# Patient Record
Sex: Female | Born: 1945 | Race: White | Hispanic: No | Smoking: Former smoker
Health system: Southern US, Community
[De-identification: ages and names within clinical notes are randomized; demographics above are authoritative.]

## PROBLEM LIST (undated history)

## (undated) ENCOUNTER — Emergency Department (HOSPITAL_COMMUNITY): Admission: EM | Payer: Medicare HMO | Source: Home / Self Care

## (undated) DIAGNOSIS — R42 Dizziness and giddiness: Secondary | ICD-10-CM

## (undated) DIAGNOSIS — C801 Malignant (primary) neoplasm, unspecified: Secondary | ICD-10-CM

## (undated) DIAGNOSIS — E785 Hyperlipidemia, unspecified: Secondary | ICD-10-CM

## (undated) HISTORY — PX: CARPAL TUNNEL RELEASE: SHX101

## (undated) HISTORY — PX: REPLACEMENT TOTAL HIP W/  RESURFACING IMPLANTS: SUR1222

---

## 2015-06-09 DIAGNOSIS — D126 Benign neoplasm of colon, unspecified: Secondary | ICD-10-CM | POA: Insufficient documentation

## 2015-06-09 DIAGNOSIS — R7301 Impaired fasting glucose: Secondary | ICD-10-CM | POA: Insufficient documentation

## 2015-06-09 DIAGNOSIS — K589 Irritable bowel syndrome without diarrhea: Secondary | ICD-10-CM | POA: Insufficient documentation

## 2015-06-09 DIAGNOSIS — M8580 Other specified disorders of bone density and structure, unspecified site: Secondary | ICD-10-CM | POA: Insufficient documentation

## 2015-06-09 DIAGNOSIS — E785 Hyperlipidemia, unspecified: Secondary | ICD-10-CM | POA: Diagnosis present

## 2015-06-09 DIAGNOSIS — L309 Dermatitis, unspecified: Secondary | ICD-10-CM | POA: Insufficient documentation

## 2015-06-09 DIAGNOSIS — M5137 Other intervertebral disc degeneration, lumbosacral region: Secondary | ICD-10-CM | POA: Insufficient documentation

## 2015-06-09 DIAGNOSIS — E559 Vitamin D deficiency, unspecified: Secondary | ICD-10-CM | POA: Insufficient documentation

## 2015-06-09 DIAGNOSIS — G5600 Carpal tunnel syndrome, unspecified upper limb: Secondary | ICD-10-CM | POA: Insufficient documentation

## 2015-06-09 DIAGNOSIS — K5730 Diverticulosis of large intestine without perforation or abscess without bleeding: Secondary | ICD-10-CM | POA: Insufficient documentation

## 2015-06-09 HISTORY — DX: Vitamin D deficiency, unspecified: E55.9

## 2015-08-21 DIAGNOSIS — M1710 Unilateral primary osteoarthritis, unspecified knee: Secondary | ICD-10-CM | POA: Insufficient documentation

## 2015-09-06 DIAGNOSIS — M5431 Sciatica, right side: Secondary | ICD-10-CM | POA: Insufficient documentation

## 2016-08-01 DIAGNOSIS — Z96649 Presence of unspecified artificial hip joint: Secondary | ICD-10-CM | POA: Insufficient documentation

## 2016-12-01 DIAGNOSIS — L570 Actinic keratosis: Secondary | ICD-10-CM | POA: Insufficient documentation

## 2017-03-06 DIAGNOSIS — J301 Allergic rhinitis due to pollen: Secondary | ICD-10-CM | POA: Insufficient documentation

## 2018-02-02 DIAGNOSIS — R42 Dizziness and giddiness: Secondary | ICD-10-CM | POA: Insufficient documentation

## 2018-02-02 DIAGNOSIS — K2100 Gastro-esophageal reflux disease with esophagitis, without bleeding: Secondary | ICD-10-CM | POA: Diagnosis present

## 2019-02-15 DIAGNOSIS — M25511 Pain in right shoulder: Secondary | ICD-10-CM | POA: Insufficient documentation

## 2021-09-10 ENCOUNTER — Encounter (HOSPITAL_BASED_OUTPATIENT_CLINIC_OR_DEPARTMENT_OTHER): Payer: Self-pay | Admitting: Emergency Medicine

## 2021-09-10 ENCOUNTER — Emergency Department (HOSPITAL_BASED_OUTPATIENT_CLINIC_OR_DEPARTMENT_OTHER)
Admission: EM | Admit: 2021-09-10 | Discharge: 2021-09-10 | Disposition: A | Discharge status: Home / Self Care | Payer: Medicare HMO | Attending: Emergency Medicine | Admitting: Emergency Medicine

## 2021-09-10 ENCOUNTER — Emergency Department (HOSPITAL_BASED_OUTPATIENT_CLINIC_OR_DEPARTMENT_OTHER): Payer: Medicare HMO

## 2021-09-10 DIAGNOSIS — N2889 Other specified disorders of kidney and ureter: Secondary | ICD-10-CM | POA: Diagnosis not present

## 2021-09-10 DIAGNOSIS — M545 Low back pain, unspecified: Secondary | ICD-10-CM | POA: Diagnosis present

## 2021-09-10 DIAGNOSIS — M899 Disorder of bone, unspecified: Secondary | ICD-10-CM | POA: Insufficient documentation

## 2021-09-10 DIAGNOSIS — M5442 Lumbago with sciatica, left side: Secondary | ICD-10-CM | POA: Insufficient documentation

## 2021-09-10 DIAGNOSIS — R634 Abnormal weight loss: Secondary | ICD-10-CM | POA: Insufficient documentation

## 2021-09-10 DIAGNOSIS — R630 Anorexia: Secondary | ICD-10-CM | POA: Diagnosis not present

## 2021-09-10 DIAGNOSIS — R1084 Generalized abdominal pain: Secondary | ICD-10-CM

## 2021-09-10 HISTORY — DX: Hyperlipidemia, unspecified: E78.5

## 2021-09-10 HISTORY — DX: Dizziness and giddiness: R42

## 2021-09-10 LAB — COMPREHENSIVE METABOLIC PANEL
ALT: 22 U/L (ref 0–44)
AST: 31 U/L (ref 15–41)
Albumin: 3.3 g/dL — ABNORMAL LOW (ref 3.5–5.0)
Alkaline Phosphatase: 205 U/L — ABNORMAL HIGH (ref 38–126)
Anion gap: 10 (ref 5–15)
BUN: 18 mg/dL (ref 8–23)
CO2: 28 mmol/L (ref 22–32)
Calcium: 11.6 mg/dL — ABNORMAL HIGH (ref 8.9–10.3)
Chloride: 97 mmol/L — ABNORMAL LOW (ref 98–111)
Creatinine, Ser: 0.81 mg/dL (ref 0.44–1.00)
GFR, Estimated: >60 mL/min (ref >60)
Glucose, Bld: 132 mg/dL — ABNORMAL HIGH (ref 70–99)
Potassium: 3.7 mmol/L (ref 3.5–5.1)
Sodium: 135 mmol/L (ref 135–145)
Total Bilirubin: 0.6 mg/dL (ref 0.3–1.2)
Total Protein: 7.6 g/dL (ref 6.5–8.1)

## 2021-09-10 LAB — URINALYSIS, ROUTINE W REFLEX MICROSCOPIC
Bilirubin Urine: NEGATIVE
Glucose, UA: NEGATIVE mg/dL
Hgb urine dipstick: NEGATIVE
Ketones, ur: NEGATIVE mg/dL
Leukocytes,Ua: NEGATIVE
Nitrite: NEGATIVE
Protein, ur: NEGATIVE mg/dL
Specific Gravity, Urine: >=1.03 (ref 1.005–1.030)
pH: 5.5 (ref 5.0–8.0)

## 2021-09-10 LAB — CBC WITH DIFFERENTIAL/PLATELET
Abs Immature Granulocytes: 0.05 10*3/uL (ref 0.00–0.07)
Basophils Absolute: 0 10*3/uL (ref 0.0–0.1)
Basophils Relative: 0 %
Eosinophils Absolute: 0.1 10*3/uL (ref 0.0–0.5)
Eosinophils Relative: 1 %
HCT: 34.6 % — ABNORMAL LOW (ref 36.0–46.0)
Hemoglobin: 10.8 g/dL — ABNORMAL LOW (ref 12.0–15.0)
Immature Granulocytes: 1 %
Lymphocytes Relative: 13 %
Lymphs Abs: 1.4 10*3/uL (ref 0.7–4.0)
MCH: 24.7 pg — ABNORMAL LOW (ref 26.0–34.0)
MCHC: 31.2 g/dL (ref 30.0–36.0)
MCV: 79.2 fL — ABNORMAL LOW (ref 80.0–100.0)
Monocytes Absolute: 0.4 10*3/uL (ref 0.1–1.0)
Monocytes Relative: 4 %
Neutro Abs: 8.9 10*3/uL — ABNORMAL HIGH (ref 1.7–7.7)
Neutrophils Relative %: 81 %
Platelets: 400 10*3/uL (ref 150–400)
RBC: 4.37 MIL/uL (ref 3.87–5.11)
RDW: 13.5 % (ref 11.5–15.5)
WBC: 10.9 10*3/uL — ABNORMAL HIGH (ref 4.0–10.5)
nRBC: 0 % (ref 0.0–0.2)

## 2021-09-10 LAB — LIPASE, BLOOD: Lipase: 20 U/L (ref 11–51)

## 2021-09-10 IMAGING — US US PELVIS COMPLETE TRANSABD/TRANSVAG W DUPLEX AND/OR DOPPLER
1 series · 13 of 25 positions shown · non-contrast
Comparison: None Available.

CLINICAL DATA: Abnormal uterus on recent CT

EXAM:
TRANSABDOMINAL AND TRANSVAGINAL ULTRASOUND OF PELVIS
DOPPLER ULTRASOUND OF OVARIES
TECHNIQUE: Both transabdominal and transvaginal ultrasound examinations of the
pelvis were performed. Transabdominal technique was performed for
global imaging of the pelvis including uterus, ovaries, adnexal
regions, and pelvic cul-de-sac.
It was necessary to proceed with endovaginal exam following the
transabdominal exam to visualize the ovaries. Color and duplex
Doppler ultrasound was utilized to evaluate blood flow to the
ovaries.

[Series 1: us pelvis complete transabd/transvag w duplex and/ · 13 of 71 slices shown]
[im 1/71]
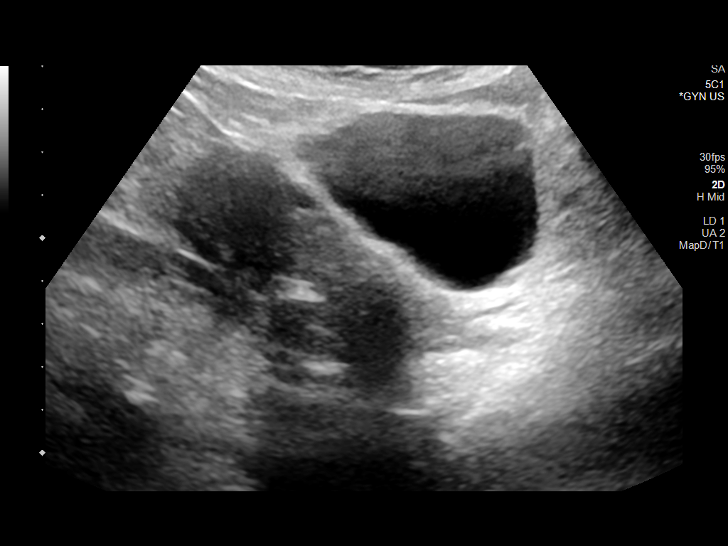
[im 6/71]
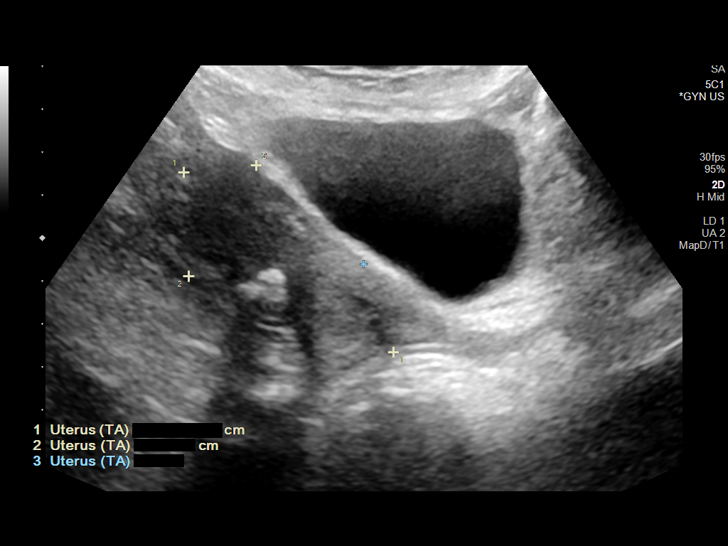
[im 12/71]
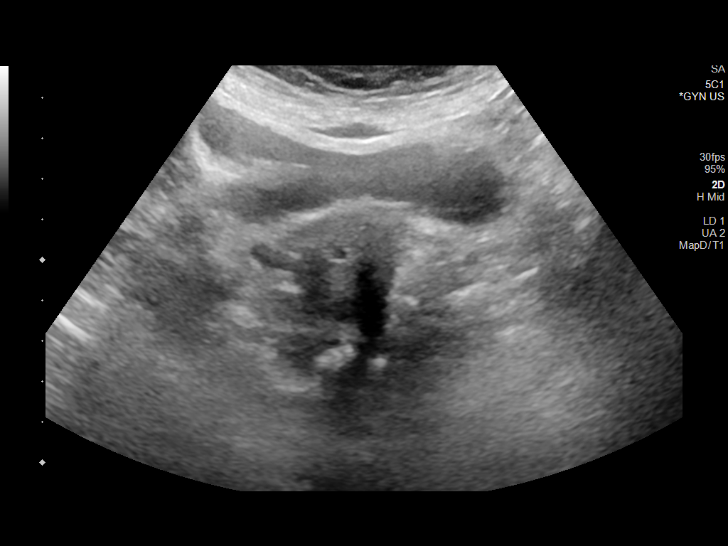
[im 18/71]
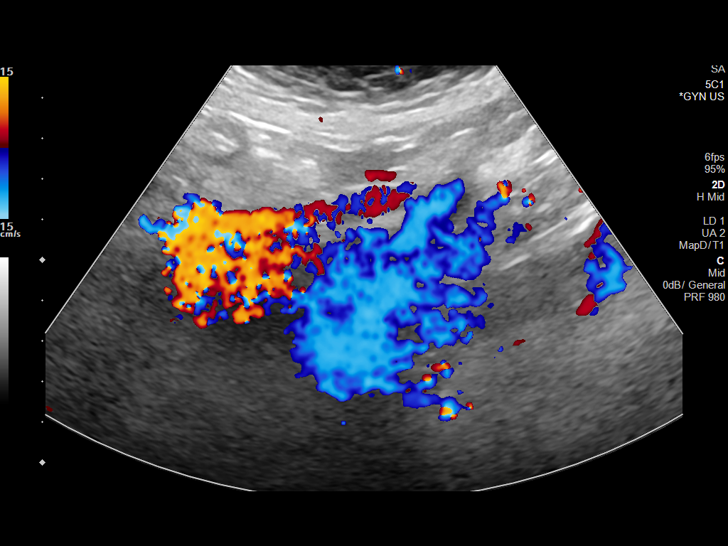
[im 24/71]
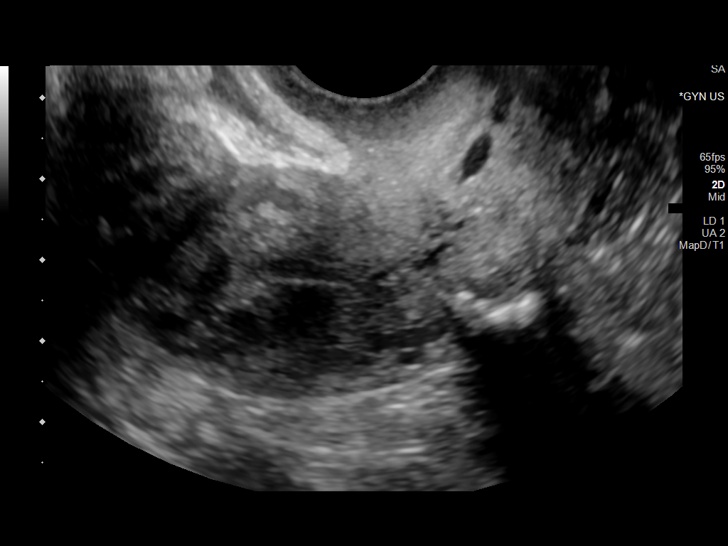
[im 30/71]
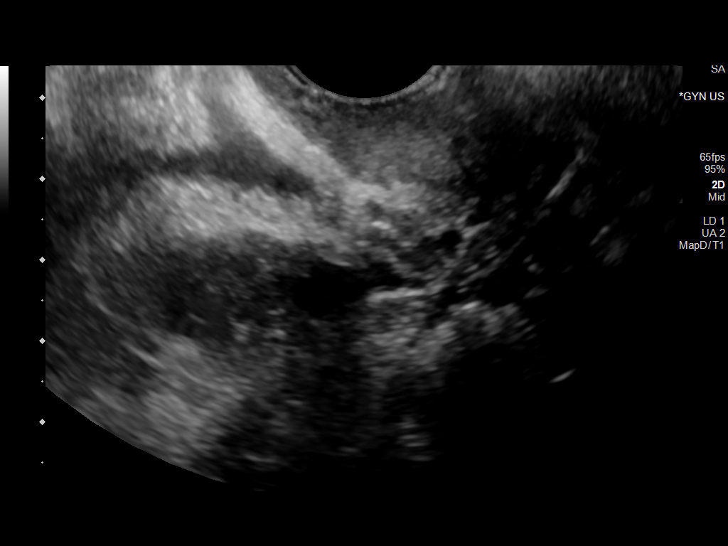
[im 36/71]
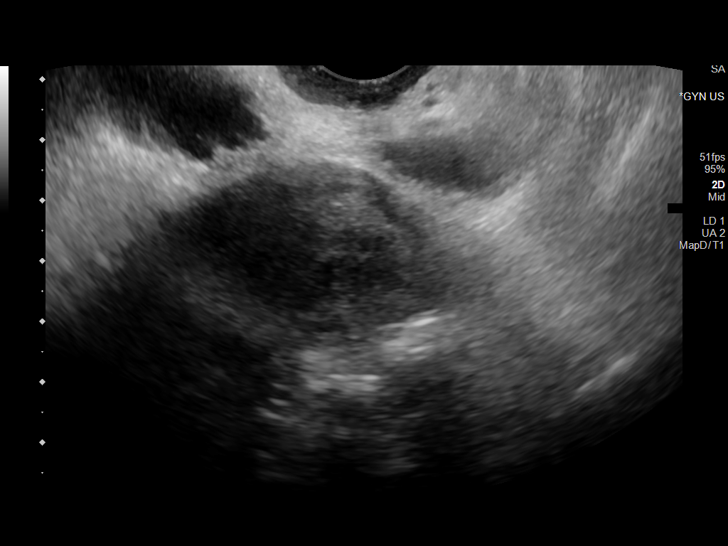
[im 41/71]
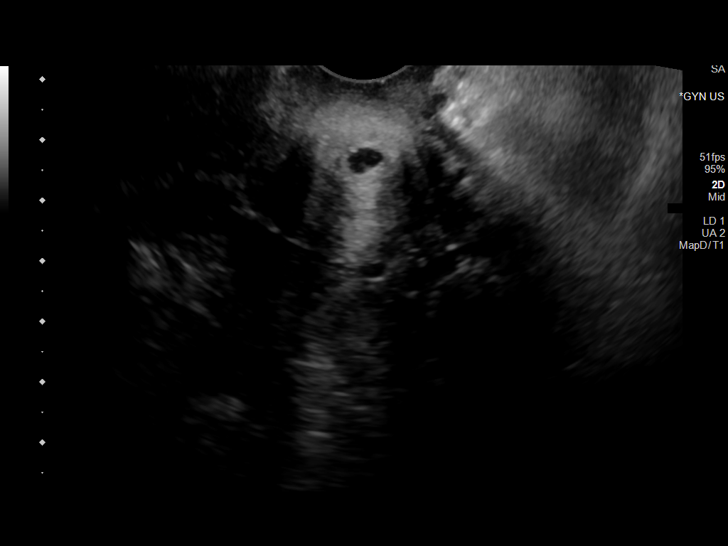
[im 47/71]
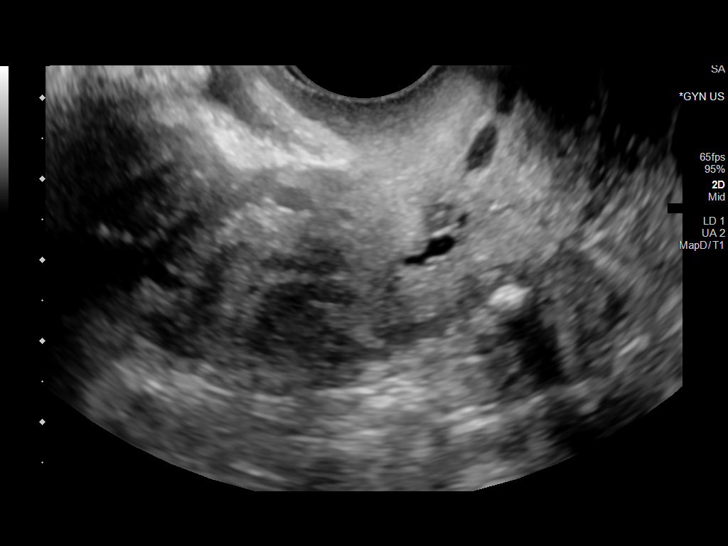
[im 53/71]
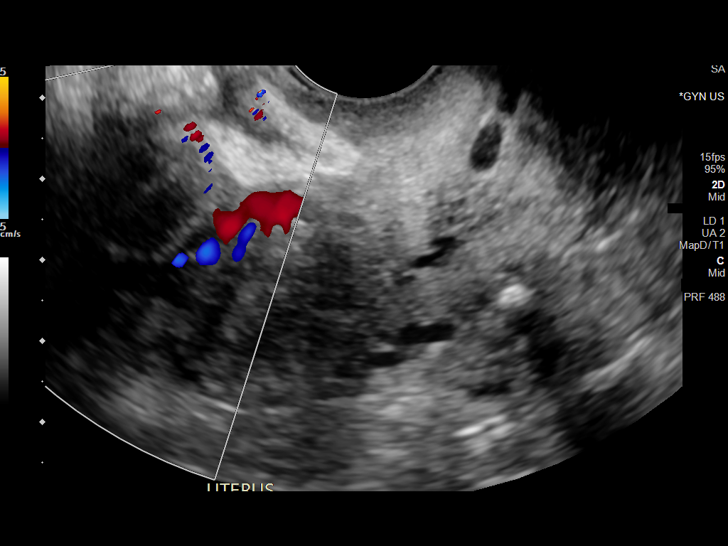
[im 59/71]
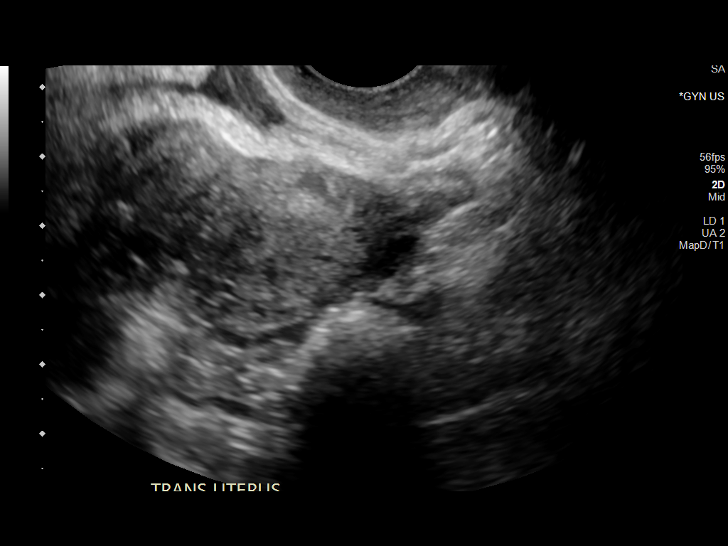
[im 65/71]
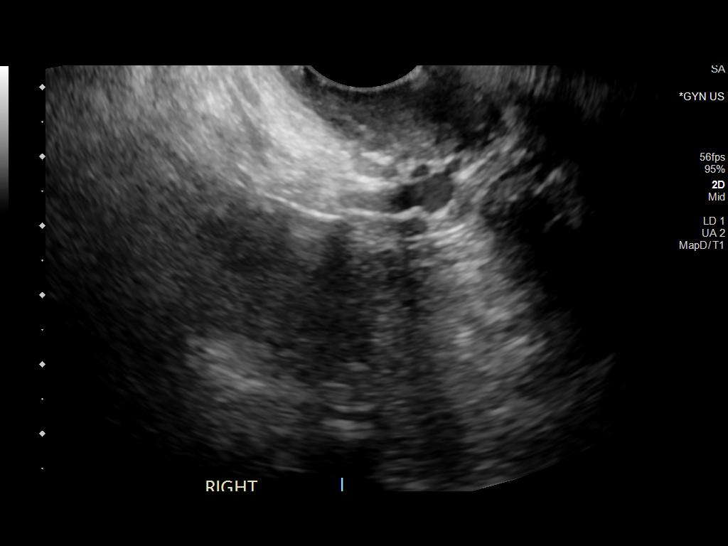
[im 71/71]
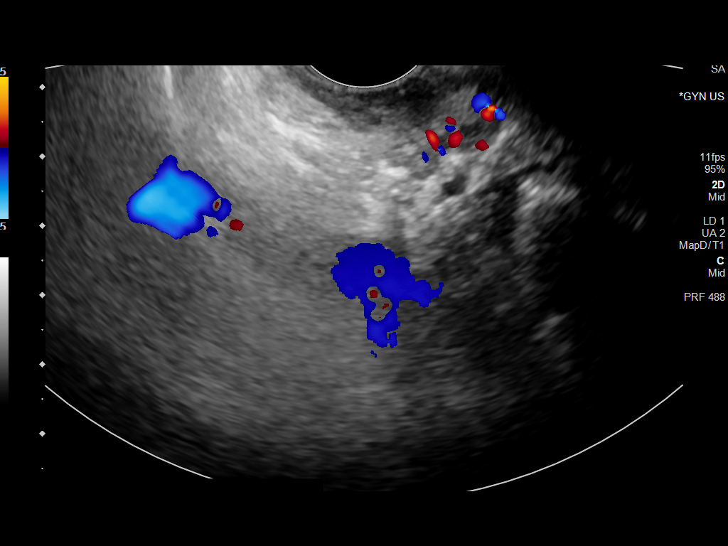

[13 of 25 positions shown; findings below may reference images not displayed]

FINDINGS: Uterus

Measurements: 5.9 x 2.9 x 5.0 cm. = volume: 45 mL. Diffusely
heterogeneous with calcified lesions consistent with small fibroids.
Largest of these measures 2.5 cm in the fundus.

Endometrium

Thickness: 4 mm. No discrete abnormality to correspond with the hypo
dense region seen on recent CT.

Right ovary

Not visualized

Left ovary

Not visualized

Pulsed Doppler evaluation of both ovaries demonstrates normal
low-resistance arterial and venous waveforms.

Other findings

No abnormal free fluid.
IMPRESSION: Uterine fibroid change.

No endometrial abnormality is identified to correspond with that
seen on recent CT examination

.

## 2021-09-10 IMAGING — DX DG CHEST 2V
2 series · 2 of 2 positions shown · non-contrast
Comparison: None Available.

CLINICAL DATA: Abdominal pain, decreased appetite

EXAM:
CHEST - 2 VIEW

[chest pa]
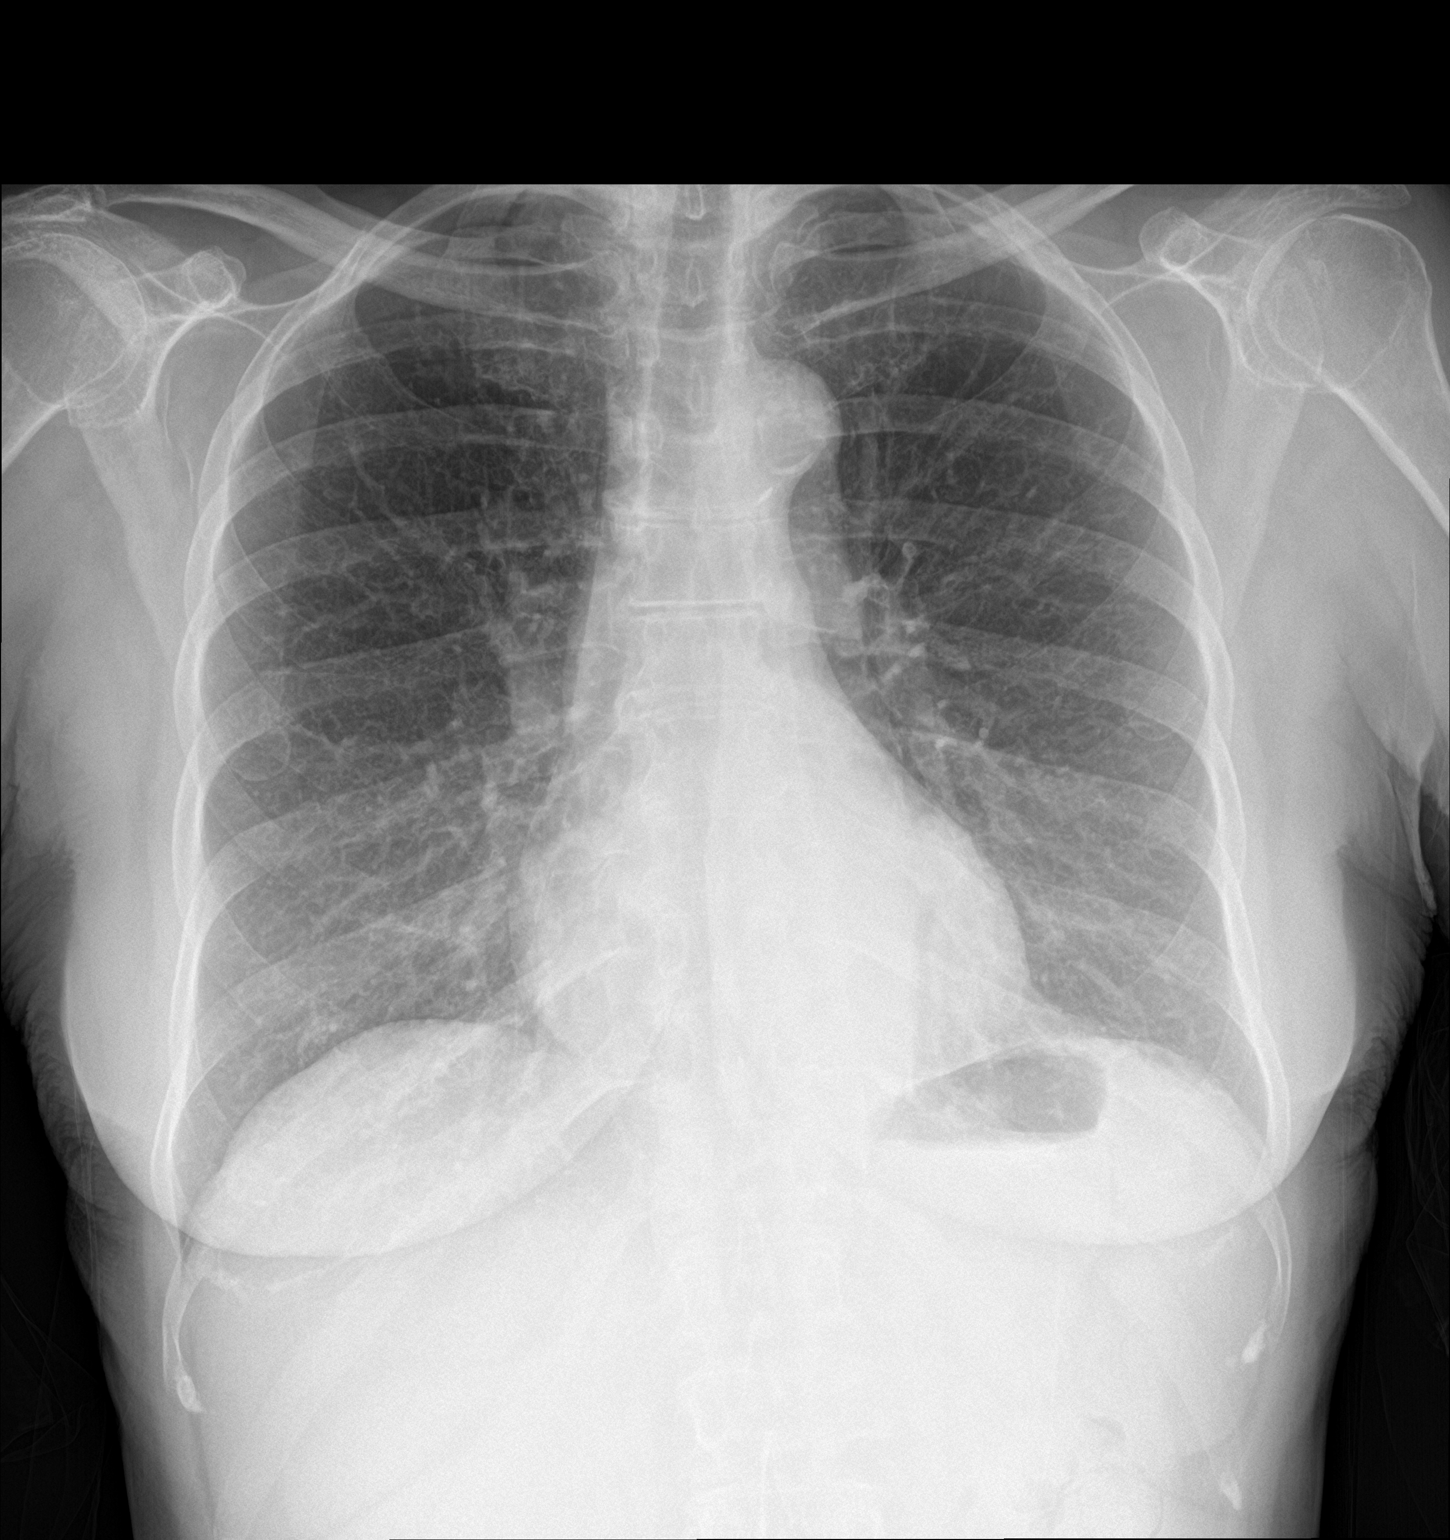

[chest lat]
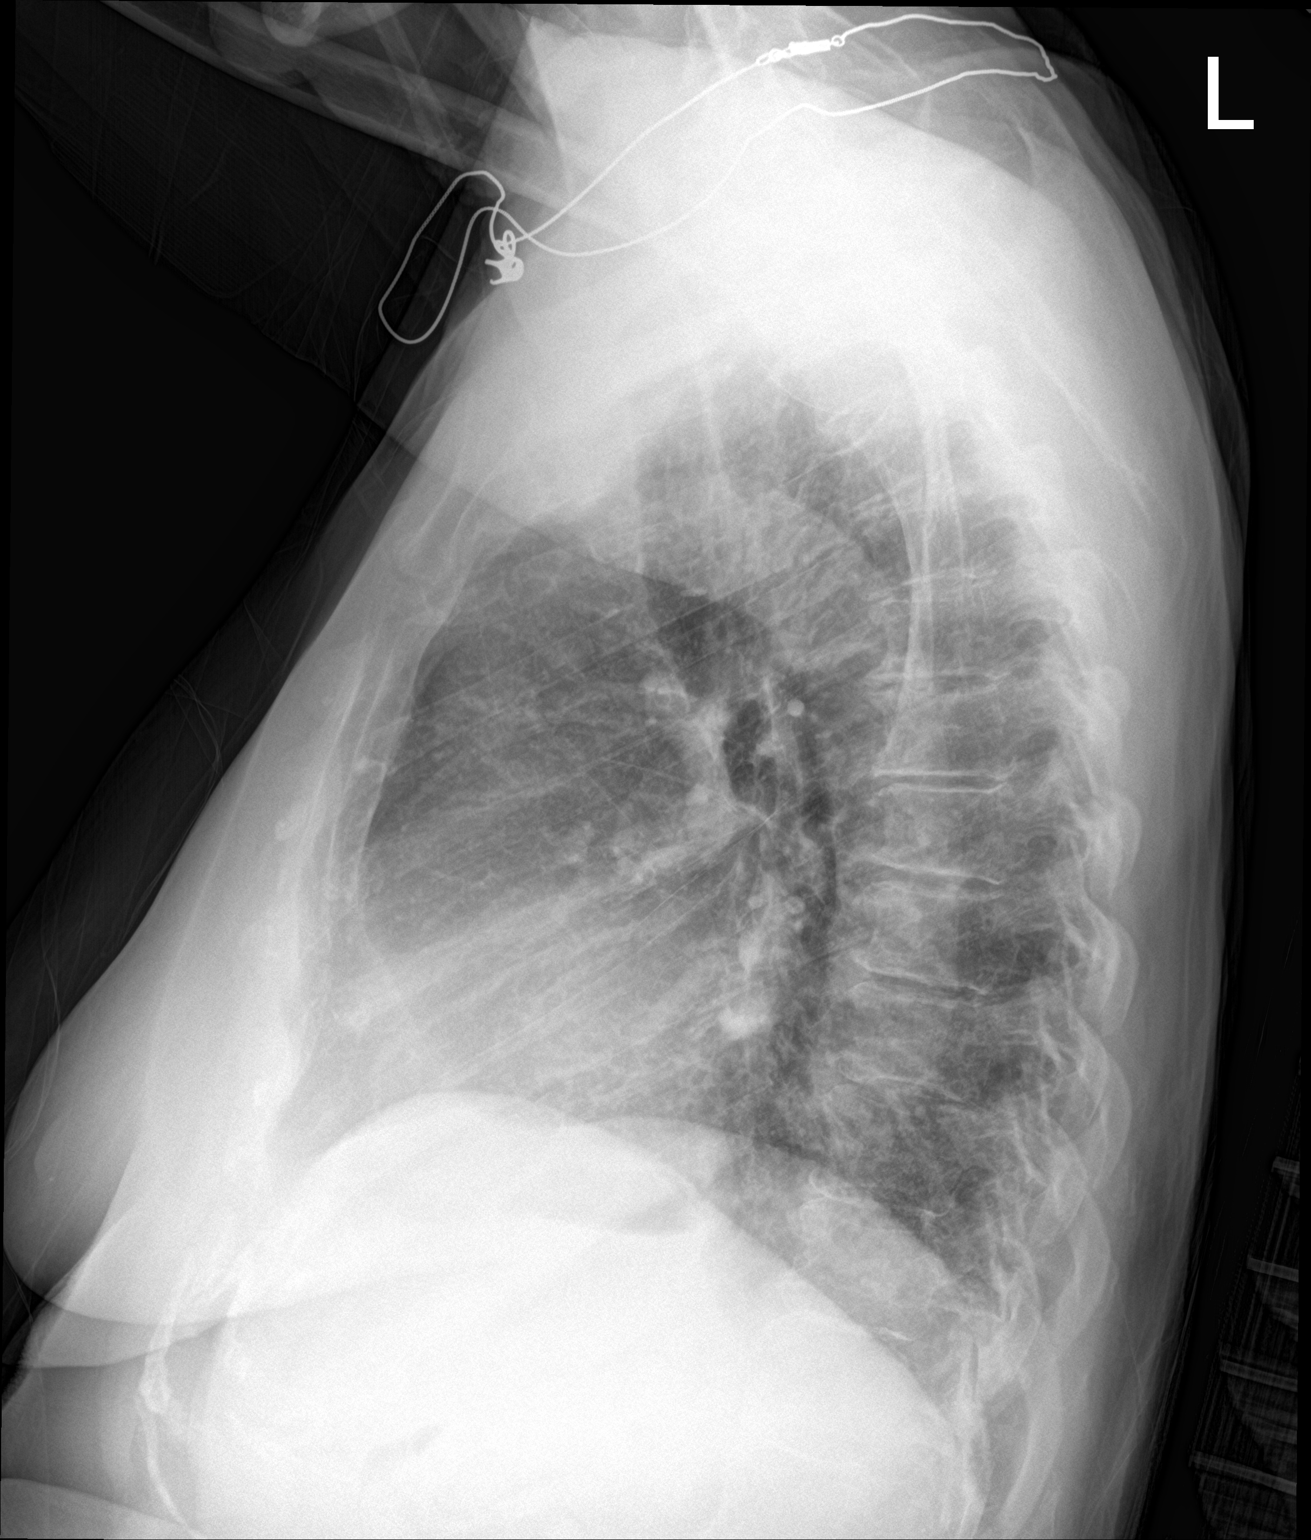

[2 of 2 positions shown; findings below may reference images not displayed]

FINDINGS: The heart size and mediastinal contours are within normal limits.
Both lungs are clear. The visualized skeletal structures are
unremarkable.
IMPRESSION: No active cardiopulmonary disease.

## 2021-09-10 IMAGING — CT CT ABD-PELV W/ CM
2 of 5 series · 14 of 46 positions shown, 16 images · IV contrast (Omnipaque)
Comparison: None Available.

CLINICAL DATA: Severe abdominal pain and nausea.

* Tracking Code: BO *
EXAM:
CT ABDOMEN AND PELVIS WITH CONTRAST
TECHNIQUE: Multidetector CT imaging of the abdomen and pelvis was performed
using the standard protocol following bolus administration of
intravenous contrast.

[Series 3: axial (person_name) (person_name) · axial · 0.86mm/px · z∈[-566,-142]mm · 11 of 97 slices shown, 13 images]
[im 6/97  soft-tissue]
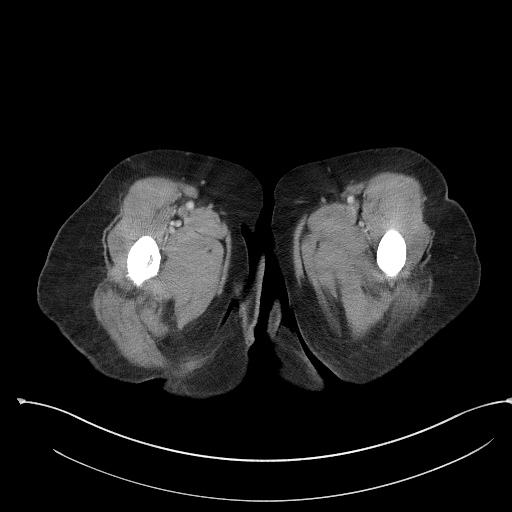
[im 6/97  bone]
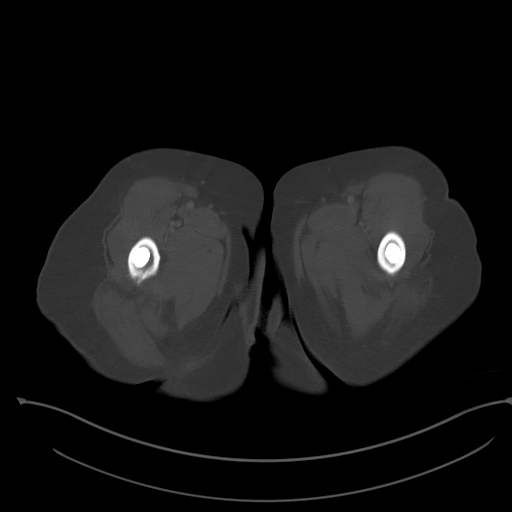
[im 16/97  soft-tissue]
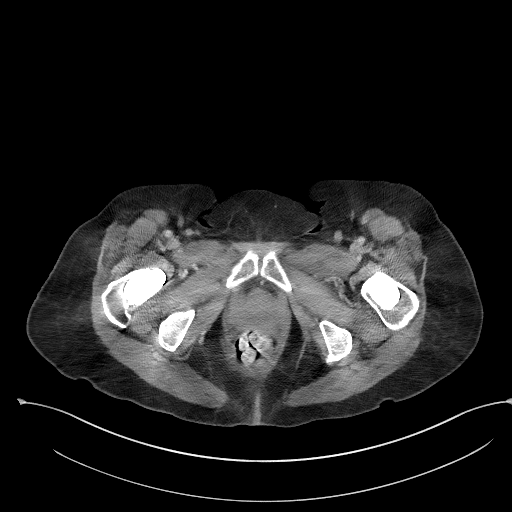
[im 26/97  soft-tissue]
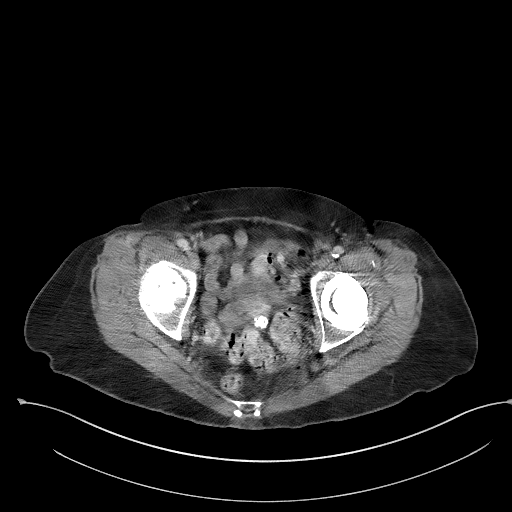
[im 31/97  soft-tissue]
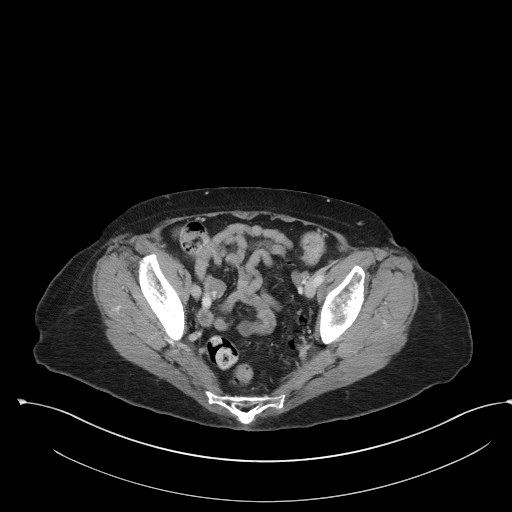
[im 41/97  soft-tissue]
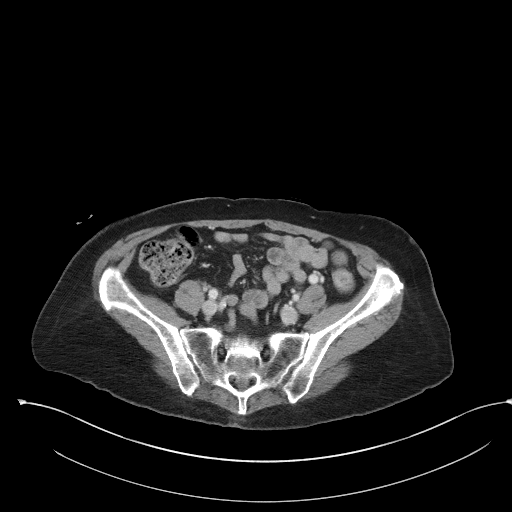
[im 51/97  soft-tissue]
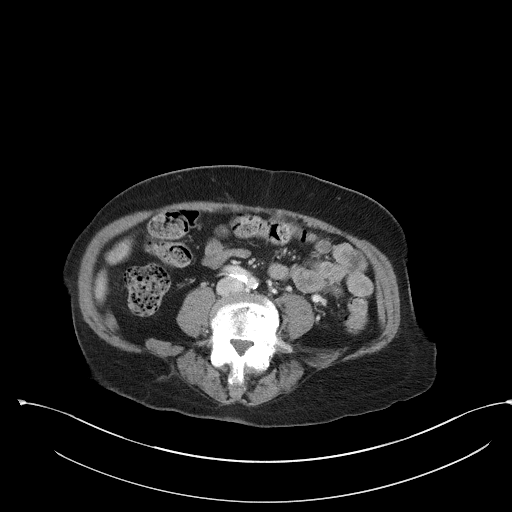
[im 56/97  soft-tissue]
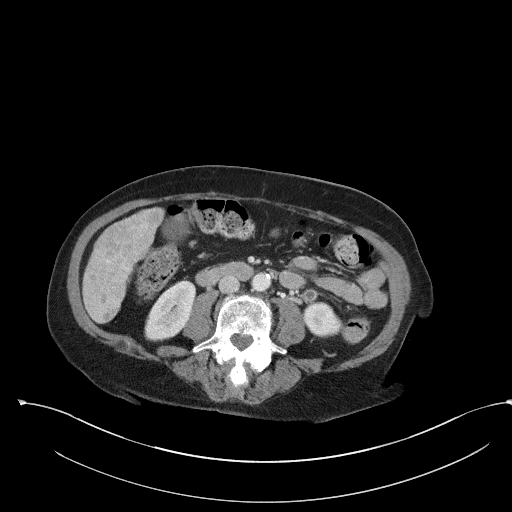
[im 66/97  soft-tissue]
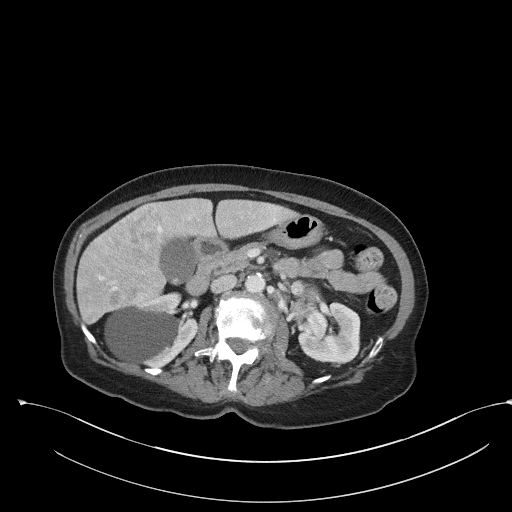
[im 71/97  soft-tissue]
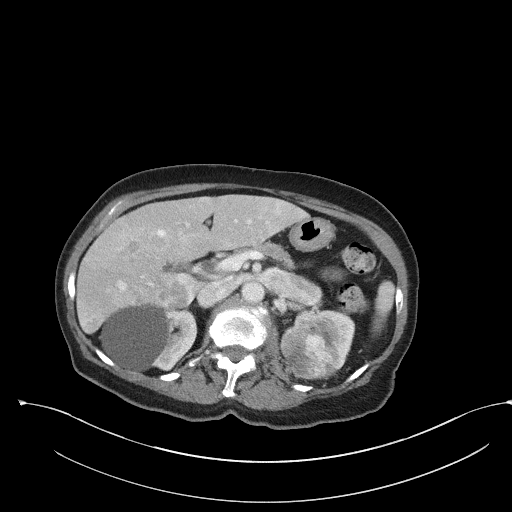
[im 71/97  bone]
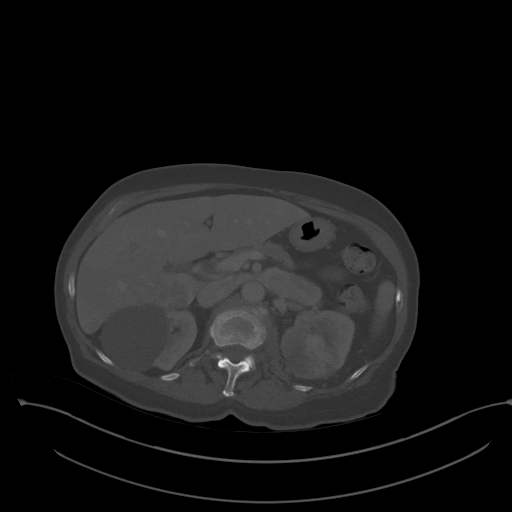
[im 81/97  soft-tissue]
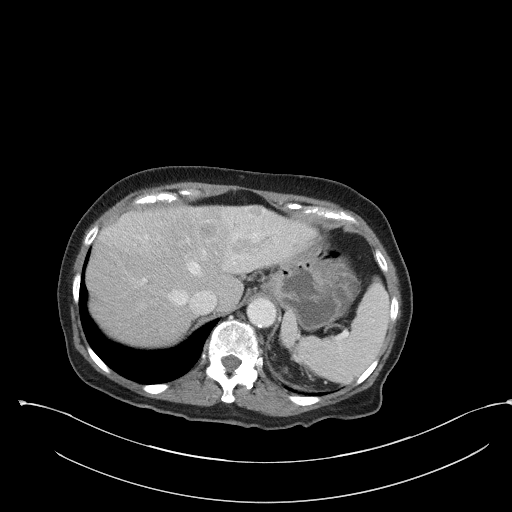
[im 91/97  soft-tissue]
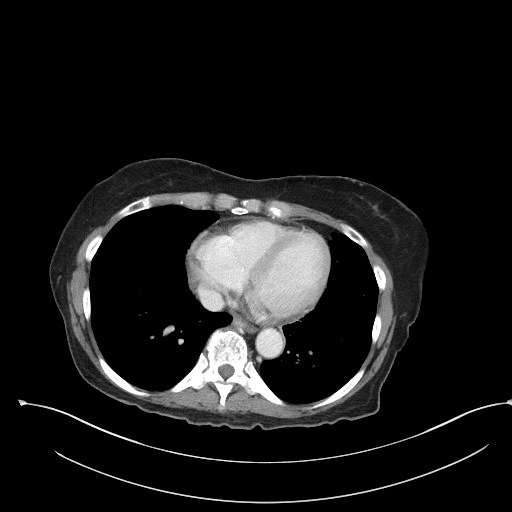

[Series 6: coronal st · coronal · 0.64mm/px · 3 of 98 slices shown]
[im 33/98  soft-tissue]
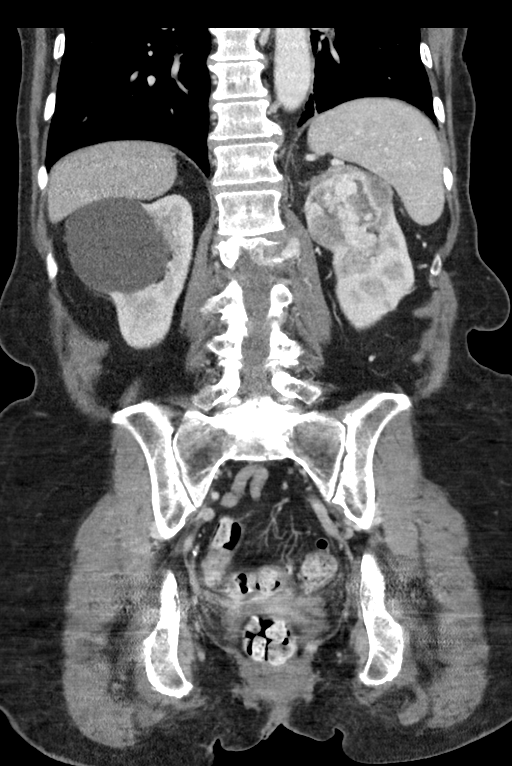
[im 44/98  soft-tissue]
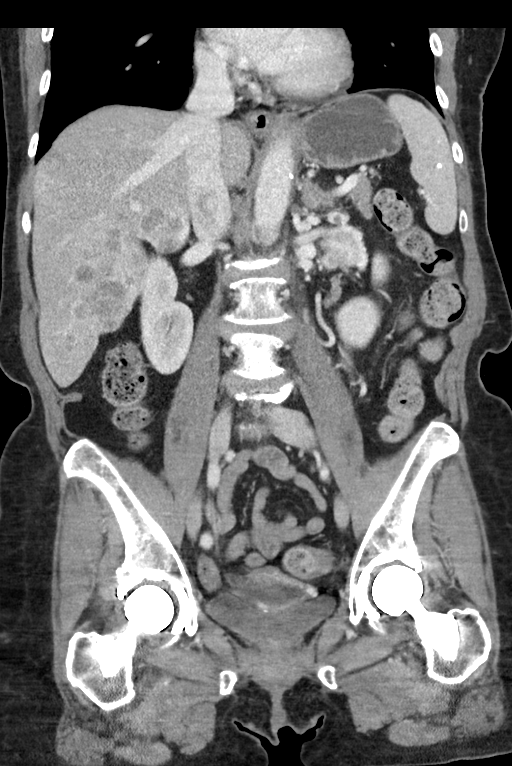
[im 54/98  soft-tissue]
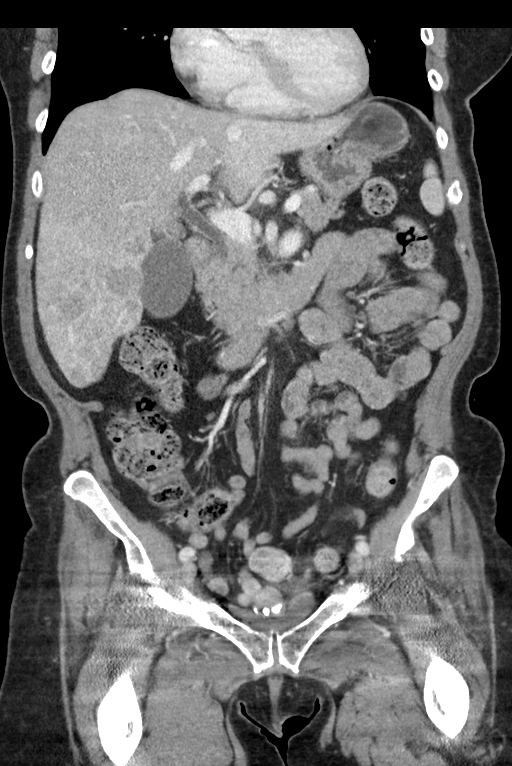

[14 of 46 positions shown; findings below may reference images not displayed]

RADIATION DOSE REDUCTION: This exam was performed according to the
departmental dose-optimization program which includes automated
exposure control, adjustment of the mA and/or kV according to
patient size and/or use of iterative reconstruction technique.

CONTRAST:  100mL OMNIPAQUE IOHEXOL 300 MG/ML  SOLN
FINDINGS: Lower Chest: No acute findings.

Hepatobiliary: Multiple hypovascular masses with peripheral rim
enhancement are seen throughout the liver, consistent with diffuse
liver metastases. One index inferior right hepatic lobe measures
x 2.7 cm on image 37/3. Another index lesion in the left hepatic
lobe measures 2.6 x 2.2 cm on image [DATE].

Small calcified gallstone is noted, however there is no evidence of
cholecystitis or biliary ductal dilatation.

Pancreas:  No mass or inflammatory changes.

Spleen: Within normal limits in size and appearance.

Adrenals/Urinary Tract: 5.7 x 5.8 cm heterogeneously enhancing mass
in upper pole of left kidney, consistent with renal cell carcinoma.
Soft tissue nodules are seen within a left posterior perinephric
space, largest measuring 2.0 x 1.4 cm. Tumor thrombus is also seen
extending into the left renal vein which extends to the midline.
Mild nonenhancing thrombus seen in the distal left gonadal vein. No
evidence of IVC tumor thrombus. 6.6 cm benign-appearing right renal
cyst also noted.

Stomach/Bowel: No evidence of obstruction, inflammatory process or
abnormal fluid collections. Diverticulosis is seen mainly involving
the sigmoid colon, however there is no evidence of diverticulitis.

Vascular/Lymphatic: No pathologically enlarged lymph nodes. No acute
vascular findings. Aortic atherosclerotic calcification incidentally
noted.

Reproductive: Image resolution is limited by artifact from bilateral
hip prostheses. Several small calcified uterine fibroids are noted.
Central low attenuation is also seen within the endometrial cavity
may represent diffuse endometrial thickening.

Other:  None.

Musculoskeletal: Lytic bone lesion involving L2 vertebral body and
pedicle, consistent with bone metastasis
IMPRESSION: 5.8 cm left upper pole renal mass, consistent with renal cell
carcinoma. Adjacent soft tissue nodules within a left posterior
perinephric space.

Tumor thrombus in the left renal vein, with mild bland thrombus in
distal left gonadal vein.

Diffuse liver metastases.

Lytic bone metastasis involving the L2 vertebra.

Cholelithiasis. No radiographic evidence of cholecystitis.

Colonic diverticulosis, without radiographic evidence of
diverticulitis.

Small calcified uterine fibroids.

Possible diffuse endometrial thickening although visualization
limited by artifact from hip prostheses. Endometrial carcinoma
cannot be excluded in a postmenopausal patient. Recommend
transvaginal pelvic ultrasound for further evaluation.

Aortic Atherosclerosis ([5I]-[5I]).

## 2021-09-10 MED ORDER — SODIUM CHLORIDE 0.9 % IV BOLUS
1000.0000 mL | Freq: Once | INTRAVENOUS | Status: AC
  Administered 2021-09-10: 1000 mL via INTRAVENOUS

## 2021-09-10 MED ORDER — SODIUM CHLORIDE 0.9 % IV SOLN: INTRAVENOUS | Status: DC

## 2021-09-10 MED ORDER — HYDROMORPHONE HCL 1 MG/ML IJ SOLN
1.0000 mg | Freq: Once | INTRAMUSCULAR | Status: AC
  Administered 2021-09-10: 1 mg via INTRAVENOUS
  Filled 2021-09-10: qty 1

## 2021-09-10 MED ORDER — ONDANSETRON 4 MG PO TBDP
4.0000 mg | ORAL_TABLET | Freq: Once | ORAL | Status: DC | PRN
Start: 2021-09-10 — End: 2021-09-11

## 2021-09-10 MED ORDER — IOHEXOL 300 MG/ML  SOLN
100.0000 mL | Freq: Once | INTRAMUSCULAR | Status: AC | PRN
  Administered 2021-09-10: 100 mL via INTRAVENOUS

## 2021-09-10 MED ORDER — HYDROCODONE-ACETAMINOPHEN 5-325 MG PO TABS: 1.0000 | ORAL_TABLET | Freq: Four times a day (QID) | ORAL | 0 refills | Status: DC | PRN

## 2021-09-10 MED ORDER — ONDANSETRON 4 MG PO TBDP
4.0000 mg | ORAL_TABLET | Freq: Once | ORAL | Status: AC | PRN
  Administered 2021-09-10: 4 mg via ORAL
  Filled 2021-09-10: qty 1

## 2021-09-10 MED ORDER — ONDANSETRON 4 MG PO TBDP: 4.0000 mg | ORAL_TABLET | Freq: Three times a day (TID) | ORAL | 1 refills | Status: DC | PRN

## 2021-09-10 MED ORDER — ONDANSETRON HCL 4 MG/2ML IJ SOLN
4.0000 mg | Freq: Once | INTRAMUSCULAR | Status: AC
  Administered 2021-09-10: 4 mg via INTRAVENOUS
  Filled 2021-09-10: qty 2

## 2021-09-10 NOTE — ED Notes (Signed)
EDP at bedside  

## 2021-09-10 NOTE — Discharge Instructions (Signed)
[facility] oncology service should be contacting you for follow-up biopsy and staging.  Take the pain medicine as needed.  Can take 1 or 2 tablets of the hydrocodone.  Take the Zofran as needed for nausea and vomiting.  He had a refill on that.  Return for any new or worse symptoms ?

## 2021-09-10 NOTE — ED Triage Notes (Signed)
Severe BL leg pain, lower back pain, and lower abdominal pain. Endorses nausea. No vomiting. Decreased PO intake.  ?

## 2021-09-10 NOTE — ED Provider Notes (Addendum)
?[facility] ?Provider Note ? ? ?CSN: [id number] ?Arrival date & time: 09/10/21  1657 ? ?  ? ?History ? ?Chief Complaint  ?Patient presents with  ? Abdominal Pain  ? Leg Pain  ? Back Pain  ? ? ?Jane Kennedy is a 76 y.o. female. ? ?Patient with a complaint of low back pain bilateral but worse on the left with radiation into the left thigh for 3 weeks.  Primary care doctor treated her with Celexa and lidocaine patches did do an x-ray of both hips as well as her lumbar back.  And apparently showed some degenerative changes otherwise normal.  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  Also at the same time patient's had abdominal pain for 3 weeks kind of periumbilical in nature has a history of high BS.  But she states that this abdominal pain is gotten significantly worse.  Associated with persistent nausea but no vomiting no diarrhea.  Also has had some weight loss and has had decreased p.o. intake.  Patient not on any narcotic pain medicine.  Patient denies any numbness or weakness to either lower extremity certainly to the left no numbness or weakness to the foot. ? ?Past medical history significant for vertigo which she does not take medicines for she has had that for many years.  And has a history of hyperlipidemia.  Past surgical history is none.  Patient never smoked. ? ? ?  ? ?Home Medications ?Prior to Admission medications   ?Not on File  ?   ? ?Allergies    ?Codeine   ? ?Review of Systems   ?Review of Systems  ?Constitutional:  Positive for appetite change and unexpected weight change. Negative for chills and fever.  ?HENT:  Negative for ear pain and sore throat.   ?Eyes:  Negative for pain and visual disturbance.  ?Respiratory:  Negative for cough and shortness of breath.   ?Cardiovascular:  Negative for chest pain and palpitations.  ?Gastrointestinal:  Positive for abdominal pain and nausea. Negative for vomiting.  ?Genitourinary:  Negative for dysuria and hematuria.  ?Musculoskeletal:   Positive for back pain. Negative for arthralgias.  ?Skin:  Negative for color change and rash.  ?Neurological:  Negative for seizures and syncope.  ?All other systems reviewed and are negative. ? ?Physical Exam ?Updated Vital Signs ?BP (!) 147/85   Pulse 93   Temp 98.2 ?F (36.8 ?C) (Oral)   Resp 18   SpO2 100%  ?Physical Exam ?Vitals and nursing note reviewed.  ?Constitutional:   ?   General: She is not in acute distress. ?   Appearance: She is well-developed.  ?HENT:  ?   Head: Normocephalic and atraumatic.  ?Eyes:  ?   Extraocular Movements: Extraocular movements intact.  ?   Conjunctiva/sclera: Conjunctivae normal.  ?   Pupils: Pupils are equal, round, and reactive to light.  ?Cardiovascular:  ?   Rate and Rhythm: Normal rate and regular rhythm.  ?   Heart sounds: No murmur heard. ?Pulmonary:  ?   Effort: Pulmonary effort is normal. No respiratory distress.  ?   Breath sounds: Normal breath sounds.  ?Abdominal:  ?   General: There is no distension.  ?   Palpations: Abdomen is soft.  ?   Tenderness: There is no abdominal tenderness.  ?   Hernia: No hernia is present.  ?Musculoskeletal:     ?   General: No swelling.  ?   Cervical back: Neck supple.  ?Skin: ?   General: Skin  is warm and dry.  ?   Capillary Refill: Capillary refill takes less than 2 seconds.  ?Neurological:  ?   General: No focal deficit present.  ?   Mental Status: She is alert and oriented to person, place, and time.  ?   Cranial Nerves: No cranial nerve deficit.  ?   Motor: No weakness.  ?Psychiatric:     ?   Mood and Affect: Mood normal.  ? ? ?ED Results / Procedures / Treatments   ?Labs ?(all labs ordered are listed, but only abnormal results are displayed) ?Labs Reviewed  ?COMPREHENSIVE METABOLIC PANEL - Abnormal; Notable for the following components:  ?    Result Value  ? Chloride 97 (*)   ? Glucose, Bld 132 (*)   ? Calcium 11.6 (*)   ? Albumin 3.3 (*)   ? Alkaline Phosphatase 205 (*)   ? All other components within normal limits   ?URINALYSIS, ROUTINE W REFLEX MICROSCOPIC - Abnormal; Notable for the following components:  ? APPearance HAZY (*)   ? All other components within normal limits  ?CBC WITH DIFFERENTIAL/PLATELET - Abnormal; Notable for the following components:  ? WBC 10.9 (*)   ? Hemoglobin 10.8 (*)   ? HCT 34.6 (*)   ? MCV 79.2 (*)   ? MCH 24.7 (*)   ? Neutro Abs 8.9 (*)   ? All other components within normal limits  ?LIPASE, BLOOD  ? ? ?EKG ?None ? ?Radiology ?DG Chest 2 View ? ?Result Date: 09/10/2021 ?CLINICAL DATA:  Abdominal pain, decreased appetite EXAM: CHEST - 2 VIEW COMPARISON:  None Available. FINDINGS: The heart size and mediastinal contours are within normal limits. Both lungs are clear. The visualized skeletal structures are unremarkable. IMPRESSION: No active cardiopulmonary disease. Electronically Signed   By: Elmer Picker M.D.   On: 09/10/2021 19:27  ? ?CT Abdomen Pelvis W Contrast ? ?Result Date: 09/10/2021 ?CLINICAL DATA:  Severe abdominal pain and nausea. * Tracking Code: BO * EXAM: CT ABDOMEN AND PELVIS WITH CONTRAST TECHNIQUE: Multidetector CT imaging of the abdomen and pelvis was performed using the standard protocol following bolus administration of intravenous contrast. RADIATION DOSE REDUCTION: This exam was performed according to the departmental dose-optimization program which includes automated exposure control, adjustment of the mA and/or kV according to patient size and/or use of iterative reconstruction technique. CONTRAST:  174m OMNIPAQUE IOHEXOL 300 MG/ML  SOLN COMPARISON:  None Available. FINDINGS: Lower Chest: No acute findings. Hepatobiliary: Multiple hypovascular masses with peripheral rim enhancement are seen throughout the liver, consistent with diffuse liver metastases. One index inferior right hepatic lobe measures 4.0 x 2.7 cm on image 37/3. Another index lesion in the left hepatic lobe measures 2.6 x 2.2 cm on image 25/3. Small calcified gallstone is noted, however there is no  evidence of cholecystitis or biliary ductal dilatation. Pancreas:  No mass or inflammatory changes. Spleen: Within normal limits in size and appearance. Adrenals/Urinary Tract: 5.7 x 5.8 cm heterogeneously enhancing mass in upper pole of left kidney, consistent with renal cell carcinoma. Soft tissue nodules are seen within a left posterior perinephric space, largest measuring 2.0 x 1.4 cm. Tumor thrombus is also seen extending into the left renal vein which extends to the midline. Mild nonenhancing thrombus seen in the distal left gonadal vein. No evidence of IVC tumor thrombus. 6.6 cm benign-appearing right renal cyst also noted. Stomach/Bowel: No evidence of obstruction, inflammatory process or abnormal fluid collections. Diverticulosis is seen mainly involving the sigmoid colon, however there is  no evidence of diverticulitis. Vascular/Lymphatic: No pathologically enlarged lymph nodes. No acute vascular findings. Aortic atherosclerotic calcification incidentally noted. Reproductive: Image resolution is limited by artifact from bilateral hip prostheses. Several small calcified uterine fibroids are noted. Central low attenuation is also seen within the endometrial cavity may represent diffuse endometrial thickening. Other:  None. Musculoskeletal: Lytic bone lesion involving L2 vertebral body and pedicle, consistent with bone metastasis IMPRESSION: 5.8 cm left upper pole renal mass, consistent with renal cell carcinoma. Adjacent soft tissue nodules within a left posterior perinephric space. Tumor thrombus in the left renal vein, with mild bland thrombus in distal left gonadal vein. 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 Cholelithiasis. No radiographic evidence of cholecystitis. Colonic diverticulosis, without radiographic evidence of diverticulitis. Small calcified uterine fibroids. Possible diffuse endometrial thickening although visualization limited by artifact from hip  prostheses. Endometrial carcinoma cannot be excluded in a postmenopausal patient. Recommend transvaginal pelvic ultrasound for further evaluation. Aortic Atherosclerosis (ICD10-I70.0). Electronically Signed   By: Max Sane

## 2021-09-11 ENCOUNTER — Encounter: Payer: Self-pay | Admitting: *Deleted

## 2021-09-11 NOTE — Progress Notes (Signed)
Received request from Dr Benay Spice to have this patient scheduled with Dr Marin Olp.  ? ?Reached out to Phoebe Perch to introduce myself as the office RN Navigator and explain our new patient process. Reviewed the reason for their referral and scheduled their new patient appointment along with labs. Provided address and directions to the office including call back phone number. Reviewed with patient any concerns they may have or any possible barriers to attending their appointment.  ? ?Informed patient about my role as a navigator and that I will meet with them prior to their New Patient appointment and more fully discuss what services I can provide. At this time patient has no further questions or needs.   ? ?Oncology Nurse Navigator Documentation ? ? ?  09/11/2021  ?  8:00 AM  ?Oncology Nurse Navigator Flowsheets  ?Abnormal Finding Date 09/10/2021  ?Diagnosis Status Additional Work Up  ?Navigator Follow Up Date: 09/16/2021  ?Navigator Follow Up Reason: New Patient Appointment  ?Navigator Location [facility]  ?Referral Date to RadOnc/MedOnc 09/10/2021  ?Navigator Encounter Type Introductory Phone Call  ?Patient Visit Type MedOnc  ?Treatment Phase Abnormal Scans  ?Barriers/Navigation Needs Coordination of Care;Education  ?Education Other  ?Interventions Coordination of Care;Education  ?Acuity Level 2-Minimal Needs (1-2 Barriers Identified)  ?Coordination of Care Appts  ?Education Method Verbal;Teach-back  ?Support Groups/Services Friends and Family  ?Time Spent with Patient 30  ?  ?

## 2021-09-16 ENCOUNTER — Other Ambulatory Visit: Payer: Self-pay

## 2021-09-16 ENCOUNTER — Encounter: Payer: Self-pay | Admitting: *Deleted

## 2021-09-16 ENCOUNTER — Inpatient Hospital Stay: Payer: Medicare HMO | Attending: Hematology & Oncology

## 2021-09-16 ENCOUNTER — Inpatient Hospital Stay (HOSPITAL_BASED_OUTPATIENT_CLINIC_OR_DEPARTMENT_OTHER): Payer: Medicare HMO | Admitting: Hematology & Oncology

## 2021-09-16 ENCOUNTER — Encounter: Payer: Self-pay | Admitting: Hematology & Oncology

## 2021-09-16 DIAGNOSIS — C7951 Secondary malignant neoplasm of bone: Secondary | ICD-10-CM | POA: Diagnosis not present

## 2021-09-16 DIAGNOSIS — R52 Pain, unspecified: Secondary | ICD-10-CM | POA: Diagnosis not present

## 2021-09-16 DIAGNOSIS — N2889 Other specified disorders of kidney and ureter: Secondary | ICD-10-CM | POA: Diagnosis present

## 2021-09-16 DIAGNOSIS — E785 Hyperlipidemia, unspecified: Secondary | ICD-10-CM | POA: Insufficient documentation

## 2021-09-16 DIAGNOSIS — Z87891 Personal history of nicotine dependence: Secondary | ICD-10-CM | POA: Diagnosis not present

## 2021-09-16 DIAGNOSIS — C787 Secondary malignant neoplasm of liver and intrahepatic bile duct: Secondary | ICD-10-CM

## 2021-09-16 LAB — CBC WITH DIFFERENTIAL (CANCER CENTER ONLY)
Abs Immature Granulocytes: 0.04 10*3/uL (ref 0.00–0.07)
Basophils Absolute: 0 10*3/uL (ref 0.0–0.1)
Basophils Relative: 0 %
Eosinophils Absolute: 0.2 10*3/uL (ref 0.0–0.5)
Eosinophils Relative: 3 %
HCT: 31.3 % — ABNORMAL LOW (ref 36.0–46.0)
Hemoglobin: 9.4 g/dL — ABNORMAL LOW (ref 12.0–15.0)
Immature Granulocytes: 1 %
Lymphocytes Relative: 16 %
Lymphs Abs: 1.2 10*3/uL (ref 0.7–4.0)
MCH: 24.5 pg — ABNORMAL LOW (ref 26.0–34.0)
MCHC: 30 g/dL (ref 30.0–36.0)
MCV: 81.7 fL (ref 80.0–100.0)
Monocytes Absolute: 0.5 10*3/uL (ref 0.1–1.0)
Monocytes Relative: 7 %
Neutro Abs: 5.6 10*3/uL (ref 1.7–7.7)
Neutrophils Relative %: 73 %
Platelet Count: 306 10*3/uL (ref 150–400)
RBC: 3.83 MIL/uL — ABNORMAL LOW (ref 3.87–5.11)
RDW: 14.1 % (ref 11.5–15.5)
WBC Count: 7.7 10*3/uL (ref 4.0–10.5)
nRBC: 0 % (ref 0.0–0.2)

## 2021-09-16 LAB — CMP (CANCER CENTER ONLY)
ALT: 19 U/L (ref 0–44)
AST: 24 U/L (ref 15–41)
Albumin: 3.4 g/dL — ABNORMAL LOW (ref 3.5–5.0)
Alkaline Phosphatase: 216 U/L — ABNORMAL HIGH (ref 38–126)
Anion gap: 4 — ABNORMAL LOW (ref 5–15)
BUN: 14 mg/dL (ref 8–23)
CO2: 33 mmol/L — ABNORMAL HIGH (ref 22–32)
Calcium: 11.5 mg/dL — ABNORMAL HIGH (ref 8.9–10.3)
Chloride: 102 mmol/L (ref 98–111)
Creatinine: 0.73 mg/dL (ref 0.44–1.00)
GFR, Estimated: >60 mL/min (ref >60)
Glucose, Bld: 128 mg/dL — ABNORMAL HIGH (ref 70–99)
Potassium: 3.6 mmol/L (ref 3.5–5.1)
Sodium: 139 mmol/L (ref 135–145)
Total Bilirubin: 0.3 mg/dL (ref 0.3–1.2)
Total Protein: 6.5 g/dL (ref 6.5–8.1)

## 2021-09-16 LAB — IRON AND IRON BINDING CAPACITY (CC-WL,HP ONLY)
Iron: 24 ug/dL — ABNORMAL LOW (ref 28–170)
Saturation Ratios: 10 % — ABNORMAL LOW (ref 10.4–31.8)
TIBC: 253 ug/dL (ref 250–450)
UIBC: 229 ug/dL (ref 148–442)

## 2021-09-16 LAB — SAMPLE TO BLOOD BANK

## 2021-09-16 LAB — PREALBUMIN: Prealbumin: 13.2 mg/dL — ABNORMAL LOW (ref 18–38)

## 2021-09-16 LAB — FERRITIN: Ferritin: 120 ng/mL (ref 11–307)

## 2021-09-16 LAB — LACTATE DEHYDROGENASE: LDH: 284 U/L — ABNORMAL HIGH (ref 98–192)

## 2021-09-16 MED ORDER — HYDROCODONE-ACETAMINOPHEN 5-325 MG PO TABS: 1.0000 | ORAL_TABLET | ORAL | 0 refills | Status: DC | PRN

## 2021-09-16 NOTE — Progress Notes (Signed)
Referral MD  Reason for Referral: LEFT renal mass -- liver/bone mets  Chief Complaint  Patient presents with   New Patient (Initial Visit)  : I was having a lot of back pain.    HPI: Jane Kennedy is a very nice 76 year old white female.  She is originally from [place].  She she was born [place] and has been back-and-forth to [place] many times.  She now has been down in [place] for many years.  She comes in with her daughter.  Her daughter actually works at [employer] in the department of neurology.  The incredibly horrible news is that her husband tied this morning.  I am incredibly impressed by the fact that she still wanted to make it to her appointment today.  This death was unexpected.  I certainly will pray for her.  Jane Kennedy has had chronic back issues.  However, for the past several weeks, the pain has gotten a lot worse.  This is in the lower back.  There is been no trauma.  She has had no fall.  There is no pain in her legs.  There is no change in bowel or bladder habits.  She has had no fever.  She has lost about 10 pounds.  She has had no cough or shortness of breath.  She used to smoke but stopped 5 years ago.  She went to the emergency room.  She subsequently had a CAT scan done.  This was done on 09/10/2021.  Surprisingly, this showed that she had a 5.8 cm left upper pole renal mass.  She had adjacent soft tissue nodules.  She had tumor thrombus in the left renal vein.  She had diffuse liver metastasis.  She had lytic bone metastasis involving L2.  She had lab work done which showed a sodium 135.  Potassium 3.7.  BUN 18 creatinine 0.8.  Calcium was 11.6 with an albumin of 3.3.  Her white cell count was 10.9.  Hemoglobin 10.8.  Platelet count 400,000.  She was given some hydrocodone.  This is helped a little bit.  Has caused quite a bit of constipation.  She does take some Tylenol and some Celebrex and Aleve to try to help with the pain.  She was currently referred to  the [facility] for an evaluation.  She has had no headache.  There is no dysphagia or odynophagia.  She has had no leg pain or weakness.  She has had no hematuria.  She has had no swollen lymph nodes.  She seems to be up-to-date with her mammograms.  There is no history of significant alcohol use.  She may have an adult beverage every now and then.  There is a history of cancer in the family.  Currently, I would say performance status is probably ECOG 1.      Past Medical History:  Diagnosis Date   Hyperlipidemia    Vertigo   :  No past surgical history on file.:   Current Outpatient Medications:    Biotin 10 MG CAPS, Take 1 tablet by mouth daily., Disp: , Rfl:    fluticasone (FLONASE) 50 MCG/ACT nasal spray, Use 1 spray(s) in each nostril once daily, Disp: , Rfl:    HYDROcodone-acetaminophen (NORCO/VICODIN) 5-325 MG tablet, Take 1-2 tablets by mouth every 6 (six) hours as needed for moderate pain., Disp: 18 tablet, Rfl: 0   loratadine (CLARITIN) 10 MG tablet, Take 1 tablet by mouth daily., Disp: , Rfl:    omeprazole (PRILOSEC)  20 MG capsule, Take by mouth., Disp: , Rfl:    ondansetron (ZOFRAN-ODT) 4 MG disintegrating tablet, Take 1 tablet (4 mg total) by mouth every 8 (eight) hours as needed., Disp: 14 tablet, Rfl: 1:  :   Allergies  Allergen Reactions   Codeine Nausea And Vomiting   Nickel Rash    Patient states that nickel causes her skin to turn green and breakout.  :  No family history on file.:   Social History   Socioeconomic History   Marital status: Married    Spouse name: Not on file   Number of children: Not on file   Years of education: Not on file   Highest education level: Not on file  Occupational History   Not on file  Tobacco Use   Smoking status: Former    Packs/day: 1.00    Types: Cigarettes    Quit date: 2018    Years since quitting: 5.3   Smokeless tobacco: Never  Vaping Use   Vaping Use: Never used  Substance  and Sexual Activity   Alcohol use: Never   Drug use: Never   Sexual activity: Not on file  Other Topics Concern   Not on file  Social History Narrative   Not on file   Social Determinants of Health   Financial Resource Strain: Not on file  Food Insecurity: Not on file  Transportation Needs: Not on file  Physical Activity: Not on file  Stress: Not on file  Social Connections: Not on file  Intimate Partner Violence: Not on file  :  Review of Systems  Constitutional:  Positive for weight loss.  HENT: Negative.    Eyes: Negative.   Respiratory: Negative.    Cardiovascular: Negative.   Gastrointestinal: Negative.   Genitourinary: Negative.   Musculoskeletal:  Positive for back pain.  Skin: Negative.   Neurological: Negative.   Endo/Heme/Allergies: Negative.   Psychiatric/Behavioral: Negative.      Exam: '@IPVITALS'$ @ Physical Exam Vitals reviewed.  HENT:     Head: Normocephalic and atraumatic.  Eyes:     Pupils: Pupils are equal, round, and reactive to light.  Cardiovascular:     Rate and Rhythm: Normal rate and regular rhythm.     Heart sounds: Normal heart sounds.  Pulmonary:     Effort: Pulmonary effort is normal.     Breath sounds: Normal breath sounds.  Abdominal:     General: Bowel sounds are normal.     Palpations: Abdomen is soft.  Musculoskeletal:        General: No tenderness or deformity. Normal range of motion.     Cervical back: Normal range of motion.  Lymphadenopathy:     Cervical: No cervical adenopathy.  Skin:    General: Skin is warm and dry.     Findings: No erythema or rash.  Neurological:     Mental Status: She is alert and oriented to person, place, and time.  Psychiatric:        Behavior: Behavior normal.        Thought Content: Thought content normal.        Judgment: Judgment normal.      Recent Labs    09/16/21 1129  WBC 7.7  HGB 9.4*  HCT 31.3*  PLT 306    Recent Labs    09/16/21 1129  NA 139  K 3.6  CL 102  CO2  33*  GLUCOSE 128*  BUN 14  CREATININE 0.73  CALCIUM 11.5*    Blood smear  review: None  Pathology: Pending    Assessment and Plan: Jane Kennedy is a very charming 76 year old white female.  External looks like this could be metastatic kidney cancer.  She does have an elevated LDH.  Her calcium is also elevated.  This might put her at higher risk category.  Of course, we absolutely have no biopsy results.  We need to set her up with a biopsy.  I would think that a liver biopsy probably would be easiest.  She has numerous liver lesions.  I think this would be the easiest for [place] to go after.  We will definitely need to get enough tissue for molecular studies.  She will definitely need to have an MRI of the spine.  She will need to have MRI of the brain.  She will need to have a CT of the chest.  I would think that she would be a good candidate for combined immunotherapy and targeted therapy.  I realize that there are different options that we have.  I think we will have to see what the biopsy shows.  Of course, we had to make sure that she is able to take care of her husband's funeral plans.  Again this is incredibly sad that she sees [place] today and get her husband has passed on.  I am incredibly impressed with her.  She shows that she has a very strong constitution.  I think that we can be aggressive with her..  She will need to have Xgeva.  The MRI may tell us if we need to have any radiation therapy to the back.  I will know if she may need to have vertebroplasty.  I will refill the hydrocodone for her.  We will try to get as much testing done next week.  Again, a biopsy is going to be critical.  Once we get all the results back from her studies, then we will get her back in and figure out how we can try to address her cancer and how we can treat this aggressively.

## 2021-09-16 NOTE — Progress Notes (Signed)
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 Hanley Seamen patient "Your Patient Navigator" handout which explains my role, areas in which I am able to help, and all the contact information for myself and the office. Also gave patient MD and Navigator business card. Reviewed with patient the general overview of expected course after initial diagnosis and time frame for all steps to be completed.  New patient packet given to patient which includes: orientation to office and staff; campus directory; education on My Chart and Advance Directives; and patient centered education on kidney cancer.   Patient comes in with her daughter Jane Kennedy. Unexpectedly her husband died this morning. She states he was sick, not not expected to die. She asks that any work up/staging be done later next week to allow her to plan/grieve. I told her we absolutely could give her the time she needed.   She states she has a supportive daughter and family. Encouraged her to reach out if she had any questions or concerns, or if there was anything I could help her with.   Patient completed visit with Dr. Marin Olp  Will follow up with patient tomorrow once visit note is dictated and orders placed. Will get plan scheduled keeping patient needs in mind.   Patient understands all follow up procedures and expectations. They have my number to reach out for any further clarification or additional needs.   Oncology Nurse Navigator Documentation     09/16/2021   11:00 AM  Oncology Nurse Navigator Flowsheets  Navigator Follow Up Date: 09/17/2021  Navigator Follow Up Reason: Appointment Review  Navigator Location [facility]  Navigator Encounter Type Initial MedOnc  Patient Visit Type MedOnc  Treatment Phase Abnormal Scans  Barriers/Navigation Needs Cultural Needs;Education  Education Newly Diagnosed Cancer  Education  Interventions Education;Psycho-Social Support  Acuity Level 2-Minimal Needs (1-2 Barriers Identified)  Education Method Verbal;Written  Support Groups/Services Friends and Family  Time Spent with Patient 30

## 2021-09-17 ENCOUNTER — Encounter: Payer: Self-pay | Admitting: *Deleted

## 2021-09-17 ENCOUNTER — Other Ambulatory Visit: Payer: Self-pay

## 2021-09-17 MED ORDER — HYDROCODONE-ACETAMINOPHEN 5-325 MG PO TABS: 1.0000 | ORAL_TABLET | ORAL | 0 refills | Status: DC | PRN

## 2021-09-17 NOTE — Progress Notes (Signed)
To complete diagnosis and staging patient will need:  MRI Spine and Brain CT of Chest Liver Biopsy  Scans scheduled for 5/26 and 5/28. Biopsy is still pending review and scheduling. Will follow up with patient later with week.   Oncology Nurse Navigator Documentation     09/17/2021    1:30 PM  Oncology Nurse Navigator Flowsheets  Navigator Follow Up Date: 09/20/2021  Navigator Follow Up Reason: Scan Review;Patient Call  Navigator Location [facility]  Navigator Encounter Type Appt/Treatment Plan Review  Patient Visit Type MedOnc  Treatment Phase Abnormal Scans  Barriers/Navigation Needs Coordination of Care;Education;Family Concerns  Interventions None Required  Acuity Level 2-Minimal Needs (1-2 Barriers Identified)  Support Groups/Services Friends and Family  Time Spent with Patient 15

## 2021-09-19 ENCOUNTER — Encounter: Payer: Self-pay | Admitting: *Deleted

## 2021-09-20 ENCOUNTER — Ambulatory Visit (HOSPITAL_COMMUNITY)
Admission: RE | Admit: 2021-09-20 | Discharge: 2021-09-20 | Disposition: A | Discharge status: Home / Self Care | Payer: Medicare HMO | Source: Ambulatory Visit | Attending: Hematology & Oncology | Admitting: Hematology & Oncology

## 2021-09-20 ENCOUNTER — Encounter: Payer: Self-pay | Admitting: *Deleted

## 2021-09-20 ENCOUNTER — Other Ambulatory Visit: Payer: Self-pay | Admitting: Hematology & Oncology

## 2021-09-20 ENCOUNTER — Other Ambulatory Visit: Payer: Self-pay | Admitting: *Deleted

## 2021-09-20 DIAGNOSIS — C787 Secondary malignant neoplasm of liver and intrahepatic bile duct: Secondary | ICD-10-CM | POA: Insufficient documentation

## 2021-09-20 DIAGNOSIS — E041 Nontoxic single thyroid nodule: Secondary | ICD-10-CM | POA: Insufficient documentation

## 2021-09-20 DIAGNOSIS — N2889 Other specified disorders of kidney and ureter: Secondary | ICD-10-CM | POA: Insufficient documentation

## 2021-09-20 DIAGNOSIS — C642 Malignant neoplasm of left kidney, except renal pelvis: Secondary | ICD-10-CM | POA: Insufficient documentation

## 2021-09-20 DIAGNOSIS — R160 Hepatomegaly, not elsewhere classified: Secondary | ICD-10-CM

## 2021-09-20 IMAGING — CT CT CHEST W/ CM
2 of 4 series · 15 of 36 positions shown, 18 images · IV contrast (agent unspecified)
Comparison: Abdominopelvic CT [DATE]

CLINICAL DATA: Recent diagnosis of renal cell carcinoma. Evaluate
for thoracic metastatic disease. * Tracking Code: BO *

EXAM:
CT CHEST WITH CONTRAST
TECHNIQUE: Multidetector CT imaging of the chest was performed during
intravenous contrast administration.

[Series 2: axial st · axial · 0.76mm/px · z∈[+1308,+1588]mm · 12 of 166 slices shown, 15 images]
[im 13/166  mediastinal]
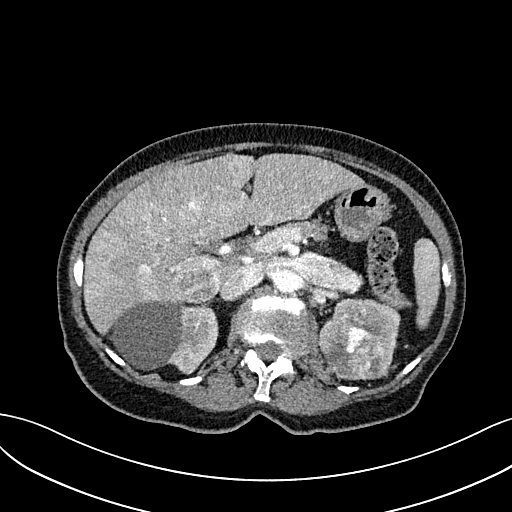
[im 13/166  lung]
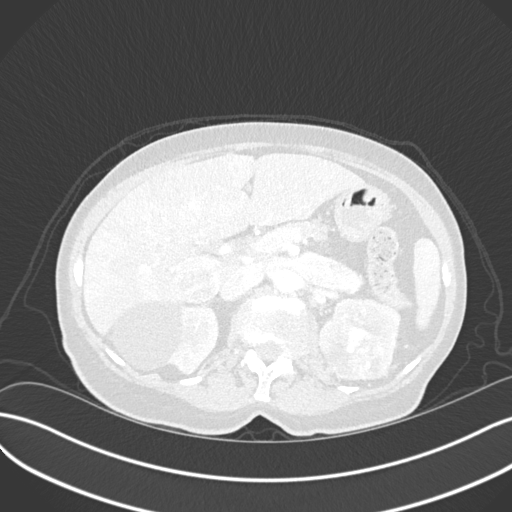
[im 26/166  lung]
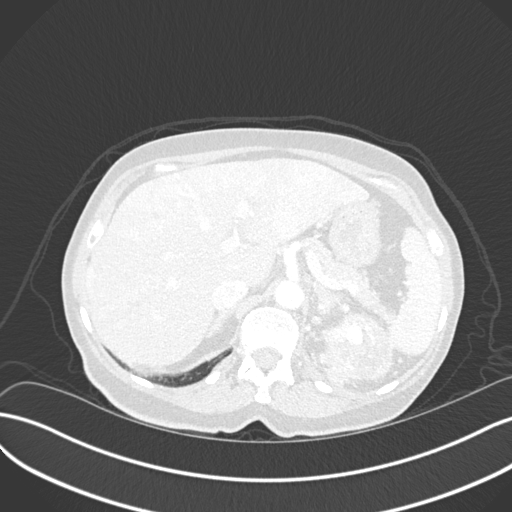
[im 39/166  lung]
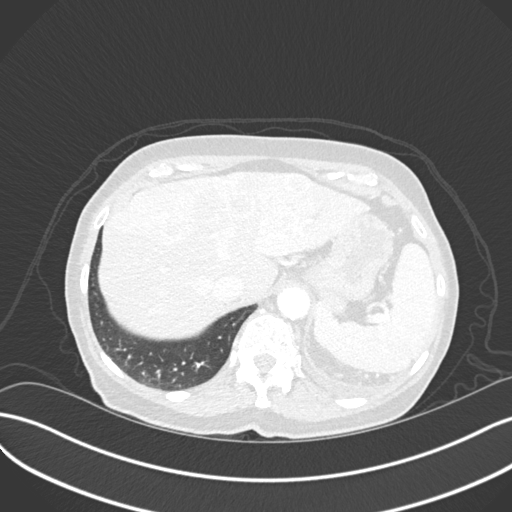
[im 51/166  lung]
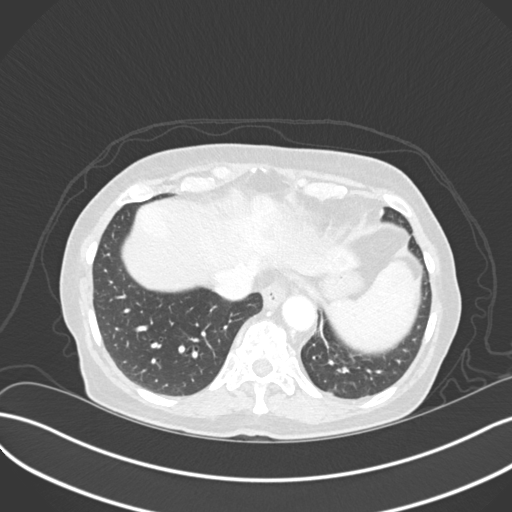
[im 64/166  mediastinal]
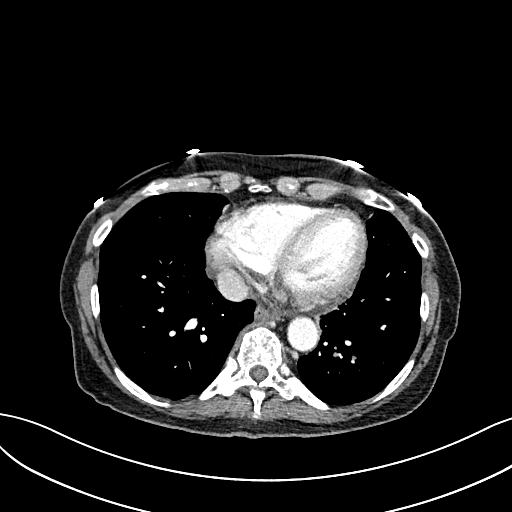
[im 64/166  lung]
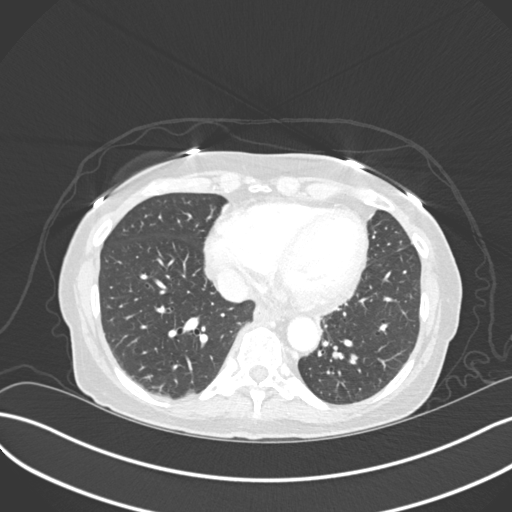
[im 77/166  lung]
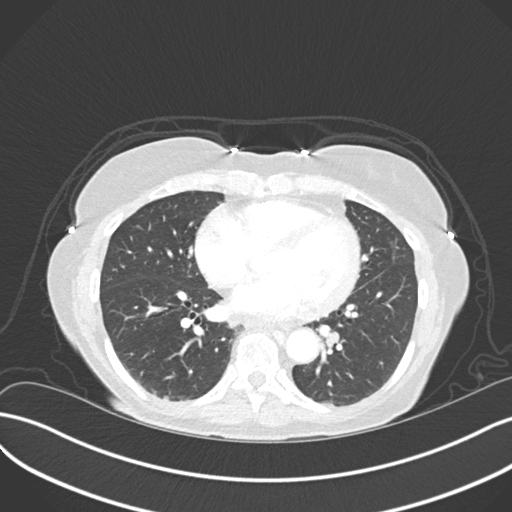
[im 89/166  lung]
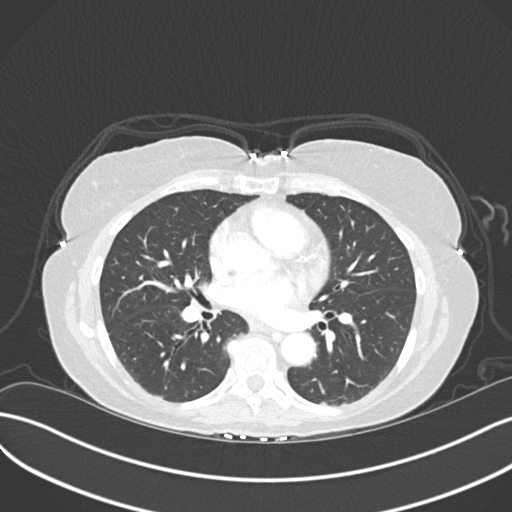
[im 102/166  lung]
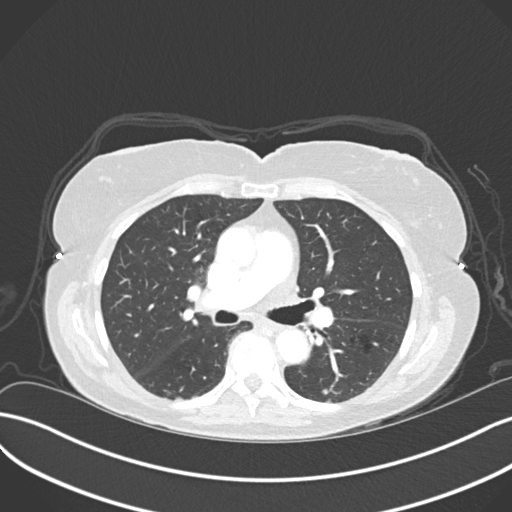
[im 115/166  mediastinal]
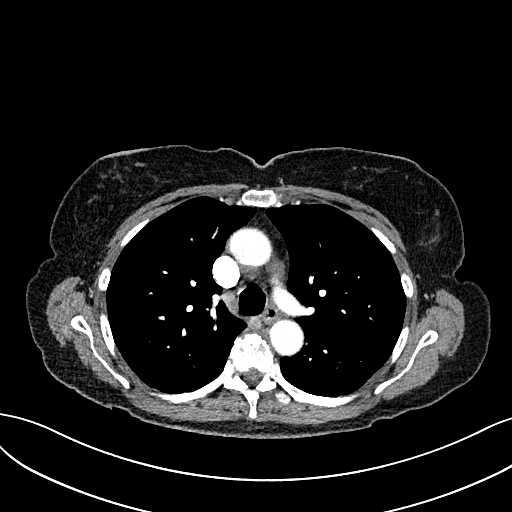
[im 115/166  lung]
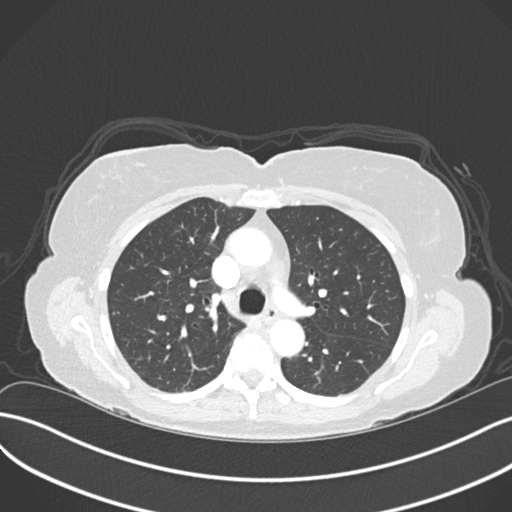
[im 127/166  lung]
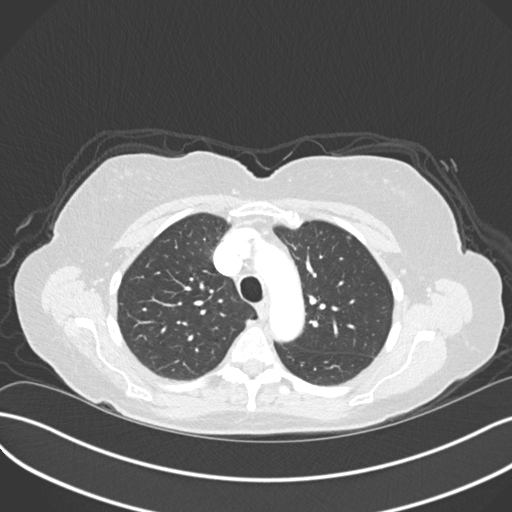
[im 140/166  lung]
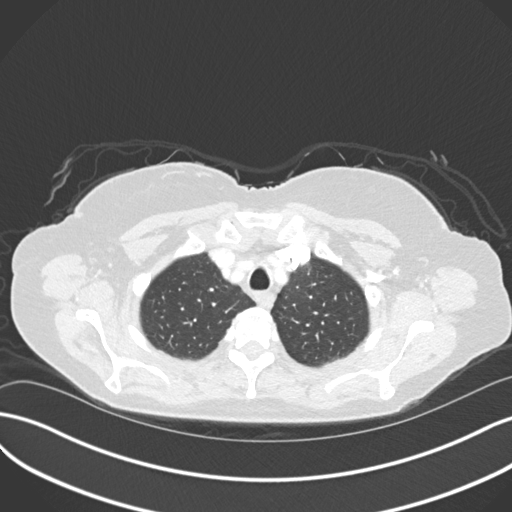
[im 153/166  lung]
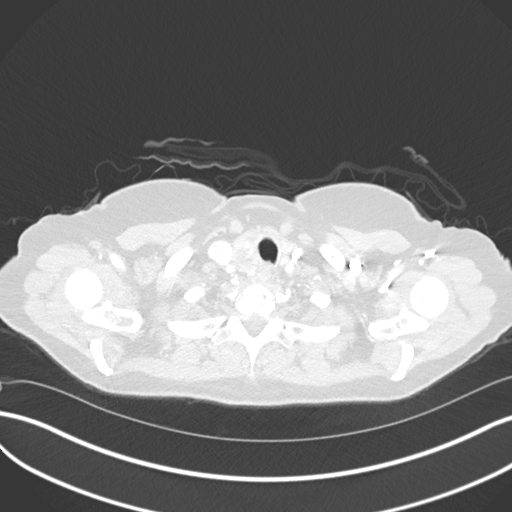

[Series 6: coronal · coronal · 0.68mm/px · 3 of 135 slices shown]
[im 27/135  lung]
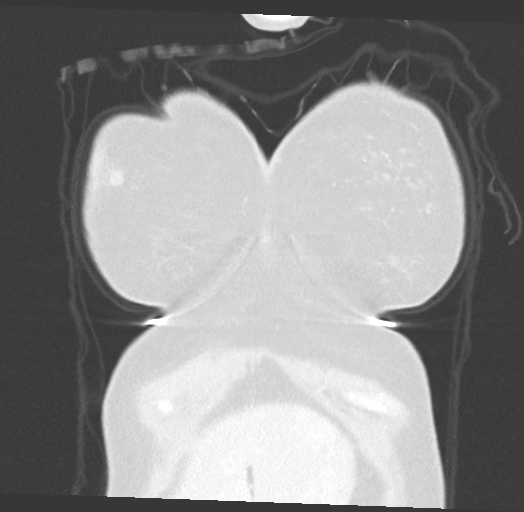
[im 54/135  lung]
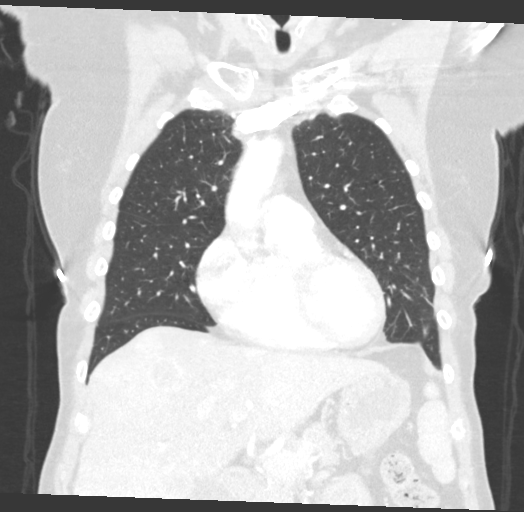
[im 81/135  lung]
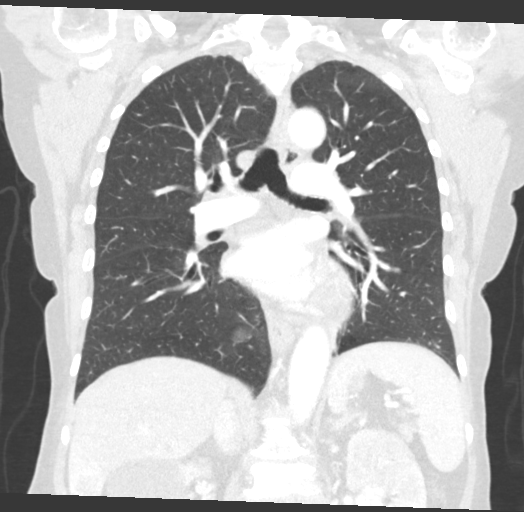

[15 of 36 positions shown; findings below may reference images not displayed]

RADIATION DOSE REDUCTION: This exam was performed according to the
departmental dose-optimization program which includes automated
exposure control, adjustment of the mA and/or kV according to
patient size and/or use of iterative reconstruction technique.

CONTRAST:  75mL OMNIPAQUE IOHEXOL 300 MG/ML  SOLN
FINDINGS: Cardiovascular: No acute vascular findings. Atherosclerosis of the
aorta, great vessels and coronary arteries. The heart size is
normal. There is no pericardial effusion.

Mediastinum/Nodes: There are no enlarged mediastinal, hilar or
axillary lymph nodes.Indeterminate low-density right thyroid nodule
measuring 2.4 x 2.1 cm on image [DATE]. The esophagus and trachea
appear unremarkable.

Lungs/Pleura: No pleural effusion or pneumothorax. There is an
endobronchial lesion within the left lower lobe which measures up to
1.4 cm on image 87/5. Upstream from this, the corresponding bronchus
is impacted. There are scattered additional small pulmonary nodules,
largest measuring 6 mm in the left lower lobe on image 64/5. In the
right lower lobe, a nodular density measuring approximately 7 mm on
image 120/5 is intimately associated with an adjacent vessel, and is
probably a small varix.

Upper abdomen: As seen on recent abdominal CT, there is a large
heterogeneous mass involving the upper pole of the left kidney
consistent with renal cell carcinoma. This mass demonstrates local
extension into the retroperitoneal fat, and there is tumor thrombus
in the left renal vein which may extend into the IVC. Extensive
metastatic disease to the liver is again noted.

Musculoskeletal/Chest wall: No definite osseous metastases are
identified within the chest. Lytic lesion within the L2 vertebral
body again noted, consistent with a metastasis.
IMPRESSION: 1. No findings definitive for metastatic disease within the chest.
2. There are small indeterminate pulmonary nodules bilaterally, and
there is impaction of a left lower lobe bronchus by a potential
endobronchial mass. Recommend attention on follow-up.
3. 2.4 cm right thyroid nodule. Recommend thyroid US.(Ref: [HOSPITAL]. [DATE]): 143-50).
4. Again demonstrated findings of locally advanced left renal cell
carcinoma with metastatic disease to the liver and L2 vertebral
body. Suspected extension of tumor thrombus into the IVC.

## 2021-09-20 MED ORDER — IOHEXOL 300 MG/ML  SOLN
75.0000 mL | Freq: Once | INTRAMUSCULAR | Status: AC | PRN
  Administered 2021-09-20: 75 mL via INTRAVENOUS

## 2021-09-20 MED ORDER — SODIUM CHLORIDE (PF) 0.9 % IJ SOLN
INTRAMUSCULAR | Status: AC
  Filled 2021-09-20: qty 50

## 2021-09-20 MED ORDER — ONDANSETRON HCL 8 MG PO TABS: 8.0000 mg | ORAL_TABLET | Freq: Three times a day (TID) | ORAL | 0 refills | Status: DC | PRN

## 2021-09-20 NOTE — Progress Notes (Signed)
Patient is calling with c/o constipation and pain. She is only taking one vicoden tablet twice a day because of the constipation.  Instructed patient to start Miralax BID daily. She can hold for loose or runny stools. Encouraged patient to take her Vicoden more often, within prescription directions. She also requests more nausea medication be sent in. Prescription sent and pharmacy confirmed.  Patient has not yet been scheduled for biopsy. Scheduling to reach out to them this afternoon.   Patient's husband's funeral is next week. I encouraged her to call if she needed anything.   Oncology Nurse Navigator Documentation     09/20/2021    2:30 PM  Oncology Nurse Navigator Flowsheets  Navigator Follow Up Date: 09/24/2021  Navigator Follow Up Reason: Scan Review  Navigator Location [facility]  Navigator Encounter Type Telephone  Telephone Symptom Mgt;Incoming Call  Patient Visit Type MedOnc  Treatment Phase Abnormal Scans  Barriers/Navigation Needs Coordination of Care;Education;Family Concerns  Education Pain/ Symptom Management  Interventions Coordination of Care;Education;Psycho-Social Support  Acuity Level 2-Minimal Needs (1-2 Barriers Identified)  Coordination of Care Radiology  Education Method Verbal;Teach-back  Support Groups/Services Friends and Family  Time Spent with Patient 30

## 2021-09-20 NOTE — Progress Notes (Unsigned)
Mir, Paula Libra, MD  Donita Brooks D Approved for US guided liver lesion biopsy.   Do we need a new order from them specifying that its a liver mass biopsy (not renal mass)?   Mir

## 2021-09-22 ENCOUNTER — Ambulatory Visit (HOSPITAL_COMMUNITY)
Admission: RE | Admit: 2021-09-22 | Discharge: 2021-09-22 | Disposition: A | Discharge status: Home / Self Care | Payer: Medicare HMO | Source: Ambulatory Visit | Attending: Hematology & Oncology | Admitting: Hematology & Oncology

## 2021-09-22 DIAGNOSIS — N2889 Other specified disorders of kidney and ureter: Secondary | ICD-10-CM

## 2021-09-22 IMAGING — MR MR HEAD WO/W CM
13 series · 48 of 48 positions shown · IV contrast (7ml GADAVIST)
Comparison: None Available.

CLINICAL DATA: Renal mass, right [GL] ([GL]-CM). Staging for
metastatic kidney cancer.

EXAM:
MRI HEAD WITHOUT AND WITH CONTRAST
TECHNIQUE: Multiplanar, multiecho pulse sequences of the brain and surrounding
structures were obtained without and with intravenous contrast.
CONTRAST:  6mL GADAVIST GADOBUTROL 1 MMOL/ML IV SOLN

[Series 5: DWI · axial · 3.0mm · 1.36mm/px · z∈[-45,+95]mm · 7 of 96 slices shown (1 of 2)]
[im 1/96]
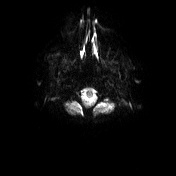
[im 16/96]
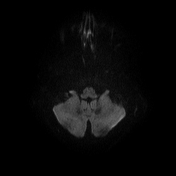
[im 32/96]
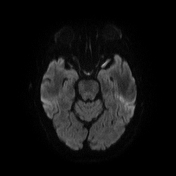
[im 48/96]
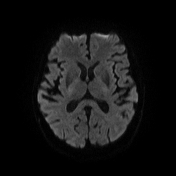
[im 64/96]
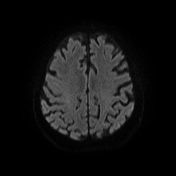
[im 80/96]
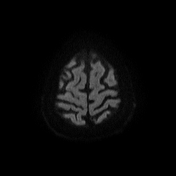
[im 96/96]
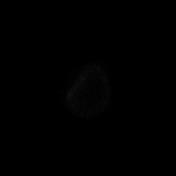

[Series 6: DWI · axial · 3.0mm · 1.36mm/px · z∈[-45,+95]mm · 3 of 48 slices shown (2 of 2)]
[im 1/48]
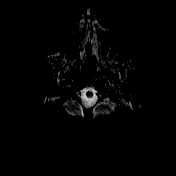
[im 24/48]
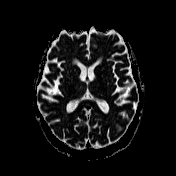
[im 48/48]
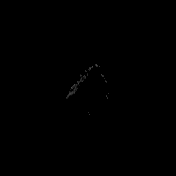

[Series 7: T1 · sagittal · 5.0mm · 0.75mm/px · 1 of 24 slices shown (1 of 2)]
[im 1/24]
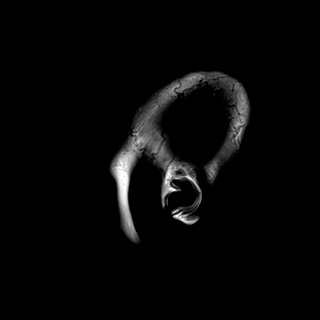

[Series 8: T2 · axial · 5.0mm · 0.62mm/px · z∈[-55,+106]mm · 2 of 26 slices shown]
[im 1/26]
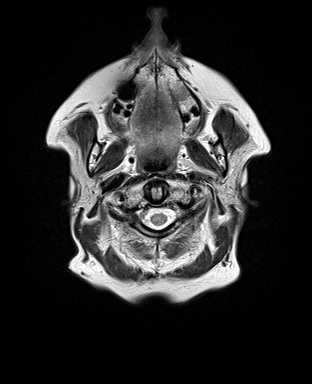
[im 26/26]
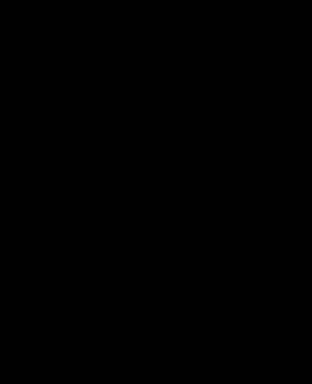

[Series 9: swi_images · axial · 3.0mm · 0.75mm/px · z∈[-80,+131]mm · 4 of 72 slices shown]
[im 1/72]
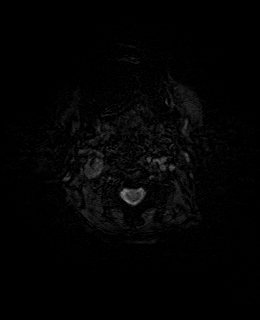
[im 24/72]
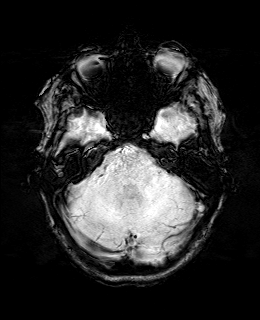
[im 48/72]
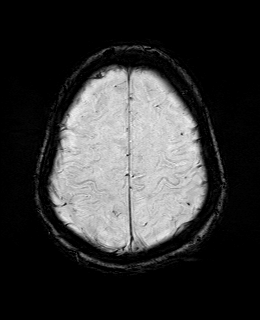
[im 72/72]
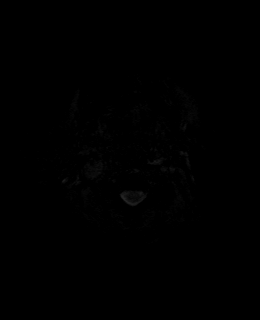

[Series 11: FLAIR · axial · 3.0mm · 0.75mm/px · z∈[-51,+101]mm · 3 of 52 slices shown]
[im 1/52]
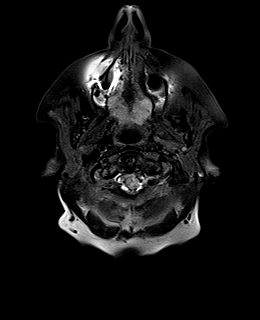
[im 26/52]
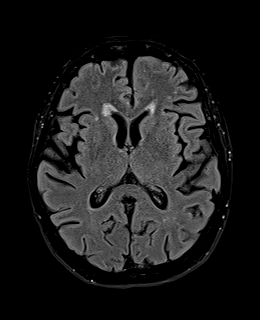
[im 52/52]
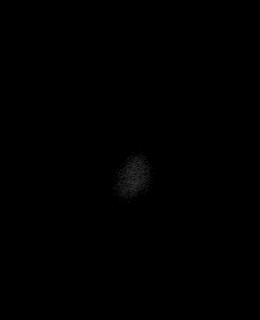

[Series 12: T1 · axial · 1.0mm · 0.94mm/px · z∈[-46,+96]mm · 9 of 144 slices shown (2 of 2)]
[im 1/144]
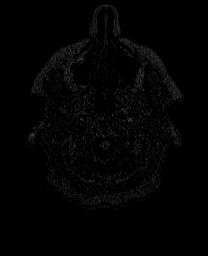
[im 18/144]
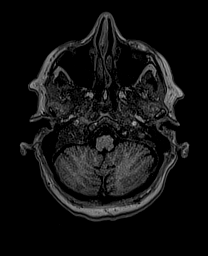
[im 36/144]
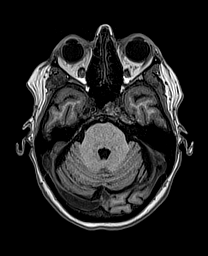
[im 54/144]
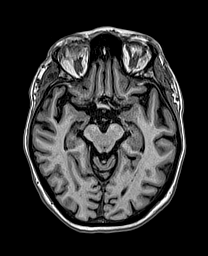
[im 72/144]
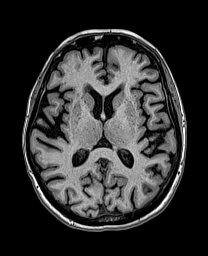
[im 90/144]
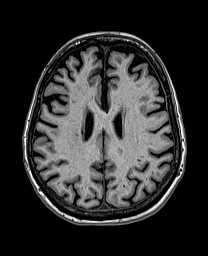
[im 108/144]
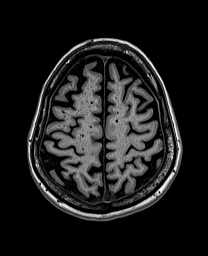
[im 126/144]
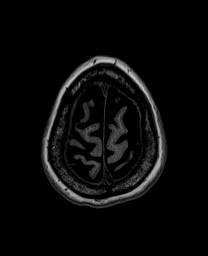
[im 144/144]
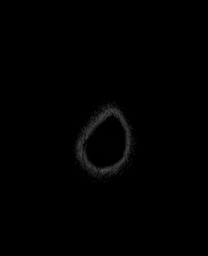

[Series 13: cor dwi_tracew · coronal · 5.0mm · 1.53mm/px · 3 of 56 slices shown]
[im 1/56]
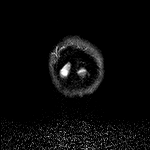
[im 28/56]
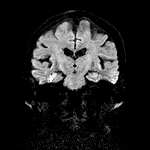
[im 56/56]
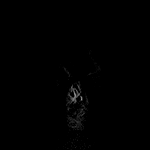

[Series 14: cor dwi_adc · coronal · 5.0mm · 1.53mm/px · 2 of 28 slices shown]
[im 1/28]
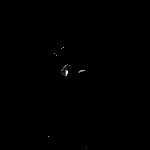
[im 28/28]
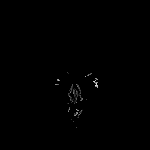

[Series 15: T1 post-contrast · axial · 1.0mm · 0.94mm/px · z∈[-46,+96]mm · 9 of 144 slices shown (1 of 3)]
[im 1/144]
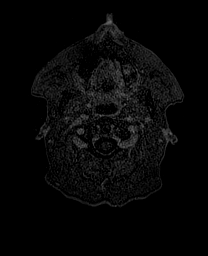
[im 18/144]
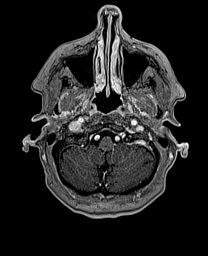
[im 36/144]
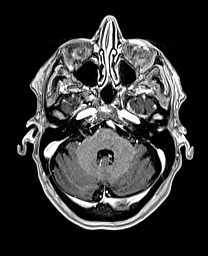
[im 54/144]
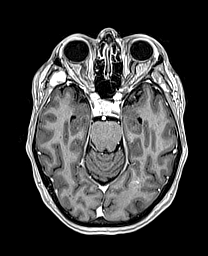
[im 72/144]
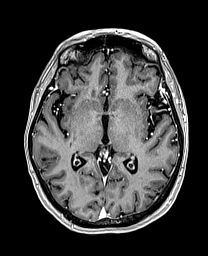
[im 90/144]
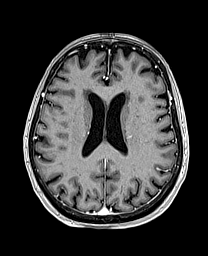
[im 108/144]
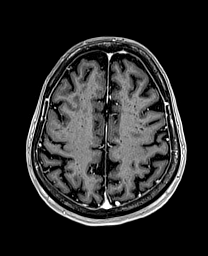
[im 126/144]
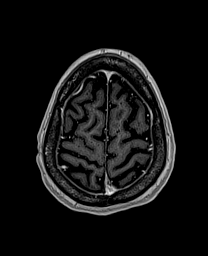
[im 144/144]
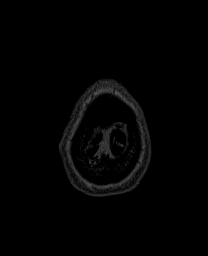

[Series 16: T1 post-contrast · coronal · 5.0mm · 0.43mm/px · 2 of 29 slices shown (2 of 3)]
[im 1/29]
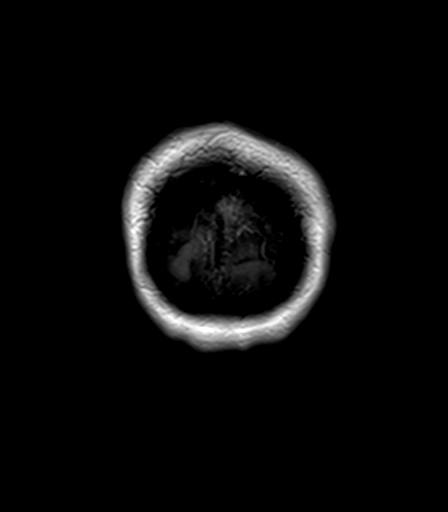
[im 29/29]
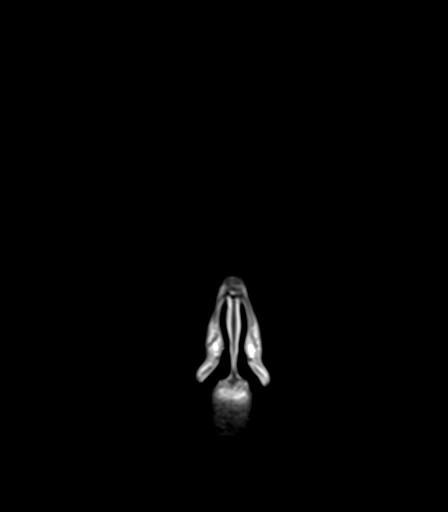

[Series 17: T1 post-contrast · sagittal · 5.0mm · 0.75mm/px · 1 of 24 slices shown (3 of 3)]
[im 1/24]
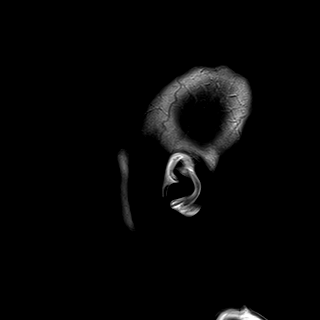

[Series 18: T2 post-contrast · coronal · 5.0mm · 0.57mm/px · 2 of 28 slices shown]
[im 1/28]
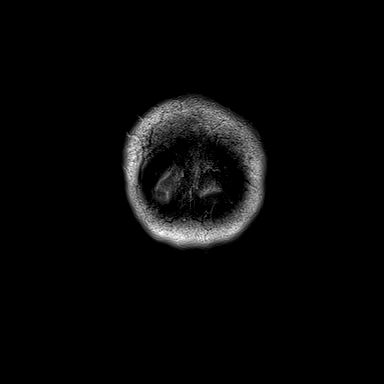
[im 28/28]
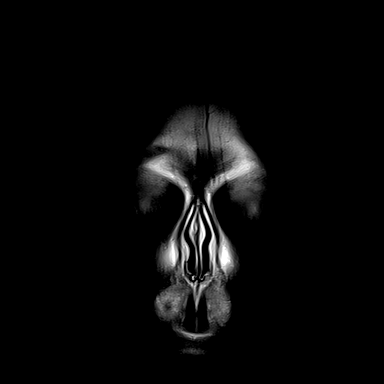

[48 of 48 positions shown; findings below may reference images not displayed]

FINDINGS: Brain: No acute infarction, hemorrhage, hydrocephalus, extra-axial
collection or intracranial mass lesion. Susceptibility artifact in
the right precentral sulcus suggesting hemosiderin deposit.
Scattered foci of T2 hyperintensity are seen within the white matter
of the cerebral hemispheres, nonspecific, most likely related to
chronic small vessel ischemia. Mild parenchymal volume loss.

Vascular: Normal flow voids.

Skull and upper cervical spine: Normal marrow signal.

Sinuses/Orbits: Negative.

Other: Lobulated enhancing mass lesion within the right temporalis
muscle measuring approximately 2.7 x 1.2 x 1.2 cm. On the
susceptibility weighted images, 2 foci of susceptibility artifact
are seen within this lesion, suggesting phleboliths. A vascular
channel appear to connect the lesion through the greater wing of the
right sphenoid two diploic veins. The lesion has facilitated
diffusion.
IMPRESSION: 1. No evidence of intracranial metastatic disease.
2. Mild chronic microvascular ischemic changes of the white matter.
3. Lobulated enhancing lesion within the right temporalis muscle is
suggestive of a venous malformation. A head CT could confirm the
presence of phleboliths.

## 2021-09-22 IMAGING — MR MR LUMBAR SPINE WO/W CM
4 of 7 series · 27 of 48 positions shown · IV contrast (7ml GADAVIST)
Comparison: CT of the abdomen [DATE]; CT of the chest [DATE].

CLINICAL DATA: Renal mass, right [ZN] ([ZN]-CM). Hematologic
malignancy, staging; She has L2 disease on CT scan. History of
likely kidney cancer

EXAM:
MRI LUMBAR SPINE WITHOUT AND WITH CONTRAST
TECHNIQUE: Multiplanar and multiecho pulse sequences of the lumbar spine were
obtained without and with intravenous contrast.
CONTRAST:  6mL GADAVIST GADOBUTROL 1 MMOL/ML IV SOLN

[Series 9: T1 · sagittal · 4.0mm · 0.81mm/px · 3 of 17 slices shown (1 of 2)]
[im 1/17]
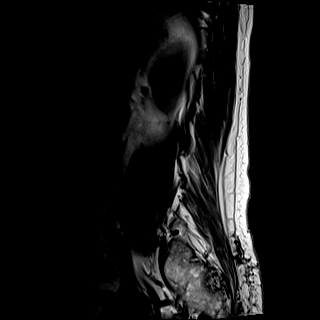
[im 9/17]
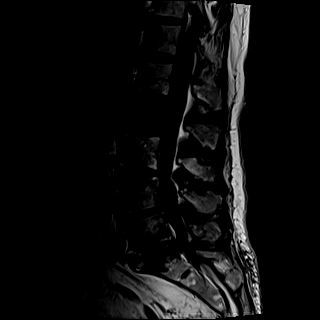
[im 17/17]
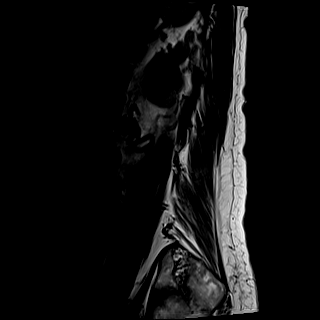

[Series 11: T2 · axial · 4.0mm · 0.62mm/px · z∈[-633,-415]mm · 11 of 45 slices shown]
[im 1/45]
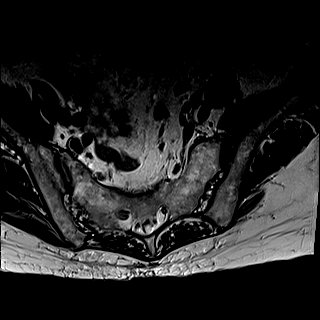
[im 5/45]
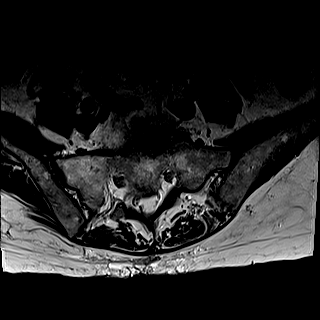
[im 9/45]
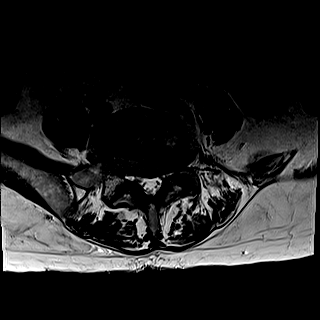
[im 14/45]
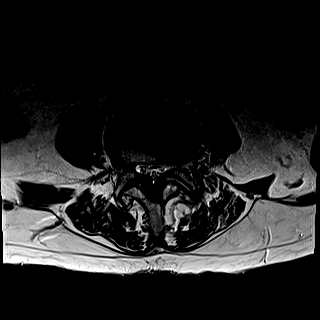
[im 18/45]
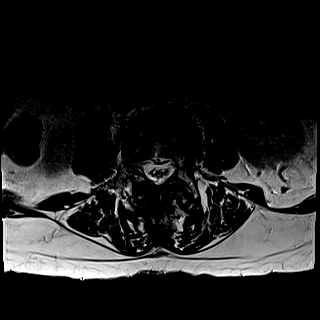
[im 23/45]
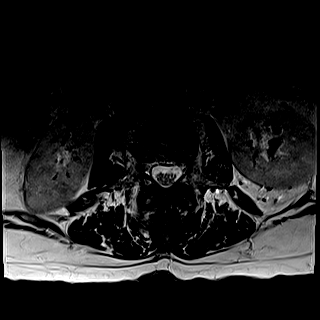
[im 27/45]
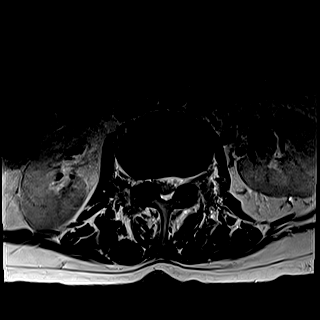
[im 31/45]
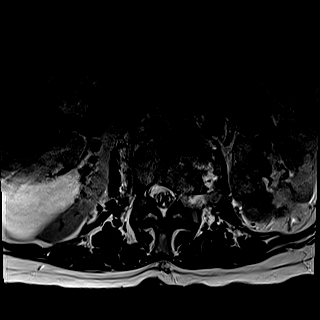
[im 36/45]
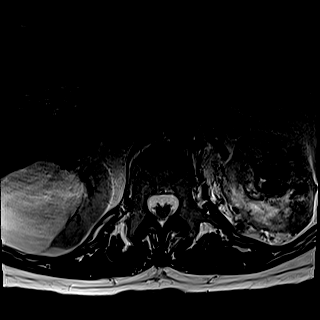
[im 40/45]
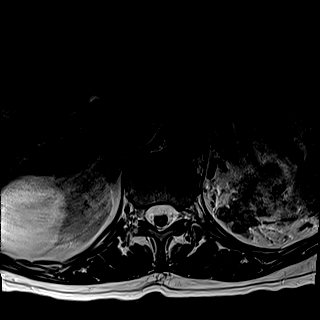
[im 45/45]
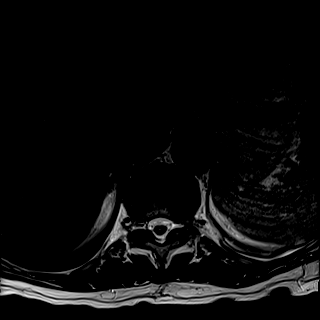

[Series 12: T1 · axial · 4.0mm · 0.39mm/px · z∈[-633,-439]mm · 9 of 45 slices shown (2 of 2)]
[im 1/45]
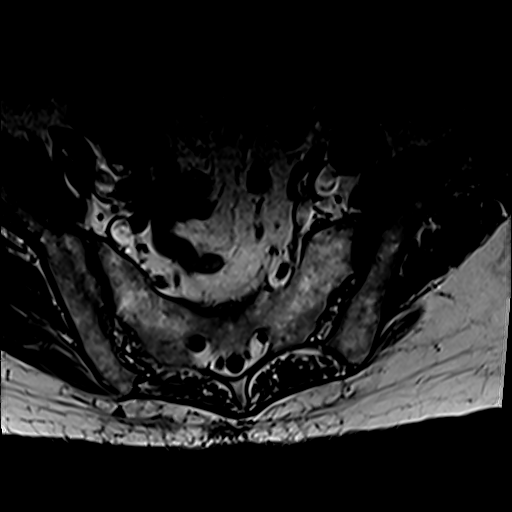
[im 5/45]
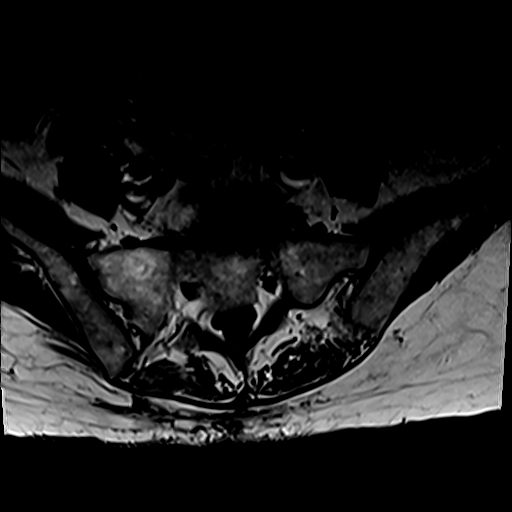
[im 9/45]
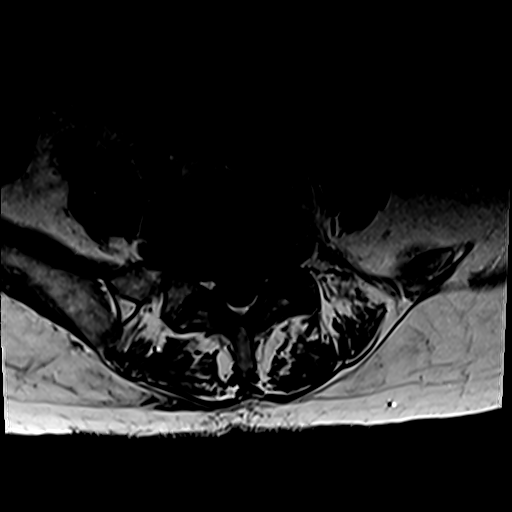
[im 14/45]
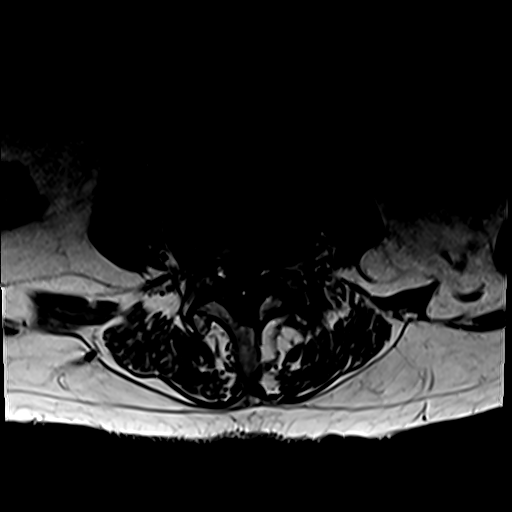
[im 18/45]
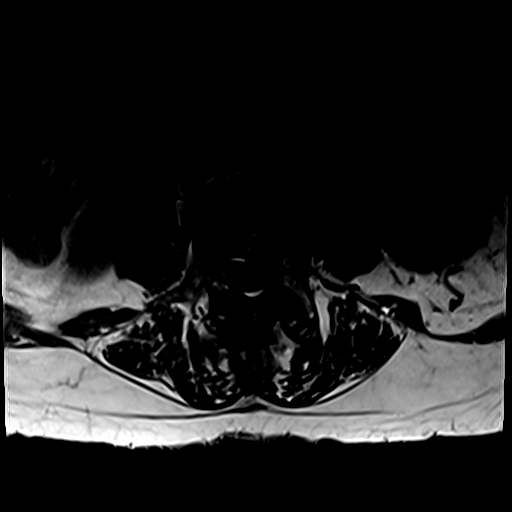
[im 23/45]
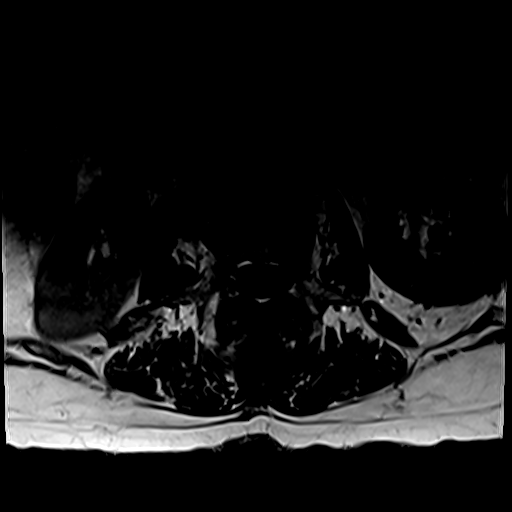
[im 27/45]
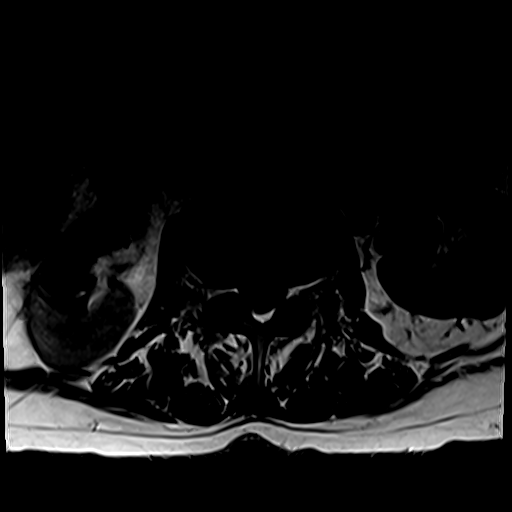
[im 31/45]
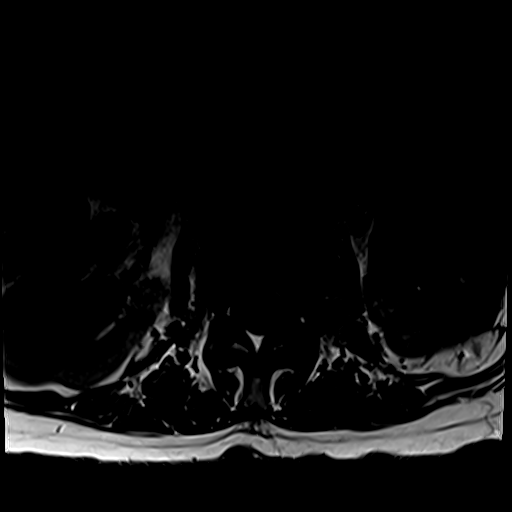
[im 40/45]
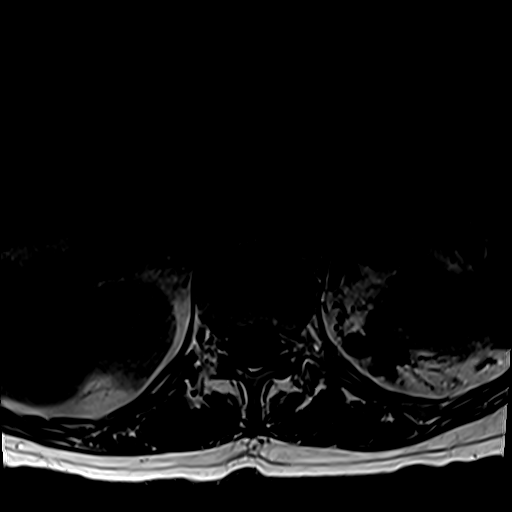

[Series 13: T2 post-contrast · sagittal · 4.0mm · 0.81mm/px · 4 of 17 slices shown]
[im 1/17]
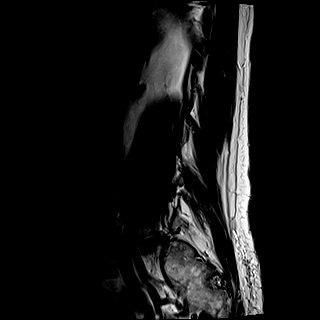
[im 6/17]
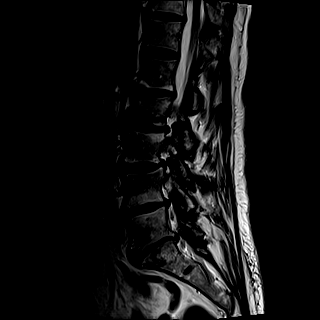
[im 11/17]
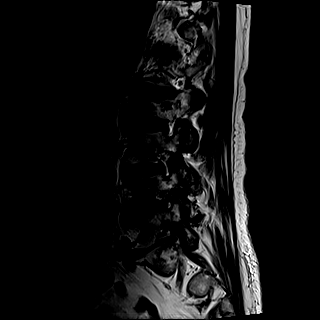
[im 17/17]
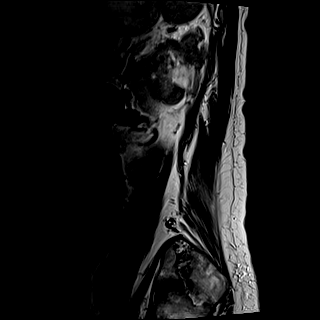

[27 of 48 positions shown; findings below may reference images not displayed]

FINDINGS: Segmentation:  Standard.

Alignment:  Trace anterolisthesis at L3-4.

Vertebrae: Pathologic compression fracture of the L2 vertebral body
resulting in approximately 55% vertebral body height loss which is
most significant on the left side fall. The tumor extends into the
bilateral pedicles with expansion of the left pedicle as well as
retropulsion of the posterior wall into the spinal canal on the left
side with mass effect on the thecal sac without high-grade spinal
canal stenosis. No definitive evidence of other metastatic lesion to
the lumbar spine.

Chronic endplate degenerative changes at L4-5 and L5-S1. No evidence
of discitis.

Conus medullaris and cauda equina: Conus extends to the L2 level.
Conus and cauda equina appear normal.

Paraspinal and other soft tissues: Musculature appear preserved.

Large irregularly enhancing mass lesion in the upper pole of the
left kidney, consistent with known renal cell carcinoma with
multiple small satellite perinephric lesions. Associated thrombus
within the left renal vein extending into the inferior vena cava
left gonadal vein. A a 6.2 cm right renal cyst. Cholelithiasis.
Hepatic heterogeneity related to known metastatic disease.

Disc levels:

T12-L1: No spinal canal or neural foraminal stenosis.

L1-2: Disc bulge and facet degenerative changes as well as cortical
expansion of the left L2 pedicle resulting in mild left neural
foraminal narrowing. No spinal canal stenosis.

L2-3: Retropulsion of the L2 posterior wall on the left and cortical
expansion of the L2 pedicle resulting in overall mild spinal canal
stenosis which is moderate on the left side. Superimposed disc bulge
and facet degenerative changes also contribute for moderate
bilateral neural foraminal narrowing.

L3-4: Disc bulge and hypertrophic facet degenerative changes
ligamentum flavum redundancy resulting in moderate spinal canal
stenosis and moderate bilateral neural foraminal narrowing.

L4-5: Prominent loss of disc height, disc bulge and hypertrophic
facet degenerative changes with ligamentum flavum redundancy
resulting in moderate to severe spinal canal stenosis with severe
narrowing of the bilateral subarticular zones and moderate bilateral
neural foraminal narrowing, left greater than right.

L5-S1: Prominent loss of disc height, disc bulge with associated
osteophytic component and moderate facet hypertrophy with ligamentum
flavum redundancy resulting in mild spinal canal stenosis with
severe right and moderate left subarticular zone stenosis, moderate
to severe bilateral neural foraminal narrowing.
IMPRESSION: 1. Pathologic fracture of the L2 vertebral body with approximately
55% vertebral body height loss. Extension into the pedicles with
retropulsion of the posterior wall and expansion of the left pedicle
causing mass effect on the thecal sac and resulting in overall mild
spinal canal stenosis. The lesion also contribute for moderate left
neural foraminal narrowing at L1-2 and L2-3.
2. Advanced degenerative changes of the lumbar spine with moderate
spinal canal stenosis at L3-4 and moderate to severe spinal canal
stenosis at L4-5.
3. Multilevel high-grade neural foraminal narrowing, as described
above.
4. Findings consistent with left renal cell carcinoma with tumoral
thrombus extending into the inferior vena cava and left gonadal
vein, as seen on prior CT.

## 2021-09-22 MED ORDER — GADOBUTROL 1 MMOL/ML IV SOLN
6.0000 mL | Freq: Once | INTRAVENOUS | Status: AC | PRN
  Administered 2021-09-22: 6 mL via INTRAVENOUS

## 2021-09-24 ENCOUNTER — Encounter: Payer: Self-pay | Admitting: *Deleted

## 2021-09-24 NOTE — Progress Notes (Signed)
Volanda Napoleon, MD  P Onc Nurse Hp Call - no obvious cancer in the lungs.  Laurey Arrow   Results given to patient via [place]  Oncology Nurse Navigator Documentation     09/24/2021    8:30 AM  Oncology Nurse Navigator Flowsheets  Navigator Follow Up Date: 10/01/2021  Navigator Follow Up Reason: Surgery  Navigator Location [facility]  Navigator Encounter Type Scan Review;MyChart  Patient Visit Type MedOnc  Treatment Phase Abnormal Scans  Barriers/Navigation Needs Coordination of Care;Education;Family Concerns  Education Other  Interventions Education  Acuity Level 2-Minimal Needs (1-2 Barriers Identified)  Education Method Written  Support Groups/Services Friends and Family  Time Spent with Patient 15

## 2021-09-26 ENCOUNTER — Other Ambulatory Visit: Payer: Self-pay | Admitting: Hematology & Oncology

## 2021-09-26 ENCOUNTER — Encounter: Payer: Self-pay | Admitting: *Deleted

## 2021-09-26 DIAGNOSIS — N2889 Other specified disorders of kidney and ureter: Secondary | ICD-10-CM

## 2021-09-26 NOTE — Progress Notes (Signed)
Reviewed MRI with Dr Marin Olp. He would like patient considered for a vertebroplasty with IR. Order placed.   Patient's daughter, Sena Slate called with concerns with pain control. She states her mom will only take one vicoden tablet every 6 hours but continues to be in significant pain. Discussed with her the need for increased medication to obtain pain management. Reviewed MRI with her and the plan for possible vertebroplasty. Also discussed radiation down the road, once tissue diagnosis is made.She is aware that IR will be calling to schedule a consult. Sena Slate will talk to her mom about taking her medication more often and that once a tissue diagnosis is made that we can offer more, non-narcotic options for pain management.   Oncology Nurse Navigator Documentation     09/26/2021   11:00 AM  Oncology Nurse Navigator Flowsheets  Navigator Follow Up Date: 10/01/2021  Navigator Follow Up Reason: Surgery  Navigator Location [facility]  Navigator Encounter Type Scan Review;Telephone  Telephone Symptom Mgt;Incoming Call  Patient Visit Type MedOnc  Treatment Phase Abnormal Scans  Barriers/Navigation Needs Coordination of Care;Education;Family Concerns  Education Pain/ Symptom Management  Interventions Education;Psycho-Social Support  Acuity Level 2-Minimal Needs (1-2 Barriers Identified)  Education Method Verbal;Teach-back  Support Groups/Services Friends and Family  Time Spent with Patient 30

## 2021-09-30 ENCOUNTER — Encounter: Payer: Self-pay | Admitting: *Deleted

## 2021-09-30 ENCOUNTER — Other Ambulatory Visit: Payer: Self-pay

## 2021-09-30 ENCOUNTER — Other Ambulatory Visit: Payer: Self-pay | Admitting: Student

## 2021-09-30 ENCOUNTER — Other Ambulatory Visit: Payer: Self-pay | Admitting: Radiology

## 2021-09-30 ENCOUNTER — Inpatient Hospital Stay (HOSPITAL_COMMUNITY)
Admission: EM | Admit: 2021-09-30 | Discharge: 2021-10-21 | DRG: 673 | Disposition: A | Discharge status: Hospice | Payer: Medicare HMO | Attending: Internal Medicine | Admitting: Internal Medicine

## 2021-09-30 ENCOUNTER — Encounter (HOSPITAL_COMMUNITY): Payer: Self-pay

## 2021-09-30 DIAGNOSIS — R410 Disorientation, unspecified: Secondary | ICD-10-CM

## 2021-09-30 DIAGNOSIS — M51379 Other intervertebral disc degeneration, lumbosacral region without mention of lumbar back pain or lower extremity pain: Secondary | ICD-10-CM

## 2021-09-30 DIAGNOSIS — M549 Dorsalgia, unspecified: Secondary | ICD-10-CM

## 2021-09-30 DIAGNOSIS — R1110 Vomiting, unspecified: Secondary | ICD-10-CM

## 2021-09-30 DIAGNOSIS — K2100 Gastro-esophageal reflux disease with esophagitis, without bleeding: Secondary | ICD-10-CM | POA: Diagnosis present

## 2021-09-30 DIAGNOSIS — E876 Hypokalemia: Secondary | ICD-10-CM

## 2021-09-30 DIAGNOSIS — C642 Malignant neoplasm of left kidney, except renal pelvis: Secondary | ICD-10-CM | POA: Diagnosis not present

## 2021-09-30 DIAGNOSIS — M8448XA Pathological fracture, other site, initial encounter for fracture: Secondary | ICD-10-CM

## 2021-09-30 DIAGNOSIS — Z823 Family history of stroke: Secondary | ICD-10-CM

## 2021-09-30 DIAGNOSIS — Z6823 Body mass index (BMI) 23.0-23.9, adult: Secondary | ICD-10-CM

## 2021-09-30 DIAGNOSIS — Z66 Do not resuscitate: Secondary | ICD-10-CM | POA: Diagnosis present

## 2021-09-30 DIAGNOSIS — C7951 Secondary malignant neoplasm of bone: Secondary | ICD-10-CM | POA: Diagnosis not present

## 2021-09-30 DIAGNOSIS — E7220 Disorder of urea cycle metabolism, unspecified: Secondary | ICD-10-CM | POA: Diagnosis present

## 2021-09-30 DIAGNOSIS — R109 Unspecified abdominal pain: Secondary | ICD-10-CM

## 2021-09-30 DIAGNOSIS — Z807 Family history of other malignant neoplasms of lymphoid, hematopoietic and related tissues: Secondary | ICD-10-CM

## 2021-09-30 DIAGNOSIS — K769 Liver disease, unspecified: Secondary | ICD-10-CM

## 2021-09-30 DIAGNOSIS — Z833 Family history of diabetes mellitus: Secondary | ICD-10-CM

## 2021-09-30 DIAGNOSIS — D509 Iron deficiency anemia, unspecified: Secondary | ICD-10-CM

## 2021-09-30 DIAGNOSIS — M5137 Other intervertebral disc degeneration, lumbosacral region: Secondary | ICD-10-CM

## 2021-09-30 DIAGNOSIS — Z87891 Personal history of nicotine dependence: Secondary | ICD-10-CM

## 2021-09-30 DIAGNOSIS — K5900 Constipation, unspecified: Secondary | ICD-10-CM | POA: Diagnosis not present

## 2021-09-30 DIAGNOSIS — D500 Iron deficiency anemia secondary to blood loss (chronic): Secondary | ICD-10-CM

## 2021-09-30 DIAGNOSIS — M5431 Sciatica, right side: Secondary | ICD-10-CM

## 2021-09-30 DIAGNOSIS — G9341 Metabolic encephalopathy: Secondary | ICD-10-CM

## 2021-09-30 DIAGNOSIS — K219 Gastro-esophageal reflux disease without esophagitis: Secondary | ICD-10-CM

## 2021-09-30 DIAGNOSIS — C787 Secondary malignant neoplasm of liver and intrahepatic bile duct: Secondary | ICD-10-CM | POA: Diagnosis not present

## 2021-09-30 DIAGNOSIS — E785 Hyperlipidemia, unspecified: Secondary | ICD-10-CM | POA: Diagnosis present

## 2021-09-30 DIAGNOSIS — M25511 Pain in right shoulder: Secondary | ICD-10-CM

## 2021-09-30 DIAGNOSIS — M8458XA Pathological fracture in neoplastic disease, other specified site, initial encounter for fracture: Secondary | ICD-10-CM | POA: Diagnosis present

## 2021-09-30 DIAGNOSIS — N2889 Other specified disorders of kidney and ureter: Secondary | ICD-10-CM

## 2021-09-30 DIAGNOSIS — E440 Moderate protein-calorie malnutrition: Secondary | ICD-10-CM | POA: Diagnosis present

## 2021-09-30 DIAGNOSIS — Z515 Encounter for palliative care: Secondary | ICD-10-CM

## 2021-09-30 DIAGNOSIS — S32029A Unspecified fracture of second lumbar vertebra, initial encounter for closed fracture: Secondary | ICD-10-CM

## 2021-09-30 DIAGNOSIS — Z79899 Other long term (current) drug therapy: Secondary | ICD-10-CM

## 2021-09-30 DIAGNOSIS — C649 Malignant neoplasm of unspecified kidney, except renal pelvis: Secondary | ICD-10-CM | POA: Diagnosis not present

## 2021-09-30 DIAGNOSIS — R112 Nausea with vomiting, unspecified: Secondary | ICD-10-CM | POA: Diagnosis not present

## 2021-09-30 HISTORY — DX: Malignant (primary) neoplasm, unspecified: C80.1

## 2021-09-30 LAB — COMPREHENSIVE METABOLIC PANEL
ALT: 17 U/L (ref 0–44)
AST: 39 U/L (ref 15–41)
Albumin: 2.6 g/dL — ABNORMAL LOW (ref 3.5–5.0)
Alkaline Phosphatase: 225 U/L — ABNORMAL HIGH (ref 38–126)
Anion gap: 8 (ref 5–15)
BUN: 19 mg/dL (ref 8–23)
CO2: 35 mmol/L — ABNORMAL HIGH (ref 22–32)
Calcium: 12.1 mg/dL — ABNORMAL HIGH (ref 8.9–10.3)
Chloride: 96 mmol/L — ABNORMAL LOW (ref 98–111)
Creatinine, Ser: 0.94 mg/dL (ref 0.44–1.00)
GFR, Estimated: >60 mL/min (ref >60)
Glucose, Bld: 109 mg/dL — ABNORMAL HIGH (ref 70–99)
Potassium: 2.3 mmol/L — CL (ref 3.5–5.1)
Sodium: 139 mmol/L (ref 135–145)
Total Bilirubin: 0.6 mg/dL (ref 0.3–1.2)
Total Protein: 6.5 g/dL (ref 6.5–8.1)

## 2021-09-30 LAB — URINALYSIS, ROUTINE W REFLEX MICROSCOPIC
Bacteria, UA: NONE SEEN
Bilirubin Urine: NEGATIVE
Glucose, UA: NEGATIVE mg/dL
Hgb urine dipstick: NEGATIVE
Ketones, ur: NEGATIVE mg/dL
Nitrite: NEGATIVE
Protein, ur: NEGATIVE mg/dL
Specific Gravity, Urine: 1.02 (ref 1.005–1.030)
pH: 6 (ref 5.0–8.0)

## 2021-09-30 LAB — CBC
HCT: 31.8 % — ABNORMAL LOW (ref 36.0–46.0)
Hemoglobin: 9.4 g/dL — ABNORMAL LOW (ref 12.0–15.0)
MCH: 23.5 pg — ABNORMAL LOW (ref 26.0–34.0)
MCHC: 29.6 g/dL — ABNORMAL LOW (ref 30.0–36.0)
MCV: 79.5 fL — ABNORMAL LOW (ref 80.0–100.0)
Platelets: 354 10*3/uL (ref 150–400)
RBC: 4 MIL/uL (ref 3.87–5.11)
RDW: 14.7 % (ref 11.5–15.5)
WBC: 12.3 10*3/uL — ABNORMAL HIGH (ref 4.0–10.5)
nRBC: 0 % (ref 0.0–0.2)

## 2021-09-30 LAB — PHOSPHORUS: Phosphorus: 2.6 mg/dL (ref 2.5–4.6)

## 2021-09-30 LAB — MAGNESIUM: Magnesium: 1.7 mg/dL (ref 1.7–2.4)

## 2021-09-30 LAB — LIPASE, BLOOD: Lipase: 22 U/L (ref 11–51)

## 2021-09-30 MED ORDER — LIDOCAINE 5 % EX OINT
1.0000 | TOPICAL_OINTMENT | Freq: Two times a day (BID) | CUTANEOUS | Status: DC | PRN
Start: 2021-09-30 — End: 2021-10-21
  Administered 2021-10-01: 1 via TOPICAL
  Filled 2021-09-30: qty 35.44

## 2021-09-30 MED ORDER — HYDROMORPHONE HCL 1 MG/ML IJ SOLN
1.0000 mg | Freq: Once | INTRAMUSCULAR | Status: AC
  Administered 2021-09-30: 1 mg via INTRAVENOUS
  Filled 2021-09-30: qty 1

## 2021-09-30 MED ORDER — ONDANSETRON HCL 4 MG PO TABS
4.0000 mg | ORAL_TABLET | Freq: Four times a day (QID) | ORAL | Status: DC | PRN
  Administered 2021-10-17: 4 mg via ORAL
  Filled 2021-09-30: qty 1

## 2021-09-30 MED ORDER — SODIUM CHLORIDE 0.9 % IV SOLN: Freq: Once | INTRAVENOUS | Status: AC

## 2021-09-30 MED ORDER — ACETAMINOPHEN 650 MG RE SUPP: 650.0000 mg | Freq: Four times a day (QID) | RECTAL | Status: DC | PRN

## 2021-09-30 MED ORDER — PANTOPRAZOLE SODIUM 40 MG IV SOLR
40.0000 mg | INTRAVENOUS | Status: DC
Start: 2021-09-30 — End: 2021-10-03
  Administered 2021-09-30 – 2021-10-02 (×3): 40 mg via INTRAVENOUS
  Filled 2021-09-30 (×3): qty 10

## 2021-09-30 MED ORDER — ATORVASTATIN CALCIUM 40 MG PO TABS: 40.0000 mg | ORAL_TABLET | Freq: Every day | ORAL | Status: DC

## 2021-09-30 MED ORDER — PROCHLORPERAZINE EDISYLATE 10 MG/2ML IJ SOLN
5.0000 mg | Freq: Once | INTRAMUSCULAR | Status: AC
  Administered 2021-09-30: 5 mg via INTRAVENOUS
  Filled 2021-09-30: qty 2

## 2021-09-30 MED ORDER — ONDANSETRON HCL 4 MG/2ML IJ SOLN
4.0000 mg | Freq: Four times a day (QID) | INTRAMUSCULAR | Status: DC | PRN
  Administered 2021-10-01 – 2021-10-21 (×14): 4 mg via INTRAVENOUS
  Filled 2021-09-30 (×15): qty 2

## 2021-09-30 MED ORDER — ATORVASTATIN CALCIUM 40 MG PO TABS
40.0000 mg | ORAL_TABLET | Freq: Every day | ORAL | Status: DC
Start: 2021-09-30 — End: 2021-09-30

## 2021-09-30 MED ORDER — HYDROMORPHONE HCL 1 MG/ML IJ SOLN
1.0000 mg | INTRAMUSCULAR | Status: DC | PRN
  Administered 2021-09-30 – 2021-10-01 (×2): 1 mg via INTRAVENOUS
  Filled 2021-09-30 (×2): qty 1

## 2021-09-30 MED ORDER — FENTANYL CITRATE PF 50 MCG/ML IJ SOSY
50.0000 ug | PREFILLED_SYRINGE | Freq: Once | INTRAMUSCULAR | Status: AC
  Administered 2021-09-30: 50 ug via INTRAVENOUS

## 2021-09-30 MED ORDER — POTASSIUM CHLORIDE IN NACL 40-0.9 MEQ/L-% IV SOLN
INTRAVENOUS | Status: AC
  Filled 2021-09-30: qty 1000

## 2021-09-30 MED ORDER — CALCITONIN (SALMON) 200 UNIT/ML IJ SOLN
4.0000 [IU]/kg | Freq: Two times a day (BID) | INTRAMUSCULAR | Status: AC
  Administered 2021-09-30 – 2021-10-01 (×2): 236 [IU] via INTRAMUSCULAR
  Filled 2021-09-30 (×2): qty 1.18

## 2021-09-30 MED ORDER — SODIUM CHLORIDE 0.9 % IV BOLUS
1000.0000 mL | Freq: Once | INTRAVENOUS | Status: AC
Start: 2021-09-30 — End: 2021-09-30
  Administered 2021-09-30: 1000 mL via INTRAVENOUS

## 2021-09-30 MED ORDER — PANTOPRAZOLE SODIUM 40 MG PO TBEC: 40.0000 mg | DELAYED_RELEASE_TABLET | Freq: Every day | ORAL | Status: DC

## 2021-09-30 MED ORDER — POTASSIUM CHLORIDE CRYS ER 20 MEQ PO TBCR
40.0000 meq | EXTENDED_RELEASE_TABLET | Freq: Once | ORAL | Status: AC
Start: 2021-09-30 — End: 2021-09-30
  Administered 2021-09-30: 40 meq via ORAL
  Filled 2021-09-30: qty 2

## 2021-09-30 MED ORDER — ACETAMINOPHEN 325 MG PO TABS
650.0000 mg | ORAL_TABLET | Freq: Four times a day (QID) | ORAL | Status: DC | PRN
  Administered 2021-10-12: 650 mg via ORAL
  Filled 2021-09-30: qty 2

## 2021-09-30 MED ORDER — MAGNESIUM SULFATE 2 GM/50ML IV SOLN
2.0000 g | Freq: Once | INTRAVENOUS | Status: AC
  Administered 2021-09-30: 2 g via INTRAVENOUS
  Filled 2021-09-30: qty 50

## 2021-09-30 MED ORDER — SODIUM CHLORIDE 0.9 % IV SOLN: INTRAVENOUS | Status: DC

## 2021-09-30 MED ORDER — ZOLEDRONIC ACID 4 MG/5ML IV CONC
4.0000 mg | Freq: Once | INTRAVENOUS | Status: AC
  Administered 2021-09-30: 4 mg via INTRAVENOUS
  Filled 2021-09-30: qty 5

## 2021-09-30 MED ORDER — ONDANSETRON HCL 4 MG/2ML IJ SOLN
4.0000 mg | Freq: Once | INTRAMUSCULAR | Status: AC
  Administered 2021-09-30: 4 mg via INTRAVENOUS
  Filled 2021-09-30: qty 2

## 2021-09-30 MED ORDER — POTASSIUM CHLORIDE 10 MEQ/100ML IV SOLN
10.0000 meq | INTRAVENOUS | Status: AC
  Administered 2021-09-30 (×3): 10 meq via INTRAVENOUS
  Filled 2021-09-30: qty 100

## 2021-09-30 MED ORDER — ONDANSETRON 4 MG PO TBDP: 4.0000 mg | ORAL_TABLET | Freq: Once | ORAL | Status: DC | PRN

## 2021-09-30 NOTE — Progress Notes (Signed)
Message received via MyChart:  We've had a rough weekend.  Mom has gone from walking and talking to confused, stumbling, angry.  She went to [facility] last night but they did little for her and told her to stop the hydrocodone which I think is making her sick.  This morning she can't swallow Tylenol and is still very confused and in pain.  They called in [facility] last night so we're not sure what to do.  She is now 24 hr full time unable to take care of basic needs.   Please talk with our Dr and get [place] something to get Mom back to baseline.  Reviewed patient's ED visit with Dr Marin Olp. Calcium was noted to be 14.4 and there is nor documentation from the ED that this was addressed. Dr Marin Olp would like patient to go to [facility] ED ASAP.   Called and spoke to patient's daughter, Sena Slate. Instructed her to take patient to [facility] ED and she agreed.   Called the [facility] ED Charge RN, Abby, and provided her with a short report of why we were sending patient to the ED.   Patient is scheduled for biopsy tomorrow. Dr Marin Olp is aware and will attempt to arrange inpatient if patient is admitted.   Oncology Nurse Navigator Documentation     09/30/2021    8:00 AM  Oncology Nurse Navigator Flowsheets  Navigator Follow Up Date: 10/01/2021  Navigator Follow Up Reason: Surgery  Navigator Location [facility]  Navigator Encounter Type MyChart;Telephone  Telephone Education;Symptom Mgt;Incoming Call  Patient Visit Type MedOnc  Treatment Phase Abnormal Scans  Barriers/Navigation Needs Coordination of Care;Education;Family Concerns  Education Other  Interventions Education;Psycho-Social Support  Acuity Level 2-Minimal Needs (1-2 Barriers Identified)  Education Method Verbal  Support Groups/Services Friends and Family  Time Spent with Patient 75

## 2021-09-30 NOTE — ED Notes (Signed)
Pt will let us know we she has to urine

## 2021-09-30 NOTE — Consult Note (Signed)
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  HPI: Jane Kennedy is a very charming 76 year old white female.  I first saw her a couple weeks ago.  She was found to have a left renal mass and bony and liver metastasis.  Unfortunately, the day that I saw her, her husband passed away.  As such, her priority was taken care of the services for her husband.  We had set her up with a biopsy.  We did do some other studies on her.  We did an MRI of the brain which was negative.  We did an MRI of the lumbar spine which showed a pathologic fracture at L2.  She began to have more problems with pain.  She is lost weight.  She not eating well.  She apparently went to a local hospital and was found to have a calcium of 14.4.  She subsequently was discharged from the emergency room.  Her daughter called this morning.  She said that her mother was confused and not eating.  She subsequently was admitted.  Today, her calcium was 12.3 with an albumin of 2.6.  Her potassium was 2.3.  Her blood sugar was 109.  She looks quite pale.  I suspect that she is quite anemic.  Her hemoglobin was 9.4.  However, with hydration after this will go down.  She is iron deficient.  When we saw her in the office, her iron saturation was 10%.  She seems to be a little more alert than I would have thought.  We had planned on giving her targeted therapy along with immunotherapy once we have a diagnosis.  She will hopefully be able to eat little bit better when she has her calcium control.  I will go ahead and give her a dose of calcitonin.  This will hopefully bring her calcium down little more quickly.  I will also give her dose of Zometa.  Again, we still trying to make a diagnosis.  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  She is having back discomfort.  We will see if Interventional Radiology can do a vertebroplasty on the L2 vertebral body.  She will definitely need Radiation Oncology to see her.  Currently, I would say performance status is probably ECOG 2.    Past Medical History:  Diagnosis Date   Cancer ([place])    Hyperlipidemia    Vertigo   :   Past Surgical History:  Procedure Laterality Date   CARPAL TUNNEL RELEASE Left    REPLACEMENT TOTAL HIP W/  RESURFACING IMPLANTS Bilateral   :   Current Facility-Administered Medications:    0.9 % NaCl with KCl 40 mEq / L  infusion, , Intravenous, Continuous, Reubin Milan, MD   acetaminophen (TYLENOL) tablet 650 mg, 650 mg, Oral, Q6H PRN **OR** acetaminophen (TYLENOL) suppository 650 mg, 650 mg, Rectal, Q6H PRN, Reubin Milan, MD   atorvastatin (LIPITOR) tablet 40 mg, 40 mg, Oral, Daily, Reubin Milan, MD   calcitonin (MIACALCIN) injection 236 Units, 4 Units/kg, Intramuscular, BID, Karandeep Resende, Rudell Cobb, MD   lidocaine (XYLOCAINE) 5 % ointment 1 application., 1 application., Topical, BID PRN, Reubin Milan, MD   magnesium sulfate IVPB 2 g 50 mL, 2 g, Intravenous, Once, Reubin Milan, MD   ondansetron [facility]) tablet 4  mg, 4 mg, Oral, Q6H PRN **OR** ondansetron (ZOFRAN) injection 4 mg, 4 mg, Intravenous, Q6H PRN, Reubin Milan, MD   ondansetron [facility]) injection 4 mg, 4 mg, Intravenous, Once, Reubin Milan, MD   pantoprazole (PROTONIX) EC tablet 40 mg, 40 mg, Oral, Daily, Reubin Milan, MD:   atorvastatin  40 mg Oral Daily   calcitonin  4 Units/kg Intramuscular BID   ondansetron (ZOFRAN) IV  4 mg Intravenous Once   pantoprazole  40 mg Oral Daily  :   Allergies  Allergen Reactions   Codeine Nausea And Vomiting   Nickel Rash    Patient states that nickel causes her skin to turn green and breakout.  :  History reviewed. No pertinent family history.:   Social History   Socioeconomic  History   Marital status: Widowed    Spouse name: Not on file   Number of children: Not on file   Years of education: Not on file   Highest education level: Not on file  Occupational History   Not on file  Tobacco Use   Smoking status: Former    Packs/day: 1.00    Types: Cigarettes    Quit date: 2018    Years since quitting: 5.4   Smokeless tobacco: Never  Vaping Use   Vaping Use: Never used  Substance and Sexual Activity   Alcohol use: Never   Drug use: Never   Sexual activity: Not on file  Other Topics Concern   Not on file  Social History Narrative   Not on file   Social Determinants of Health   Financial Resource Strain: Not on file  Food Insecurity: Not on file  Transportation Needs: Not on file  Physical Activity: Not on file  Stress: Not on file  Social Connections: Not on file  Intimate Partner Violence: Not on file  :  Review of Systems  Constitutional:  Positive for malaise/fatigue and weight loss.  HENT: Negative.    Eyes: Negative.   Respiratory:  Positive for shortness of breath.   Cardiovascular: Negative.   Gastrointestinal:  Positive for nausea and vomiting.  Genitourinary: Negative.   Musculoskeletal:  Positive for back pain, joint pain and neck pain.  Skin: Negative.   Neurological: Negative.   Endo/Heme/Allergies: Negative.   Psychiatric/Behavioral: Negative.      Exam: Patient Vitals for the past 24 hrs:  BP Temp Temp src Pulse Resp SpO2 Height Weight  09/30/21 1522 136/62 98.2 F (36.8 C) -- 89 16 94 % -- --  09/30/21 1445 128/60 97.9 F (36.6 C) Oral 88 16 98 % -- --  09/30/21 1400 (!) 125/56 -- -- 86 16 97 % -- --  09/30/21 1330 135/62 -- -- 92 16 92 % -- --  09/30/21 1200 (!) 127/58 -- -- 81 15 (!) 87 % -- --  09/30/21 1130 (!) 125/52 -- -- 83 20 93 % -- --  09/30/21 1100 (!) 124/58 -- -- 87 19 95 % -- --  09/30/21 1026 -- -- -- -- -- -- '5\' 3"'$  (1.6 m) 130 lb (59 kg)  09/30/21 1020 140/62 97.8 F (36.6 C) Oral 86 18 97 % -- --    Physical Exam Vitals reviewed.  HENT:     Head: Normocephalic and atraumatic.  Eyes:     Pupils: Pupils are equal, round, and reactive to light.  Cardiovascular:     Rate and Rhythm: Normal rate and regular rhythm.     Heart sounds: Normal heart sounds.  Pulmonary:  Effort: Pulmonary effort is normal.     Breath sounds: Normal breath sounds.  Abdominal:     General: Bowel sounds are normal.     Palpations: Abdomen is soft.  Musculoskeletal:        General: No tenderness or deformity. Normal range of motion.     Cervical back: Normal range of motion.  Lymphadenopathy:     Cervical: No cervical adenopathy.  Skin:    General: Skin is warm and dry.     Findings: No erythema or rash.  Neurological:     Mental Status: She is alert and oriented to person, place, and time.  Psychiatric:        Behavior: Behavior normal.        Thought Content: Thought content normal.        Judgment: Judgment normal.      Recent Labs    09/30/21 1055  WBC 12.3*  HGB 9.4*  HCT 31.8*  PLT 354    Recent Labs    09/30/21 1055  NA 139  K 2.3*  CL 96*  CO2 35*  GLUCOSE 109*  BUN 19  CREATININE 0.94  CALCIUM 12.1*    Blood smear review: None  Pathology: Pending    Assessment and Plan: Ms. Wannamaker is a very charming 76 year old white female.  She has metastatic kidney cancer from my point of view.  She has a left kidney mass.  She has bony metastasis.  She has liver metastasis.  Again, I had to believe this is going to be clear-cell carcinoma of the kidney but again we have to get a biopsy to make sure this is not sarcomatoid.  I think the histology will definitely give [place] an idea as to her prognosis.  The calcium needs to be controlled.  She will get Zometa.  I will give her some calcitonin.  This should bring it down.  She is getting IV fluids.  I suspect she will need to be transfused.  She is quite pale.  I talked her about a transfusion.  She would be in agreement with  this.  We need to have Interventional Radiology try to do a vertebroplasty on her.  Her nutritional status is quite poor.  I am sure that her nutritional intake will improve once she has her calcium better controlled and her pain better controlled.  We will try to get as much of the studies done while she is inpatient as we can.  It is difficult for her to get back and forth to the hospital.  I know she will get incredible care from all the staff on [address].  I do appreciate their compassion and there patience.   Jane Haw, MD  Romans 5:3-5

## 2021-09-30 NOTE — ED Provider Triage Note (Signed)
Emergency Medicine Provider Triage Evaluation Note  Altair Stanko , a 76 y.o. female  was evaluated in triage.  Pt complains of abnormal labs.  Patient with history of renal cell carcinoma, last seen at [facility] yesterday for altered mental status. Sent here for further work up due to elevated calcium level  Review of Systems  Positive: As above Negative: As above  Physical Exam  BP 140/62 (BP Location: Left Arm)   Pulse 86   Temp 97.8 F (36.6 C) (Oral)   Resp 18   Ht '5\' 3"'$  (1.6 m)   Wt 59 kg   SpO2 97%   BMI 23.03 kg/m  Gen:   Awake, no distress   Resp:  Normal effort  MSK:   Moves extremities without difficulty  Other:    Medical Decision Making  Medically screening exam initiated at 10:34 AM.  Appropriate orders placed.  Mykeisha Dysert was informed that the remainder of the evaluation will be completed by another provider, this initial triage assessment does not replace that evaluation, and the importance of remaining in the ED until their evaluation is complete.     Domenic Moras, PA-C 09/30/21 1036

## 2021-09-30 NOTE — ED Triage Notes (Addendum)
Patient has an elevated Calcium level and patient's daughter reports that the patient needed to come to the ED. Patient was seen at [facility] ED last night and was diagnosed with a UTI and antibiotics were presecribed. Patient c/o left lower abdominal pain and radiates into the left back. Patient also has had vomiting since yesterday.

## 2021-09-30 NOTE — H&P (Signed)
History and Physical    Patient: Jane Kennedy WUG:[id number] DOB: 02-02-1946 DOA: 09/30/2021 DOS: the patient was seen and examined on 09/30/2021 PCP: Houston Siren., MD  Patient coming from: Home  Chief Complaint:  Chief Complaint  Patient presents with   Abnormal Lab   Abdominal Pain   Back Pain   Emesis   HPI: Jane Kennedy is a 76 y.o. female with medical history significant of actinic keratosis, benign neoplasm of large intestine, carpal tunnel syndrome, lumbosacral DDD, diverticulosis, eczema, GERD, hyperlipidemia, impaired fasting glucose, IBS, osteopenia, seasonal allergies, vertigo, vitamin D deficiency who presented to the emergency department due to having an elevated calcium, but she has been having epigastric pain associated with multiple episodes of nausea and emesis since Friday.  She went to the [facility] emergency department yesterday and was given cephalexin for bacteriuria.  Urine analysis seen on the care everywhere tab with no significant suspicion for UTI.  She was given a cephalexin prescription, which she has not filled out.  According to her daughter and son, she has been confused at times this past few days.  In addition to abdominal pain, she has had fatigue, malaise and recent weight loss.  She has had constipation for the past 3+ days.  No melena or hematochezia.  Positive chills, but no fever, night sweats, rhinorrhea, sore throat, wheezing or hemoptysis.  No chest pain, palpitations, diaphoresis, PND, orthopnea or pitting edema of the lower extremities.  No flank pain, dysuria or hematuria.  No polyuria, polydipsia, polyphagia or blurred vision.  She is scheduled for biopsy with interventional radiology yesterday after recent imaging seen with high suspicion for renal cell carcinoma.   ED course: Initial vital signs were temperature 97.8 F, pulse 86, respiration 18, BP 140/62 mmHg O2 sat 97% on room air.  The patient received fentanyl 50 mcg IVP, KCl 10 mEq IVPB x3,  KCl 40 mEq p.o. x1 and 1000 mL IV bolus.  Lab work: Her urinalysis had small leukocyte esterase, but was otherwise normal.  CBC showed a white count of 12.3, hemoglobin 9.4 g/dL and platelets 354.  CMP showed a sodium 139, potassium 2.3, chloride 96 and CO2 35 mmol/L.  Renal function was normal.  Glucose 109 and calcium 12.1 mg/dL.  LFTs with an albumin of 2.6 g/dL and alkaline phosphatase 225 units/L.  The rest of the hepatic functions were normal.  Lipase was 23 units/L.  Magnesium 1.7 mg/dL and phosphorus 2.6 mg/dL.   Review of Systems: As mentioned in the history of present illness. All other systems reviewed and are negative.  Past Medical History:  Diagnosis Date   Cancer ([place])    Hyperlipidemia    Vertigo    Past Surgical History:  Procedure Laterality Date   CARPAL TUNNEL RELEASE Left    REPLACEMENT TOTAL HIP W/  RESURFACING IMPLANTS Bilateral    Social History:  reports that she quit smoking about 5 years ago. Her smoking use included cigarettes. She smoked an average of 1 pack per day. She has never used smokeless tobacco. She reports that she does not drink alcohol and does not use drugs.  Allergies  Allergen Reactions   Codeine Nausea And Vomiting   Nickel Rash    Patient states that nickel causes her skin to turn green and breakout.    Family History  Problem Relation Age of Onset   Migraines Mother    Multiple myeloma Mother    Diabetes Mellitus II Father    Stroke Maternal Grandfather  Colon cancer Maternal Aunt    Breast cancer Maternal Aunt     Prior to Admission medications   Medication Sig Start Date End Date Taking? Authorizing Provider  acetaminophen (TYLENOL) 325 MG tablet Take 325 mg by mouth once. pain   Yes [provider]  amitriptyline (ELAVIL) 100 MG tablet Take 100 mg by mouth daily.   Yes [provider]  atorvastatin (LIPITOR) 40 MG tablet Take 40 mg by mouth daily. 09/18/21  Yes [provider]  Calcium Carbonate  Antacid (TUMS PO) Take 2 tablets by mouth daily as needed (acid reflux).   Yes [provider]  ibuprofen (ADVIL) 200 MG tablet Take 200-400 mg by mouth 2 (two) times daily as needed for moderate pain.   Yes [provider]  lidocaine (XYLOCAINE) 5 % ointment Apply 1 application. topically 2 (two) times daily as needed (back pain).   Yes [provider]  omeprazole (PRILOSEC) 20 MG capsule Take 20 mg by mouth daily. 07/01/21  Yes [provider]  ondansetron (ZOFRAN) 8 MG tablet Take 1 tablet (8 mg total) by mouth every 8 (eight) hours as needed for nausea or vomiting. 09/20/21  Yes Ennever, Rudell Cobb, MD  cephALEXin (KEFLEX) 500 MG capsule Take 500 mg by mouth 2 (two) times daily. Patient not taking: Reported on 09/30/2021 09/29/21   [provider]  HYDROcodone-acetaminophen (NORCO/VICODIN) 5-325 MG tablet Take 1-2 tablets by mouth every 4 (four) hours as needed for moderate pain. Max 4-6 tabs a day Patient not taking: Reported on 09/30/2021 09/17/21   Volanda Napoleon, MD  Ibuprofen-diphenhydrAMINE HCl (IBUPROFEN PM) 200-25 MG CAPS Take 1 tablet by mouth at bedtime as needed (sleep). Patient not taking: Reported on 09/30/2021    [provider]  ondansetron (ZOFRAN-ODT) 4 MG disintegrating tablet Take by mouth. Patient not taking: Reported on 09/30/2021 09/29/21   [provider]  traMADol Veatrice Bourbon) 50 MG tablet  09/29/21   [provider]    Physical Exam: Vitals:   09/30/21 1330 09/30/21 1400 09/30/21 1445 09/30/21 1522  BP: 135/62 (!) 125/56 128/60 136/62  Pulse: 92 86 88 89  Resp: _0 Temp:   97.9 F (36.6 C) 98.2 F (36.8 C)  TempSrc:   Oral   SpO2: 92% 97% 98% 94%  Weight:      Height:       Physical Exam Vitals and nursing note reviewed.  Constitutional:      Appearance: She is well-developed and normal weight. She is ill-appearing.  HENT:     Head: Normocephalic.     Mouth/Throat:     Mouth: Mucous membranes  are moist.     Comments: Her lips were dry. Eyes:     General: No scleral icterus. Neck:     Vascular: No JVD.  Cardiovascular:     Rate and Rhythm: Normal rate and regular rhythm.     Heart sounds: S1 normal and S2 normal.  Pulmonary:     Effort: Pulmonary effort is normal.     Breath sounds: Normal breath sounds. No wheezing, rhonchi or rales.  Abdominal:     General: Bowel sounds are normal.     Palpations: Abdomen is soft.     Tenderness: There is no abdominal tenderness.     Comments: High recently received hydromorphone before examination.  Musculoskeletal:     Cervical back: Neck supple.     Right lower leg: No edema.     Left lower leg: No  edema.  Skin:    General: Skin is warm and dry.  Neurological:     General: No focal deficit present.     Mental Status: She is alert.  Psychiatric:        Mood and Affect: Mood normal.        Behavior: Behavior normal.     Data Reviewed:  There are no new results to review at this time.  Assessment and Plan: Principal Problem:   Hypercalcemia Having abdominal pain, and N/V. Observation/telemetry. Clear liquid diet. N.p.o. after midnight. Continue IV fluids. Started on calcitonin. Analgesics as needed. Antiemetics as needed. Pantoprazole 40 mg IVP every 24 hours. Follow-up CBC and CMP in AM. Oncology consult appreciated.  Active Problems:   Hypokalemia Replenishing. Magnesium was supplemented. Follow-up potassium level.    Left kidney mass She is scheduled for biopsy tomorrow. Dr. Marin Olp input appreciated.    Moderate protein malnutrition (HCC) Protein supplementation. Consider nutritional services evaluation.    Gastroesophageal reflux disease with esophagitis Protonix 40 mg IVP daily.    Hyperlipidemia, unspecified Hold atorvastatin until tomorrow.      Advance Care Planning:   Code Status: Full Code   Consults: Oncology and interventional radiology.  Family Communication: Her daughter and son  were at bedside.  Severity of Illness: The appropriate patient status for this patient is OBSERVATION. Observation status is judged to be reasonable and necessary in order to provide the required intensity of service to ensure the patient's safety. The patient's presenting symptoms, physical exam findings, and initial radiographic and laboratory data in the context of their medical condition is felt to place them at decreased risk for further clinical deterioration. Furthermore, it is anticipated that the patient will be medically stable for discharge from the hospital within 2 midnights of admission.   Author: Reubin Milan, MD 09/30/2021 5:07 PM  For on call review [email or web address].   This document was prepared using Dragon voice recognition software and may contain some unintended transcription errors.

## 2021-09-30 NOTE — ED Provider Notes (Signed)
[facility] DEPT Provider Note   CSN: [id number] Arrival date & time: 09/30/21  4166     History  Chief Complaint  Patient presents with   Abnormal Lab   Abdominal Pain   Back Pain   Emesis    Marjo Grosvenor is a 76 y.o. female.  Patient here after calling her oncologist today who reviewed her ED visit from yesterday and she was found to have a calcium of 14.  She has CT scan of her head, abdomen and pelvis and blood work for confusion, abdominal pain.  She has needed diagnosis of suspected renal cell carcinoma.  Is supposed to get a biopsy tomorrow.  Has not started treatment.  She has been having some nausea symptoms and abdominal cramping.  She was started on an antibiotic for possible urine infection but has not filled antibiotic.  After talking with her oncologist they were told to come here for admission given high calcium.  No falls or anything new since yesterday.  Just still confused at times, nausea and cramping.  The history is provided by the patient and a caregiver.      Home Medications Prior to Admission medications   Medication Sig Start Date End Date Taking? Authorizing Provider  acetaminophen (TYLENOL) 325 MG tablet Take 325 mg by mouth once. pain   Yes [provider]  amitriptyline (ELAVIL) 100 MG tablet Take 100 mg by mouth daily.   Yes [provider]  atorvastatin (LIPITOR) 40 MG tablet Take 40 mg by mouth daily. 09/18/21  Yes [provider]  Calcium Carbonate Antacid (TUMS PO) Take 2 tablets by mouth daily as needed (acid reflux).   Yes [provider]  ibuprofen (ADVIL) 200 MG tablet Take 200-400 mg by mouth 2 (two) times daily as needed for moderate pain.   Yes [provider]  lidocaine (XYLOCAINE) 5 % ointment Apply 1 application. topically 2 (two) times daily as needed (back pain).   Yes [provider]  omeprazole (PRILOSEC) 20 MG capsule Take 20 mg by mouth daily. 07/01/21   Yes [provider]  ondansetron (ZOFRAN) 8 MG tablet Take 1 tablet (8 mg total) by mouth every 8 (eight) hours as needed for nausea or vomiting. 09/20/21  Yes Ennever, Rudell Cobb, MD  cephALEXin (KEFLEX) 500 MG capsule Take 500 mg by mouth 2 (two) times daily. Patient not taking: Reported on 09/30/2021 09/29/21   [provider]  HYDROcodone-acetaminophen (NORCO/VICODIN) 5-325 MG tablet Take 1-2 tablets by mouth every 4 (four) hours as needed for moderate pain. Max 4-6 tabs a day Patient not taking: Reported on 09/30/2021 09/17/21   Volanda Napoleon, MD  Ibuprofen-diphenhydrAMINE HCl (IBUPROFEN PM) 200-25 MG CAPS Take 1 tablet by mouth at bedtime as needed (sleep). Patient not taking: Reported on 09/30/2021    [provider]  ondansetron (ZOFRAN-ODT) 4 MG disintegrating tablet Take by mouth. Patient not taking: Reported on 09/30/2021 09/29/21   [provider]  traMADol Veatrice Bourbon) 50 MG tablet  09/29/21   [provider]      Allergies    Codeine and Nickel    Review of Systems   Review of Systems  Physical Exam Updated Vital Signs BP (!) 127/58   Pulse 81   Temp 97.8 F (36.6 C) (Oral)   Resp 15   Ht '5\' 3"'$  (1.6 m)   Wt 59 kg   SpO2 (!) 87%   BMI 23.03 kg/m  Physical Exam Vitals and nursing note reviewed.  Constitutional:      General: She is not in acute distress.    Appearance: She is well-developed. She is ill-appearing.  HENT:     Head: Normocephalic and atraumatic.  Eyes:     Conjunctiva/sclera: Conjunctivae normal.  Cardiovascular:     Rate and Rhythm: Normal rate and regular rhythm.     Heart sounds: Normal heart sounds. No murmur heard. Pulmonary:     Effort: Pulmonary effort is normal. No respiratory distress.     Breath sounds: Normal breath sounds.  Abdominal:     Palpations: Abdomen is soft.     Tenderness: There is abdominal tenderness in the left upper quadrant and left lower quadrant.  Musculoskeletal:        General: No  swelling.     Cervical back: Neck supple.  Skin:    General: Skin is warm and dry.     Capillary Refill: Capillary refill takes less than 2 seconds.  Neurological:     Mental Status: She is alert.  Psychiatric:        Mood and Affect: Mood normal.    ED Results / Procedures / Treatments   Labs (all labs ordered are listed, but only abnormal results are displayed) Labs Reviewed  COMPREHENSIVE METABOLIC PANEL - Abnormal; Notable for the following components:      Result Value   Potassium 2.3 (*)    Chloride 96 (*)    CO2 35 (*)    Glucose, Bld 109 (*)    Calcium 12.1 (*)    Albumin 2.6 (*)    Alkaline Phosphatase 225 (*)    All other components within normal limits  CBC - Abnormal; Notable for the following components:   WBC 12.3 (*)    Hemoglobin 9.4 (*)    HCT 31.8 (*)    MCV 79.5 (*)    MCH 23.5 (*)    MCHC 29.6 (*)    All other components within normal limits  URINE CULTURE  LIPASE, BLOOD  URINALYSIS, ROUTINE W REFLEX MICROSCOPIC  CALCIUM, IONIZED  MAGNESIUM    EKG EKG Interpretation  Date/Time:  Monday September 30 2021 10:58:03 EDT Ventricular Rate:  82 PR Interval:  153 QRS Duration: 99 QT Interval:  371 QTC Calculation: 434 R Axis:   7 Text Interpretation: Sinus rhythm Abnormal R-wave progression, early transition Borderline T abnormalities, anterior leads Confirmed by Ronnald Nian, Tadan Shill ([id number]) on 09/30/2021 11:03:48 AM  Radiology No results found.  Procedures .Critical Care Performed by: Lennice Sites, DO Authorized by: Lennice Sites, DO   Critical care provider statement:    Critical care time (minutes):  25   Critical care was necessary to treat or prevent imminent or life-threatening deterioration of the following conditions: hypercalcemia.   Critical care was time spent personally by me on the following activities:  Blood draw for specimens, development of treatment plan with patient or surrogate, discussions with primary provider, evaluation of  patient's response to treatment, examination of patient, obtaining history from patient or surrogate, ordering and performing treatments and interventions, ordering and review of laboratory studies, pulse oximetry, re-evaluation of patient's condition, ordering and review of radiographic studies and review of old charts   Care discussed with: admitting provider      Medications Ordered in ED Medications  ondansetron (ZOFRAN-ODT) disintegrating tablet 4 mg (has no administration in time range)  0.9 %  sodium chloride infusion (has no administration in time range)  potassium chloride 10 mEq in 100 mL IVPB (has no administration in time  range)  potassium chloride SA (KLOR-CON M) CR tablet 40 mEq (has no administration in time range)  sodium chloride 0.9 % bolus 1,000 mL (1,000 mLs Intravenous New Bag/Given 09/30/21 1053)  fentaNYL (SUBLIMAZE) injection 50 mcg (50 mcg Intravenous Given 09/30/21 1115)    ED Course/ Medical Decision Making/ A&P                           Medical Decision Making Amount and/or Complexity of Data Reviewed Labs: ordered.  Risk Prescription drug management. Decision regarding hospitalization.   Sugar Vanzandt is here due to high calcium levels.  Currently suspected that she has a renal cell carcinoma.  She is post to get biopsy for diagnosis tomorrow.  Called her oncologist today after being seen in the ED at outside facility yesterday for confusion, nausea, vomiting, abdominal pain.  She has CT scan of her head, abdomen and pelvis at that time.  Scans appear to be unremarkable.  But upon chart review of her lab work her calcium was 14 and her oncologist sent her for hydration and admission.  She continues to have the symptoms.  She has normal vitals.  No fever.  Overall suspect her symptoms are from hypercalcemia.  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  We will recheck labs including CBC, CMP, lipase.  She was started on antibiotic for possible  urine infection.  We will recheck a urinalysis and send urine culture.  She does not seem to be having any urinary symptoms.  Urinalysis is somewhat equivocal yesterday.  We will start hydration with normal saline bolus and will start maintenance fluids.  We will check other electrolytes including EKG.  Calcium is improving now down to 12.1.  However potassium lower at 2.3.  We will continue hydration and replete potassium both orally and IV.  Will admit to medicine for further care.  Per my review and interpretation of labs there is no other major findings.  This chart was dictated using voice recognition software.  Despite best efforts to proofread,  errors can occur which can change the documentation meaning.         Final Clinical Impression(s) / ED Diagnoses Final diagnoses:  Hypercalcemia  Hypokalemia    Rx / DC Orders ED Discharge Orders     None         Lennice Sites, DO 09/30/21 1203

## 2021-10-01 ENCOUNTER — Observation Stay (HOSPITAL_COMMUNITY): Payer: Medicare HMO

## 2021-10-01 ENCOUNTER — Encounter (HOSPITAL_COMMUNITY): Payer: Self-pay | Admitting: Internal Medicine

## 2021-10-01 ENCOUNTER — Encounter (HOSPITAL_COMMUNITY): Payer: Self-pay

## 2021-10-01 ENCOUNTER — Encounter: Payer: Self-pay | Admitting: *Deleted

## 2021-10-01 ENCOUNTER — Ambulatory Visit (HOSPITAL_COMMUNITY)
Admission: RE | Admit: 2021-10-01 | Discharge: 2021-10-01 | Disposition: A | Discharge status: Home / Self Care | Payer: Medicare HMO | Source: Ambulatory Visit | Attending: Hematology & Oncology | Admitting: Hematology & Oncology

## 2021-10-01 DIAGNOSIS — C649 Malignant neoplasm of unspecified kidney, except renal pelvis: Secondary | ICD-10-CM | POA: Diagnosis not present

## 2021-10-01 DIAGNOSIS — Z833 Family history of diabetes mellitus: Secondary | ICD-10-CM | POA: Diagnosis not present

## 2021-10-01 DIAGNOSIS — S32020D Wedge compression fracture of second lumbar vertebra, subsequent encounter for fracture with routine healing: Secondary | ICD-10-CM | POA: Diagnosis not present

## 2021-10-01 DIAGNOSIS — C642 Malignant neoplasm of left kidney, except renal pelvis: Secondary | ICD-10-CM | POA: Diagnosis present

## 2021-10-01 DIAGNOSIS — G9341 Metabolic encephalopathy: Secondary | ICD-10-CM | POA: Diagnosis present

## 2021-10-01 DIAGNOSIS — C7951 Secondary malignant neoplasm of bone: Secondary | ICD-10-CM | POA: Diagnosis present

## 2021-10-01 DIAGNOSIS — K5900 Constipation, unspecified: Secondary | ICD-10-CM | POA: Diagnosis not present

## 2021-10-01 DIAGNOSIS — Z87891 Personal history of nicotine dependence: Secondary | ICD-10-CM | POA: Diagnosis not present

## 2021-10-01 DIAGNOSIS — Z79899 Other long term (current) drug therapy: Secondary | ICD-10-CM | POA: Diagnosis not present

## 2021-10-01 DIAGNOSIS — K219 Gastro-esophageal reflux disease without esophagitis: Secondary | ICD-10-CM | POA: Diagnosis not present

## 2021-10-01 DIAGNOSIS — Z6823 Body mass index (BMI) 23.0-23.9, adult: Secondary | ICD-10-CM | POA: Diagnosis not present

## 2021-10-01 DIAGNOSIS — E876 Hypokalemia: Secondary | ICD-10-CM | POA: Diagnosis present

## 2021-10-01 DIAGNOSIS — Z823 Family history of stroke: Secondary | ICD-10-CM | POA: Diagnosis not present

## 2021-10-01 DIAGNOSIS — D509 Iron deficiency anemia, unspecified: Secondary | ICD-10-CM | POA: Diagnosis present

## 2021-10-01 DIAGNOSIS — D500 Iron deficiency anemia secondary to blood loss (chronic): Secondary | ICD-10-CM | POA: Diagnosis not present

## 2021-10-01 DIAGNOSIS — E785 Hyperlipidemia, unspecified: Secondary | ICD-10-CM | POA: Diagnosis present

## 2021-10-01 DIAGNOSIS — Z7189 Other specified counseling: Secondary | ICD-10-CM | POA: Diagnosis not present

## 2021-10-01 DIAGNOSIS — R112 Nausea with vomiting, unspecified: Secondary | ICD-10-CM | POA: Diagnosis not present

## 2021-10-01 DIAGNOSIS — C787 Secondary malignant neoplasm of liver and intrahepatic bile duct: Secondary | ICD-10-CM | POA: Diagnosis present

## 2021-10-01 DIAGNOSIS — Z807 Family history of other malignant neoplasms of lymphoid, hematopoietic and related tissues: Secondary | ICD-10-CM | POA: Diagnosis not present

## 2021-10-01 DIAGNOSIS — M8458XA Pathological fracture in neoplastic disease, other specified site, initial encounter for fracture: Secondary | ICD-10-CM | POA: Diagnosis present

## 2021-10-01 DIAGNOSIS — Z66 Do not resuscitate: Secondary | ICD-10-CM | POA: Diagnosis present

## 2021-10-01 DIAGNOSIS — E440 Moderate protein-calorie malnutrition: Secondary | ICD-10-CM | POA: Diagnosis present

## 2021-10-01 DIAGNOSIS — E7220 Disorder of urea cycle metabolism, unspecified: Secondary | ICD-10-CM | POA: Diagnosis present

## 2021-10-01 DIAGNOSIS — Z515 Encounter for palliative care: Secondary | ICD-10-CM | POA: Diagnosis not present

## 2021-10-01 DIAGNOSIS — K2100 Gastro-esophageal reflux disease with esophagitis, without bleeding: Secondary | ICD-10-CM | POA: Diagnosis present

## 2021-10-01 LAB — CBC WITH DIFFERENTIAL/PLATELET
Abs Immature Granulocytes: 0.05 10*3/uL (ref 0.00–0.07)
Basophils Absolute: 0 10*3/uL (ref 0.0–0.1)
Basophils Relative: 0 %
Eosinophils Absolute: 0.1 10*3/uL (ref 0.0–0.5)
Eosinophils Relative: 1 %
HCT: 30.7 % — ABNORMAL LOW (ref 36.0–46.0)
Hemoglobin: 9.1 g/dL — ABNORMAL LOW (ref 12.0–15.0)
Immature Granulocytes: 1 %
Lymphocytes Relative: 13 %
Lymphs Abs: 1.4 10*3/uL (ref 0.7–4.0)
MCH: 23.9 pg — ABNORMAL LOW (ref 26.0–34.0)
MCHC: 29.6 g/dL — ABNORMAL LOW (ref 30.0–36.0)
MCV: 80.6 fL (ref 80.0–100.0)
Monocytes Absolute: 1.1 10*3/uL — ABNORMAL HIGH (ref 0.1–1.0)
Monocytes Relative: 10 %
Neutro Abs: 8.5 10*3/uL — ABNORMAL HIGH (ref 1.7–7.7)
Neutrophils Relative %: 75 %
Platelets: 338 10*3/uL (ref 150–400)
RBC: 3.81 MIL/uL — ABNORMAL LOW (ref 3.87–5.11)
RDW: 14.8 % (ref 11.5–15.5)
WBC: 11.1 10*3/uL — ABNORMAL HIGH (ref 4.0–10.5)
nRBC: 0 % (ref 0.0–0.2)

## 2021-10-01 LAB — BASIC METABOLIC PANEL
Anion gap: 8 (ref 5–15)
BUN: 14 mg/dL (ref 8–23)
CO2: 29 mmol/L (ref 22–32)
Calcium: 10 mg/dL (ref 8.9–10.3)
Chloride: 105 mmol/L (ref 98–111)
Creatinine, Ser: 0.79 mg/dL (ref 0.44–1.00)
GFR, Estimated: >60 mL/min (ref >60)
Glucose, Bld: 101 mg/dL — ABNORMAL HIGH (ref 70–99)
Potassium: 3.1 mmol/L — ABNORMAL LOW (ref 3.5–5.1)
Sodium: 142 mmol/L (ref 135–145)

## 2021-10-01 LAB — COMPREHENSIVE METABOLIC PANEL
ALT: 17 U/L (ref 0–44)
AST: 40 U/L (ref 15–41)
Albumin: 2.5 g/dL — ABNORMAL LOW (ref 3.5–5.0)
Alkaline Phosphatase: 204 U/L — ABNORMAL HIGH (ref 38–126)
Anion gap: 8 (ref 5–15)
BUN: 13 mg/dL (ref 8–23)
CO2: 30 mmol/L (ref 22–32)
Calcium: 10.4 mg/dL — ABNORMAL HIGH (ref 8.9–10.3)
Chloride: 101 mmol/L (ref 98–111)
Creatinine, Ser: 0.75 mg/dL (ref 0.44–1.00)
GFR, Estimated: >60 mL/min (ref >60)
Glucose, Bld: 98 mg/dL (ref 70–99)
Potassium: 2.8 mmol/L — ABNORMAL LOW (ref 3.5–5.1)
Sodium: 139 mmol/L (ref 135–145)
Total Bilirubin: 0.6 mg/dL (ref 0.3–1.2)
Total Protein: 6.2 g/dL — ABNORMAL LOW (ref 6.5–8.1)

## 2021-10-01 LAB — URINE CULTURE: Culture: NO GROWTH

## 2021-10-01 LAB — TYPE AND SCREEN
ABO/RH(D): A POS
Antibody Screen: NEGATIVE

## 2021-10-01 LAB — PARATHYROID HORMONE, INTACT (NO CA): PTH: 11 pg/mL — ABNORMAL LOW (ref 15–65)

## 2021-10-01 LAB — BLOOD GAS, ARTERIAL
Acid-Base Excess: 6.9 mmol/L — ABNORMAL HIGH (ref 0.0–2.0)
Bicarbonate: 32 mmol/L — ABNORMAL HIGH (ref 20.0–28.0)
FIO2: 36 %
O2 Content: 4 L/min
O2 Saturation: 100 %
Patient temperature: 37
pCO2 arterial: 46 mmHg (ref 32–48)
pH, Arterial: 7.45 (ref 7.35–7.45)
pO2, Arterial: 90 mmHg (ref 83–108)

## 2021-10-01 LAB — GLUCOSE, CAPILLARY: Glucose-Capillary: 86 mg/dL (ref 70–99)

## 2021-10-01 LAB — ABO/RH: ABO/RH(D): A POS

## 2021-10-01 LAB — CALCIUM, IONIZED: Calcium, Ionized, Serum: 7.5 mg/dL — ABNORMAL HIGH (ref 4.5–5.6)

## 2021-10-01 LAB — PROTIME-INR
INR: 1 (ref 0.8–1.2)
Prothrombin Time: 13.3 seconds (ref 11.4–15.2)

## 2021-10-01 IMAGING — CT CT HEAD W/O CM
3 series · 17 of 37 positions shown, 19 images · non-contrast
Comparison: Correlation made with MRI [DATE]

CLINICAL DATA: Altered mental status



[Series 2: head wo · axial · 0.47mm/px · z∈[+1108,+1234]mm · 7 of 35 slices shown, 9 images]
[im 5/35  brain]
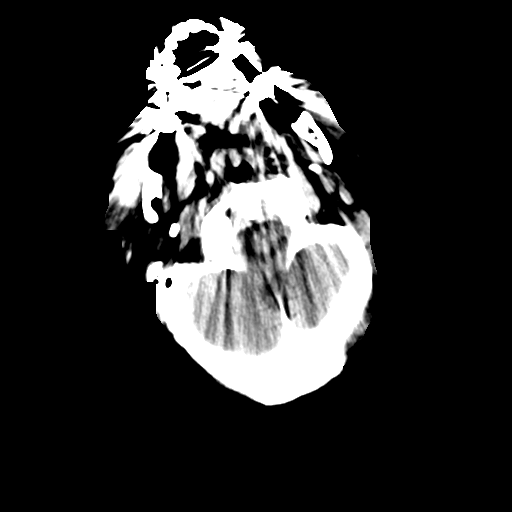
[im 5/35  bone]
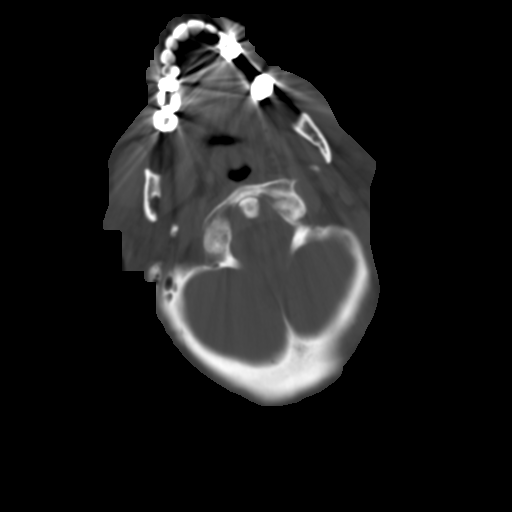
[im 9/35  brain]
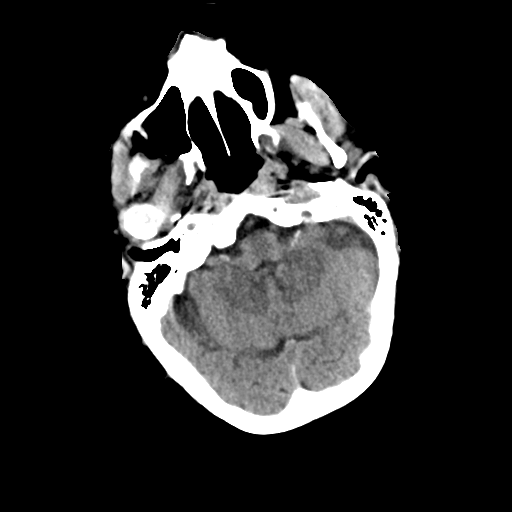
[im 13/35  brain]
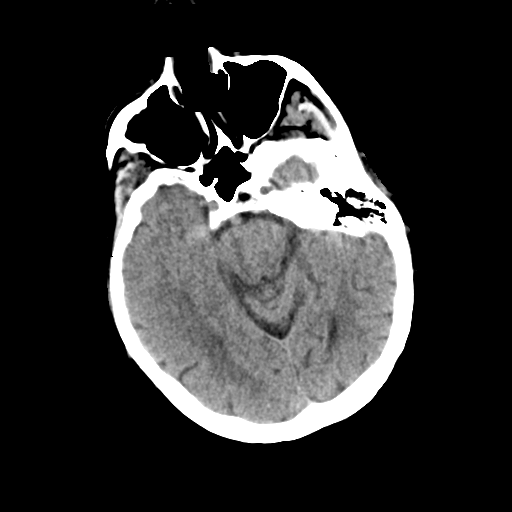
[im 18/35  brain]
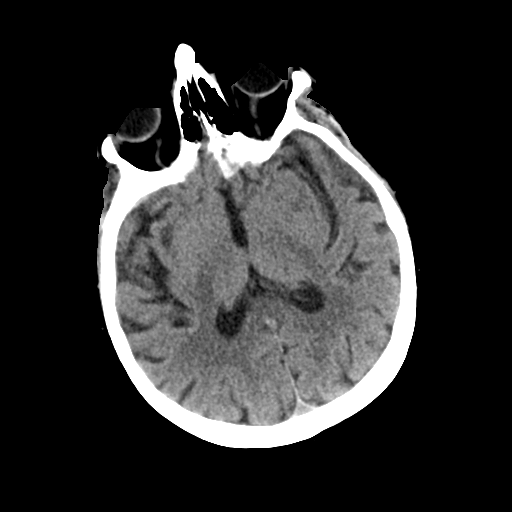
[im 22/35  brain]
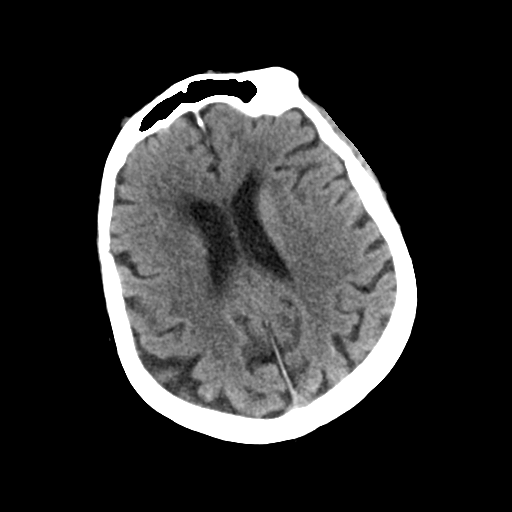
[im 22/35  bone]
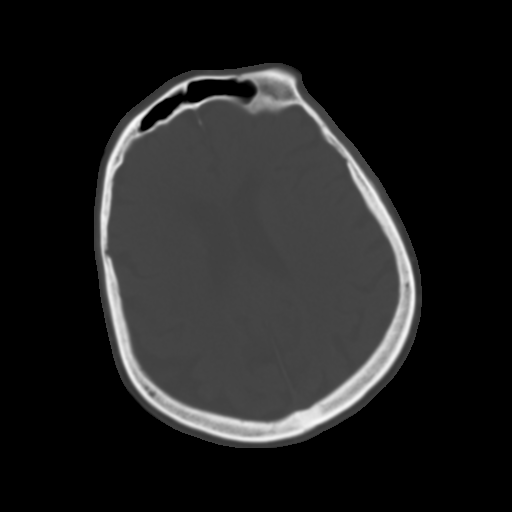
[im 26/35  brain]
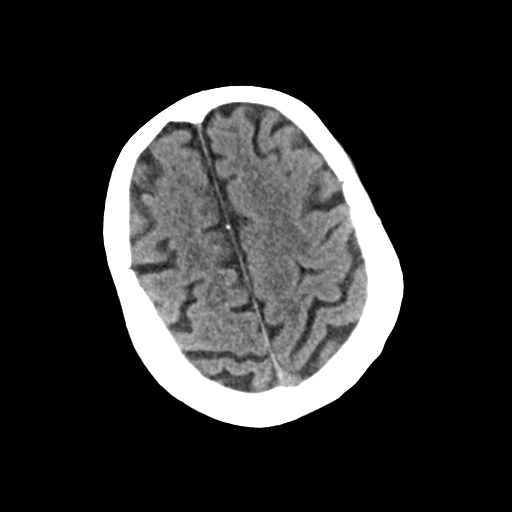
[im 30/35  brain]
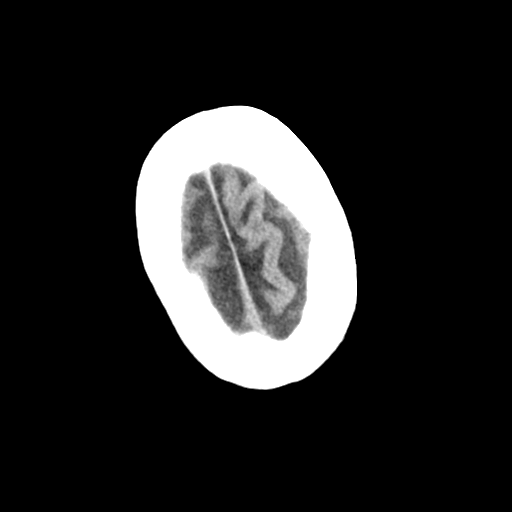

[Series 3: head bone · axial · 0.47mm/px · z∈[+1104,+1224]mm · 7 of 87 slices shown]
[im 9/87  bone]
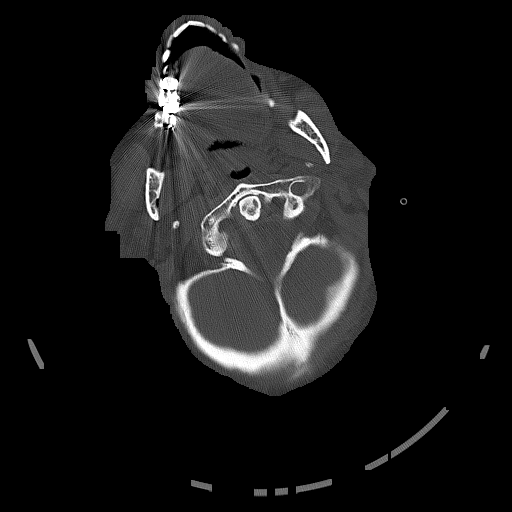
[im 18/87  bone]
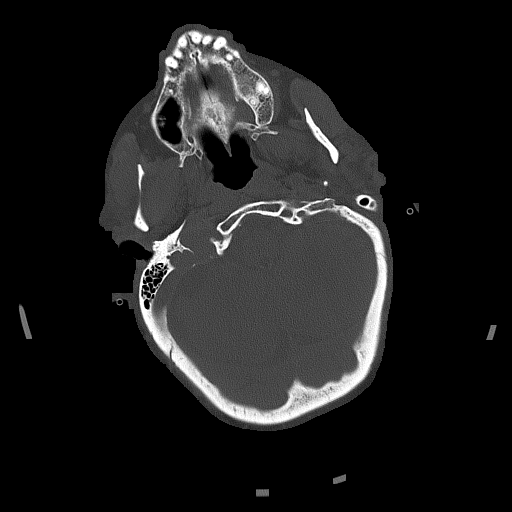
[im 26/87  bone]
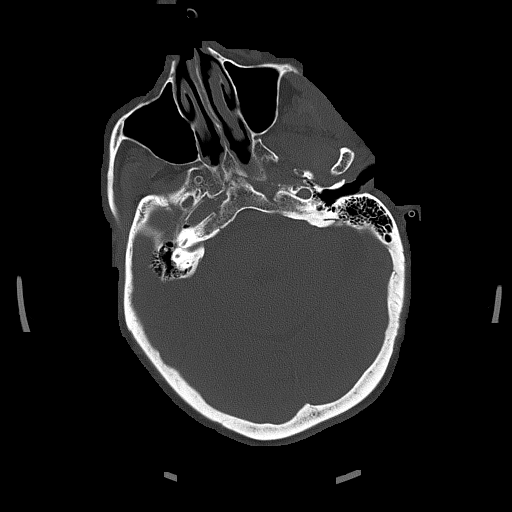
[im 39/87  bone]
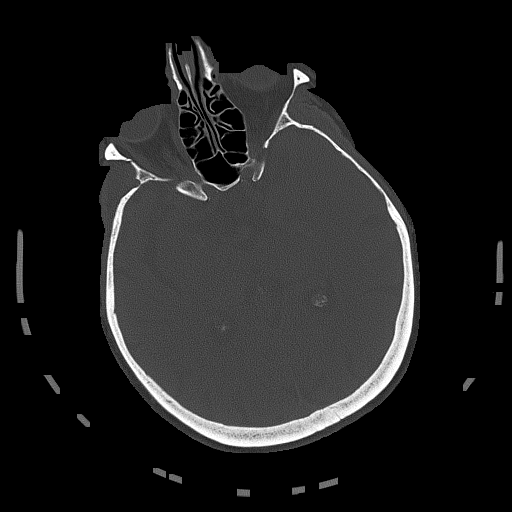
[im 48/87  bone]
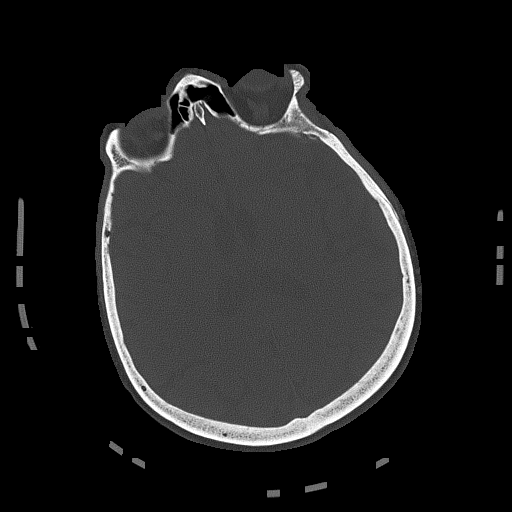
[im 61/87  bone]
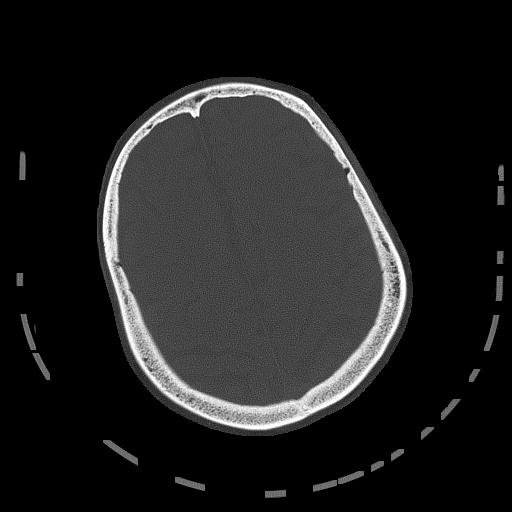
[im 69/87  bone]
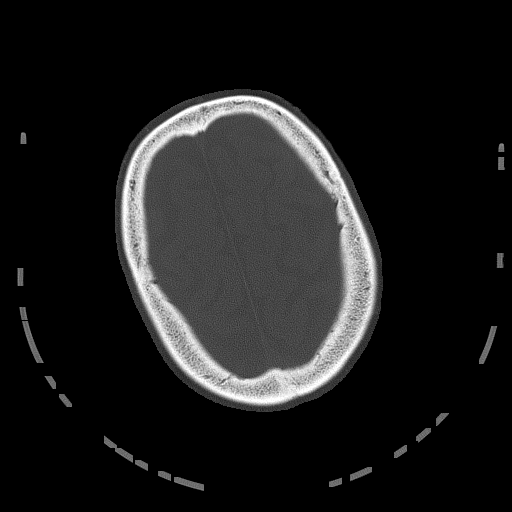

[Series 5: sagittal soft tissue · sagittal · 0.33mm/px · 3 of 56 slices shown]
[im 19/56  brain]
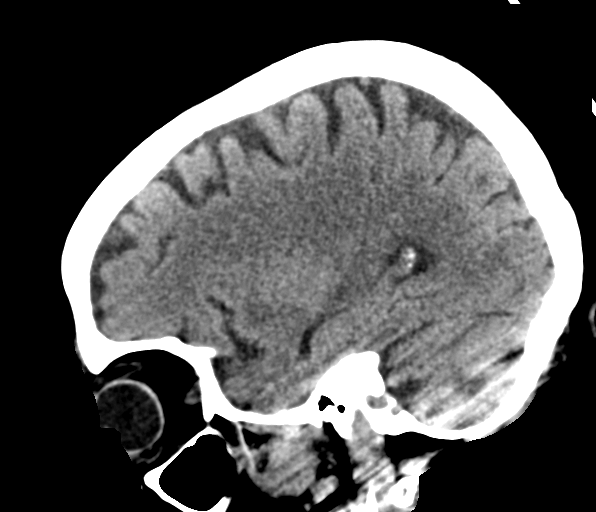
[im 28/56  brain]
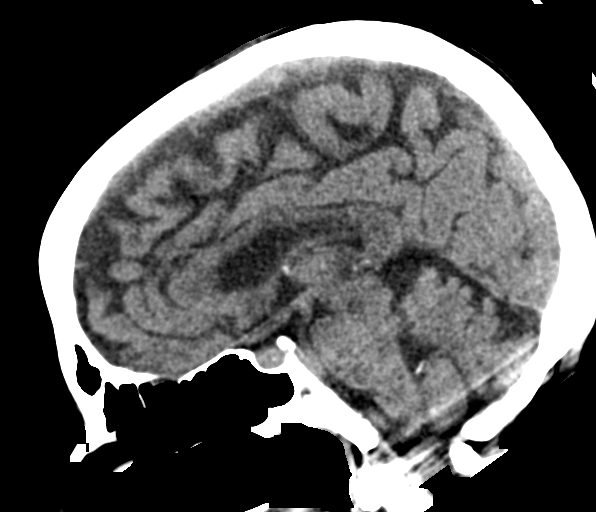
[im 37/56  brain]
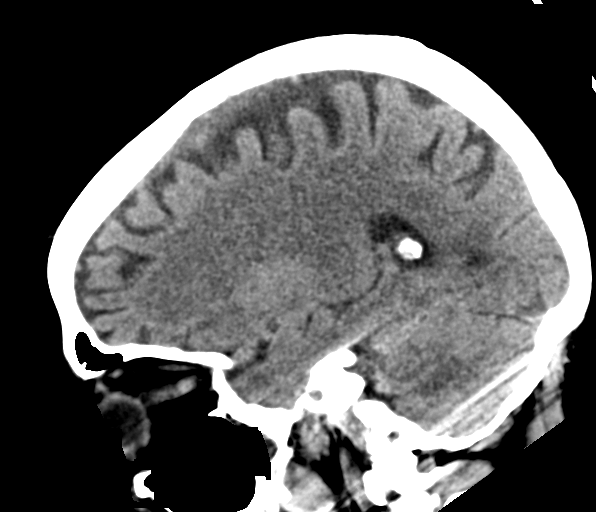

[17 of 37 positions shown; findings below may reference images not displayed]

FINDINGS: Brain: There is no acute intracranial hemorrhage, mass effect, or
edema. Gray-white differentiation is preserved. There is no
extra-axial fluid collection. Prominence of the ventricles and sulci
reflects mild parenchymal volume loss. Patchy hypoattenuation in the
supratentorial white matter is nonspecific but may reflect mild
chronic microvascular ischemic changes.

Vascular: There is mild atherosclerotic calcification at the skull
base.

Skull: Calvarium is unremarkable.

Sinuses/Orbits: No acute finding.

Other: Mastoid air cells are clear. There is no right temporalis
abnormality on CT corresponding to MR finding. However, that still
likely represents a venous malformation.
IMPRESSION: No acute intracranial abnormality.

## 2021-10-01 MED ORDER — LORAZEPAM 2 MG/ML IJ SOLN
0.5000 mg | INTRAMUSCULAR | Status: DC | PRN
  Administered 2021-10-10: 1 mg via INTRAVENOUS
  Filled 2021-10-01 (×2): qty 1

## 2021-10-01 MED ORDER — MAGNESIUM SULFATE 2 GM/50ML IV SOLN
2.0000 g | Freq: Once | INTRAVENOUS | Status: AC
  Administered 2021-10-01: 2 g via INTRAVENOUS
  Filled 2021-10-01: qty 50

## 2021-10-01 MED ORDER — LIP MEDEX EX OINT
TOPICAL_OINTMENT | CUTANEOUS | Status: DC | PRN
  Filled 2021-10-01 (×2): qty 7

## 2021-10-01 MED ORDER — POTASSIUM CHLORIDE 10 MEQ/100ML IV SOLN
10.0000 meq | INTRAVENOUS | Status: AC
  Administered 2021-10-01 (×5): 10 meq via INTRAVENOUS
  Filled 2021-10-01 (×5): qty 100

## 2021-10-01 MED ORDER — ENSURE ENLIVE PO LIQD: 237.0000 mL | Freq: Two times a day (BID) | ORAL | Status: DC

## 2021-10-01 MED ORDER — HYDROMORPHONE HCL 1 MG/ML IJ SOLN
0.5000 mg | INTRAMUSCULAR | Status: DC | PRN
  Administered 2021-10-01 – 2021-10-16 (×65): 0.5 mg via INTRAVENOUS
  Filled 2021-10-01 (×67): qty 0.5

## 2021-10-01 MED ORDER — HYDROMORPHONE HCL 1 MG/ML IJ SOLN
1.0000 mg | INTRAMUSCULAR | Status: DC | PRN
  Administered 2021-10-01: 1 mg via INTRAVENOUS
  Filled 2021-10-01: qty 1

## 2021-10-01 MED ORDER — LORAZEPAM 2 MG/ML IJ SOLN
1.0000 mg | INTRAMUSCULAR | Status: DC | PRN
  Administered 2021-10-01: 1 mg via INTRAVENOUS
  Filled 2021-10-01: qty 1

## 2021-10-01 MED ORDER — SODIUM CHLORIDE 0.9 % IV SOLN
510.0000 mg | Freq: Once | INTRAVENOUS | Status: AC
  Administered 2021-10-01: 510 mg via INTRAVENOUS
  Filled 2021-10-01: qty 17

## 2021-10-01 MED ORDER — HYDROMORPHONE HCL 1 MG/ML IJ SOLN: 2.0000 mg | INTRAMUSCULAR | Status: DC | PRN

## 2021-10-01 MED ORDER — DEXTROSE IN LACTATED RINGERS 5 % IV SOLN: INTRAVENOUS | Status: DC

## 2021-10-01 NOTE — H&P (Signed)
Chief Complaint: Patient was seen in consultation today for hepatic mass at the request of Burney Gauze, MD  Referring Physician(s): Burney Gauze, MD  Supervising Physician: Daryll Brod  Patient Status: [facility] - In-pt  History of Present Illness: Jane Kennedy is a 76 y.o. 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  Patient received phone call requesting her to go to ED from her oncologist stating her labs reflected hypercalcemia with Ca++ level of 14. Pt was found to have Ca++ of 12., K+ of 2.3 and AMS. She also c/o abdominal pain/cramping. Dr. Marin Olp has referred pt to IR for renal biopsy and L2 vertebroplasty. Dr. Dwaine Gale has approved pt for hepatic mass biopsy being the safer option. L2 kyphoplasty has been sent for insurance authorization. Biopsy tentatively scheduled for 10/02/21.   Past Medical History:  Diagnosis Date   Cancer ([place])    Hyperlipidemia    Vertigo    Vitamin D deficiency 06/09/2015    Past Surgical History:  Procedure Laterality Date   CARPAL TUNNEL RELEASE Left    REPLACEMENT TOTAL HIP W/  RESURFACING IMPLANTS Bilateral     Allergies: Codeine and Nickel  Medications: Prior to Admission medications   Medication Sig Start Date End Date Taking? Authorizing Provider  acetaminophen (TYLENOL) 325 MG tablet Take 325 mg by mouth once. pain   Yes [provider]  amitriptyline (ELAVIL) 100 MG tablet Take 100 mg by mouth daily.   Yes [provider]  atorvastatin (LIPITOR) 40 MG tablet Take 40 mg by mouth daily. 09/18/21  Yes [provider]  Calcium Carbonate Antacid (TUMS PO) Take 2 tablets by mouth daily as needed (acid reflux).   Yes [provider]  ibuprofen (ADVIL) 200 MG tablet Take 200-400 mg by mouth 2 (two) times daily as needed for moderate pain.   Yes [provider]  lidocaine (XYLOCAINE) 5 % ointment Apply 1 application. topically 2 (two) times daily as  needed (back pain).   Yes [provider]  omeprazole (PRILOSEC) 20 MG capsule Take 20 mg by mouth daily. 07/01/21  Yes [provider]  ondansetron (ZOFRAN) 8 MG tablet Take 1 tablet (8 mg total) by mouth every 8 (eight) hours as needed for nausea or vomiting. 09/20/21  Yes Ennever, Rudell Cobb, MD  cephALEXin (KEFLEX) 500 MG capsule Take 500 mg by mouth 2 (two) times daily. Patient not taking: Reported on 09/30/2021 09/29/21   [provider]  HYDROcodone-acetaminophen (NORCO/VICODIN) 5-325 MG tablet Take 1-2 tablets by mouth every 4 (four) hours as needed for moderate pain. Max 4-6 tabs a day Patient not taking: Reported on 09/30/2021 09/17/21   Volanda Napoleon, MD  Ibuprofen-diphenhydrAMINE HCl (IBUPROFEN PM) 200-25 MG CAPS Take 1 tablet by mouth at bedtime as needed (sleep). Patient not taking: Reported on 09/30/2021    [provider]  ondansetron (ZOFRAN-ODT) 4 MG disintegrating tablet Take by mouth. Patient not taking: Reported on 09/30/2021 09/29/21   [provider]  traMADol Veatrice Bourbon) 50 MG tablet  09/29/21   [provider]     Family History  Problem Relation Age of Onset   Migraines Mother    Multiple myeloma Mother    Diabetes Mellitus II Father    Stroke Maternal Grandfather    Colon cancer Maternal Aunt    Breast cancer Maternal Aunt     Social History   Socioeconomic History   Marital status: Widowed    Spouse name: Not on file  Number of children: Not on file   Years of education: Not on file   Highest education level: Not on file  Occupational History   Not on file  Tobacco Use   Smoking status: Former    Packs/day: 1.00    Types: Cigarettes    Quit date: 2018    Years since quitting: 5.4   Smokeless tobacco: Never  Vaping Use   Vaping Use: Never used  Substance and Sexual Activity   Alcohol use: Never   Drug use: Never   Sexual activity: Not on file  Other Topics Concern   Not on file  Social History Narrative    Not on file   Social Determinants of Health   Financial Resource Strain: Not on file  Food Insecurity: Not on file  Transportation Needs: Not on file  Physical Activity: Not on file  Stress: Not on file  Social Connections: Not on file    Review of Systems: A 12 point ROS discussed and pertinent positives are indicated in the HPI above.  All other systems are negative.  Review of Systems  Unable to perform ROS: Acuity of condition   Vital Signs: BP (!) 129/109 (BP Location: Left Arm)   Pulse (!) 113   Temp 99.4 F (37.4 C) (Rectal)   Resp 16   Ht '5\' 3"'  (1.6 m)   Wt 130 lb (59 kg)   SpO2 96%   BMI 23.03 kg/m   Physical Exam Vitals reviewed.  Constitutional:      General: She is not in acute distress.    Appearance: She is ill-appearing.  Cardiovascular:     Rate and Rhythm: Regular rhythm. Tachycardia present.     Pulses: Normal pulses.     Heart sounds: Normal heart sounds.  Pulmonary:     Effort: Pulmonary effort is normal. No respiratory distress.     Breath sounds: Normal breath sounds.  Musculoskeletal:     Right lower leg: No edema.     Left lower leg: No edema.  Skin:    General: Skin is warm and dry.  Neurological:     Mental Status: She is disoriented.    Imaging: DG Chest 2 View  Result Date: 09/10/2021 CLINICAL DATA:  Abdominal pain, decreased appetite EXAM: CHEST - 2 VIEW COMPARISON:  None Available. FINDINGS: The heart size and mediastinal contours are within normal limits. Both lungs are clear. The visualized skeletal structures are unremarkable. IMPRESSION: No active cardiopulmonary disease. Electronically Signed   By: Elmer Picker M.D.   On: 09/10/2021 19:27   CT Chest W Contrast  Result Date: 09/22/2021 CLINICAL DATA:  Recent diagnosis of renal cell carcinoma. Evaluate for thoracic metastatic disease. * Tracking Code: BO * EXAM: CT CHEST WITH CONTRAST TECHNIQUE: Multidetector CT imaging of the chest was performed during intravenous  contrast administration. RADIATION DOSE REDUCTION: This exam was performed according to the departmental dose-optimization program which includes automated exposure control, adjustment of the mA and/or kV according to patient size and/or use of iterative reconstruction technique. CONTRAST:  64m OMNIPAQUE IOHEXOL 300 MG/ML  SOLN COMPARISON:  Abdominopelvic CT 09/10/2021 FINDINGS: Cardiovascular: No acute vascular findings. Atherosclerosis of the aorta, great vessels and coronary arteries. The heart size is normal. There is no pericardial effusion. Mediastinum/Nodes: There are no enlarged mediastinal, hilar or axillary lymph nodes.Indeterminate low-density right thyroid nodule measuring 2.4 x 2.1 cm on image 17/2. The esophagus and trachea appear unremarkable. Lungs/Pleura: No pleural effusion or pneumothorax. There is an endobronchial lesion within the  left lower lobe which measures up to 1.4 cm on image 87/5. Upstream from this, the corresponding bronchus is impacted. There are scattered additional small pulmonary nodules, largest measuring 6 mm in the left lower lobe on image 64/5. In the right lower lobe, a nodular density measuring approximately 7 mm on image 120/5 is intimately associated with an adjacent vessel, and is probably a small varix. Upper abdomen: As seen on recent abdominal CT, there is a large heterogeneous mass involving the upper pole of the left kidney consistent with renal cell carcinoma. This mass demonstrates local extension into the retroperitoneal fat, and there is tumor thrombus in the left renal vein which may extend into the IVC. Extensive metastatic disease to the liver is again noted. Musculoskeletal/Chest wall: No definite osseous metastases are identified within the chest. Lytic lesion within the L2 vertebral body again noted, consistent with a metastasis. IMPRESSION: 1. No findings definitive for metastatic disease within the chest. 2. There are small indeterminate pulmonary nodules  bilaterally, and there is impaction of a left lower lobe bronchus by a potential endobronchial mass. Recommend attention on follow-up. 3. 2.4 cm right thyroid nodule. Recommend thyroid US.(Ref: J Am Coll Radiol. 2015 Feb;12(2): 143-50). 4. Again demonstrated findings of locally advanced left renal cell carcinoma with metastatic disease to the liver and L2 vertebral body. Suspected extension of tumor thrombus into the IVC. Electronically Signed   By: Richardean Sale M.D.   On: 09/22/2021 11:54   MR Brain W Wo Contrast  Result Date: 09/24/2021 CLINICAL DATA:  Renal mass, right N28.89 (ICD-10-CM). Staging for metastatic kidney cancer. EXAM: MRI HEAD WITHOUT AND WITH CONTRAST TECHNIQUE: Multiplanar, multiecho pulse sequences of the brain and surrounding structures were obtained without and with intravenous contrast. CONTRAST:  8m GADAVIST GADOBUTROL 1 MMOL/ML IV SOLN COMPARISON:  None Available. FINDINGS: Brain: No acute infarction, hemorrhage, hydrocephalus, extra-axial collection or intracranial mass lesion. Susceptibility artifact in the right precentral sulcus suggesting hemosiderin deposit. Scattered foci of T2 hyperintensity are seen within the white matter of the cerebral hemispheres, nonspecific, most likely related to chronic small vessel ischemia. Mild parenchymal volume loss. Vascular: Normal flow voids. Skull and upper cervical spine: Normal marrow signal. Sinuses/Orbits: Negative. Other: Lobulated enhancing mass lesion within the right temporalis muscle measuring approximately 2.7 x 1.2 x 1.2 cm. On the susceptibility weighted images, 2 foci of susceptibility artifact are seen within this lesion, suggesting phleboliths. A vascular channel appear to connect the lesion through the greater wing of the right sphenoid two diploic veins. The lesion has facilitated diffusion. IMPRESSION: 1. No evidence of intracranial metastatic disease. 2. Mild chronic microvascular ischemic changes of the white matter. 3.  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 A head CT could confirm the presence of phleboliths. Electronically Signed   By: KPedro EarlsM.D.   On: 09/24/2021 17:14   MR Lumbar Spine W Wo Contrast  Result Date: 09/24/2021 CLINICAL DATA:  Renal mass, right N28.89 (ICD-10-CM). Hematologic malignancy, staging; She has L2 disease on CT scan. History of likely kidney cancer EXAM: MRI LUMBAR SPINE WITHOUT AND WITH CONTRAST TECHNIQUE: Multiplanar and multiecho pulse sequences of the lumbar spine were obtained without and with intravenous contrast. CONTRAST:  679mGADAVIST GADOBUTROL 1 MMOL/ML IV SOLN COMPARISON:  CT of the abdomen Sep 10, 2021; CT of the chest May 26. FINDINGS: Segmentation:  Standard. Alignment:  Trace anterolisthesis at L3-4. Vertebrae: Pathologic compression fracture of the L2 vertebral body resulting in  approximately 55% vertebral body height loss which is most significant on the left side fall. The tumor extends into the bilateral pedicles with expansion of the left pedicle as well as retropulsion of the posterior wall into the spinal canal on the left side with mass effect on the thecal sac without high-grade spinal canal stenosis. No definitive evidence of other metastatic lesion to the lumbar spine. Chronic endplate degenerative changes at L4-5 and L5-S1. No evidence of discitis. Conus medullaris and cauda equina: Conus extends to the L2 level. Conus and cauda equina appear normal. Paraspinal and other soft tissues: Musculature appear preserved. Large irregularly enhancing mass lesion in the upper pole of the left kidney, consistent with known renal cell carcinoma with multiple small satellite perinephric lesions. Associated thrombus within the left renal vein extending into the inferior vena cava left gonadal vein. A a 6.2 cm right renal cyst. Cholelithiasis. Hepatic heterogeneity related to known metastatic disease.  Disc levels: T12-L1: No spinal canal or neural foraminal stenosis. L1-2: Disc bulge and facet degenerative changes as well as cortical expansion of the left L2 pedicle resulting in mild left neural foraminal narrowing. No spinal canal stenosis. L2-3: Retropulsion of the L2 posterior wall on the left and cortical expansion of the L2 pedicle resulting in overall mild spinal canal stenosis which is moderate on the left side. Superimposed disc bulge and facet degenerative changes also contribute for moderate bilateral neural foraminal narrowing. L3-4: Disc bulge and hypertrophic facet degenerative changes ligamentum flavum redundancy resulting in moderate spinal canal stenosis and moderate bilateral neural foraminal narrowing. L4-5: Prominent loss of disc height, disc bulge and hypertrophic facet degenerative changes with ligamentum flavum redundancy resulting in moderate to severe spinal canal stenosis with severe narrowing of the bilateral subarticular zones and moderate bilateral neural foraminal narrowing, left greater than right. L5-S1: Prominent loss of disc height, disc bulge with associated osteophytic component and moderate facet hypertrophy with ligamentum flavum redundancy resulting in mild spinal canal stenosis with severe right and moderate left subarticular zone stenosis, moderate to severe bilateral neural foraminal narrowing. IMPRESSION: 1. Pathologic fracture of the L2 vertebral body with approximately 55% vertebral body height loss. Extension into the pedicles with retropulsion of the posterior wall and expansion of the left pedicle causing mass effect on the thecal sac and resulting in overall mild spinal canal stenosis. The lesion also contribute for moderate left neural foraminal narrowing at L1-2 and L2-3. 2. Advanced degenerative changes of the lumbar spine with moderate spinal canal stenosis at L3-4 and moderate to severe spinal canal stenosis at L4-5. 3. Multilevel high-grade neural foraminal  narrowing, as described above. 4. Findings consistent with left renal cell carcinoma with tumoral thrombus extending into the inferior vena cava and left gonadal vein, as seen on prior CT. Electronically Signed   By: Pedro Earls M.D.   On: 09/24/2021 16:46   CT Abdomen Pelvis W Contrast  Result Date: 09/10/2021 CLINICAL DATA:  Severe abdominal pain and nausea. * Tracking Code: BO * EXAM: CT ABDOMEN AND PELVIS WITH CONTRAST TECHNIQUE: Multidetector CT imaging of the abdomen and pelvis was performed using the standard protocol following bolus administration of intravenous contrast. RADIATION DOSE REDUCTION: This exam was performed according to the departmental dose-optimization program which includes automated exposure control, adjustment of the mA and/or kV according to patient size and/or use of iterative reconstruction technique. CONTRAST:  135m OMNIPAQUE IOHEXOL 300 MG/ML  SOLN COMPARISON:  None Available. FINDINGS: Lower Chest: No acute findings. Hepatobiliary: Multiple hypovascular masses with  peripheral rim enhancement are seen throughout the liver, consistent with diffuse liver metastases. One index inferior right hepatic lobe measures 4.0 x 2.7 cm on image 37/3. Another index lesion in the left hepatic lobe measures 2.6 x 2.2 cm on image 25/3. Small calcified gallstone is noted, however there is no evidence of cholecystitis or biliary ductal dilatation. Pancreas:  No mass or inflammatory changes. Spleen: Within normal limits in size and appearance. Adrenals/Urinary Tract: 5.7 x 5.8 cm heterogeneously enhancing mass in upper pole of left kidney, consistent with renal cell carcinoma. Soft tissue nodules are seen within a left posterior perinephric space, largest measuring 2.0 x 1.4 cm. Tumor thrombus is also seen extending into the left renal vein which extends to the midline. Mild nonenhancing thrombus seen in the distal left gonadal vein. No evidence of IVC tumor thrombus. 6.6 cm  benign-appearing right renal cyst also noted. Stomach/Bowel: No evidence of obstruction, inflammatory process or abnormal fluid collections. Diverticulosis is seen mainly involving the sigmoid colon, however there is no evidence of diverticulitis. Vascular/Lymphatic: No pathologically enlarged lymph nodes. No acute vascular findings. Aortic atherosclerotic calcification incidentally noted. Reproductive: Image resolution is limited by artifact from bilateral hip prostheses. Several small calcified uterine fibroids are noted. Central low attenuation is also seen within the endometrial cavity may represent diffuse endometrial thickening. Other:  None. Musculoskeletal: Lytic bone lesion involving L2 vertebral body and pedicle, consistent with bone metastasis IMPRESSION: 5.8 cm left upper pole renal mass, consistent with renal cell carcinoma. Adjacent soft tissue nodules within a left posterior perinephric space. Tumor thrombus in the left renal vein, with mild bland thrombus in distal left gonadal vein. 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 Cholelithiasis. No radiographic evidence of cholecystitis. Colonic diverticulosis, without radiographic evidence of diverticulitis. Small calcified uterine fibroids. Possible diffuse endometrial thickening although visualization limited by artifact from hip prostheses. Endometrial carcinoma cannot be excluded in a postmenopausal patient. Recommend transvaginal pelvic ultrasound for further evaluation. Aortic Atherosclerosis (ICD10-I70.0). Electronically Signed   By: Marlaine Hind M.D.   On: 09/10/2021 20:03   US PELVIC COMPLETE W TRANSVAGINAL AND TORSION R/O  Result Date: 09/10/2021 CLINICAL DATA:  Abnormal uterus on recent CT EXAM: TRANSABDOMINAL AND TRANSVAGINAL ULTRASOUND OF PELVIS DOPPLER ULTRASOUND OF OVARIES TECHNIQUE: Both transabdominal and transvaginal ultrasound examinations of the pelvis were performed. Transabdominal technique was  performed for global imaging of the pelvis including uterus, ovaries, adnexal regions, and pelvic cul-de-sac. It was necessary to proceed with endovaginal exam following the transabdominal exam to visualize the ovaries. Color and duplex Doppler ultrasound was utilized to evaluate blood flow to the ovaries. COMPARISON:  None Available. FINDINGS: Uterus Measurements: 5.9 x 2.9 x 5.0 cm. = volume: 45 mL. Diffusely heterogeneous with calcified lesions consistent with small fibroids. Largest of these measures 2.5 cm in the fundus. Endometrium Thickness: 4 mm. No discrete abnormality to correspond with the hypo dense region seen on recent CT. Right ovary Not visualized Left ovary Not visualized Pulsed Doppler evaluation of both ovaries demonstrates normal low-resistance arterial and venous waveforms. Other findings No abnormal free fluid. IMPRESSION: Uterine fibroid change. No endometrial abnormality is identified to correspond with that seen on recent CT examination . Electronically Signed   By: Inez Catalina M.D.   On: 09/10/2021 22:05    Labs:  CBC: Recent Labs    09/10/21 1720 09/16/21 1129 09/30/21 1055 10/01/21 0559  WBC 10.9* 7.7 12.3* 11.1*  HGB 10.8* 9.4* 9.4* 9.1*  HCT 34.6* 31.3* 31.8* 30.7*  PLT  400 306 354 338    COAGS: Recent Labs    10/01/21 1058  INR 1.0    BMP: Recent Labs    09/10/21 1720 09/16/21 1129 09/30/21 1055 10/01/21 0559  NA 135 139 139 139  K 3.7 3.6 2.3* 2.8*  CL 97* 102 96* 101  CO2 28 33* 35* 30  GLUCOSE 132* 128* 109* 98  BUN '18 14 19 13  ' CALCIUM 11.6* 11.5* 12.1* 10.4*  CREATININE 0.81 0.73 0.94 0.75  GFRNONAA >60 >60 >60 >60    LIVER FUNCTION TESTS: Recent Labs    09/10/21 1720 09/16/21 1129 09/30/21 1055 10/01/21 0559  BILITOT 0.6 0.3 0.6 0.6  AST 31 24 39 40  ALT '22 19 17 17  ' ALKPHOS 205* 216* 225* 204*  PROT 7.6 6.5 6.5 6.2*  ALBUMIN 3.3* 3.4* 2.6* 2.5*    TUMOR MARKERS: No results for input(s): AFPTM, CEA, CA199, CHROMGRNA in  the last 8760 hours.  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  Patient received phone call requesting her to go to ED from her oncologist stating her labs reflected hypercalcemia with Ca++ level of 14. Pt was found to have Ca++ of 12., K+ of 2.3 and AMS. She also c/o abdominal pain/cramping. Dr. Marin Olp has referred pt to IR for renal biopsy and L2 vertebroplasty. Dr. Dwaine Gale has approved pt for hepatic mass biopsy being the safer option. L2 kyphoplasty has been sent for insurance authorization. Biopsy tentatively scheduled for 10/02/21.   Spoke with daughter at bedside while pt in CT scan.  Daughter states pt has not had ASA in over 1 month.  Daughter is aware that pt will need to be NPO after MN for procedure tomorrow.  Pt was seen in CT. Pt was given medication prior to scan and is lethargic. She responds to verbal stimuli but immediately falls back to sleep.  Pt unable to answer questions, follow commands d/t sedation. She is in no distress.   Risks and benefits of hepatic mass biopsy with moderate sedation was discussed with the patient and/or patient's family including, but not limited to bleeding, infection, damage to adjacent structures or low yield requiring additional tests.  All of the daughter's questions were answered and there is agreement to proceed.  Consent signed and in chart.   Thank you for this interesting consult.  I greatly enjoyed meeting Jane Kennedy and look forward to participating in their care.  A copy of this report was sent to the requesting provider on this date.  Electronically Signed: Tyson Alias, NP 10/01/2021, 12:40 PM   I spent a total of 20 minutes in face to face in clinical consultation, greater than 50% of which was counseling/coordinating care for hepatic mass.

## 2021-10-01 NOTE — Progress Notes (Signed)
Ms. Trevathan is agitated this morning.  She would like to have water.  She might be a little bit disoriented.  Her calcium is down to 10.4.  Her albumin is 2.5.  The potassium is pending.   She is scheduled for I think a biopsy today.  We will try a little Ativan to see this may help some of this agitation.  Hopefully, Interventional Radiology will be able to see her today and see about the possibility of doing vertebroplasty.  Her CBC shows white count 11.1.  Hemoglobin 9.1.  Platelet count 338,000.  She is iron deficient.  Her iron saturation back a couple weeks ago was 10%.  I will give her some IV iron.  I will hold off on a blood transfusion right now.  She has had no obvious bleeding.  There is no nausea or vomiting.  Again she has this slight disorientation.  She is agitated.  She wants to have some to drink.  We tried to tell her that she cannot really have anything until she has this biopsy.  Her vital signs are stable.  Temperature 98.9.  Pulse 110.  Blood pressure 159/73.  Her lungs are clear.  Cardiac exam regular rate and rhythm.  Abdomen is soft.  Bowel sounds are present.  There is no guarding or rebound tenderness.  Extremity shows no clubbing, cyanosis or edema.  Neurological exam shows no focal deficits.  Again, she is somewhat agitated.  I suspect this does have metastatic renal cell carcinoma.  Her calcium has come down nicely.  Hopefully, her cognition will also improve.  It will be very interesting to see what Interventional Radiology has to say about the possibility of a vertebroplasty.  Lattie Haw, MD  Darlyn Chamber 17:14

## 2021-10-01 NOTE — Progress Notes (Signed)
PROGRESS NOTE    Jane Kennedy  HTD:[id number] DOB: [birth year] DOA: 09/30/2021 PCP: Houston Siren., MD   Brief Narrative: 76 year old with past medical history significant for actinic keratosis, benign neoplasm of large intestine, carpal tunnel syndrome, DDD DDD lumbar sacral, diverticulosis, GERD, hyperlipidemia, IBS, osteopenia, vertigo, vitamin D deficiency presents with nausea vomiting, abdominal pain and elevated calcium level.  Evaluated outside facility was diagnosed with UTI, antibiotics prescribed.  She was not able to fill out prescription.  She was found to have potassium of 2.3, magnesium 1.7, phosphorus 2.6, calcium 12.1.  Patient admitted for further treatment of hypercalcemia.  Patient is more delirious, agitated at times, tremors, lethargic today. CT head, ABG ordered.   Assessment & Plan:   Principal Problem:   Hypercalcemia Active Problems:   Gastroesophageal reflux disease with esophagitis   Hyperlipidemia, unspecified   Hypokalemia   Left kidney mass   Moderate protein malnutrition (HCC)   1-Hypercalcemia: Likely related to hypercalcemia of malignancy in the setting of renal mass: Continue with IV fluids.  Received Zometa and Calcitonin  Calcium down to 10.4 corrected by albumin at 11. Presents with calcium at 12.--corrected by albumin at 2.  Acute metabolic Encephalopathy:  She is more delirious today, AMS, agitated and subsequently lethargic.  ABG negative for hypercapnia, and or hypoxia.  CT head; no acute abnormalities.  Check Ammonia level.  Suspect related to delirium from acute illness, also medications: opioids, compazine.    Hypokalemia: Replete IV with 5 runs.  Repeat labs tonight.   Left Kidney Mass; diffuse liver mets, lytic bone metastasis involving L2 vertebra Appreciate Dr Marin Olp assistance.  IR consulted for liver Bx, and Kyphoplastic.   Moderate protein malnutrition: IV fluids.  Start ensure.   GERD: IV Protonix.    Hyperlipidemia: Hold statins.   Pathological fracture L2 vertebral body: IR consulted for Kyphoplasthy   Anemia; iron deficiency; IV iron.    Estimated body mass index is 23.03 kg/m as calculated from the following:   Height as of this encounter: '5\' 3"'$  (1.6 m).   Weight as of this encounter: 59 kg.   DVT prophylaxis: SCD Code Status: Full code Family Communication: Care discussed with daughter  Disposition Plan:  Status is: inpatient.    Consultants:  Dr Marin Olp.  IR  Procedures:  None  Antimicrobials:    Subjective: She is lethargic, she just received ativan. She has received dilaudid. She has been agitated, was having some tremors as well per nurse staff  Objective: Vitals:   09/30/21 2340 09/30/21 2341 10/01/21 0325 10/01/21 0516  BP:   (!) 156/68 (!) 159/73  Pulse:   (!) 107 (!) 110  Resp:   18 17  Temp:   99.2 F (37.3 C) 98.9 F (37.2 C)  TempSrc:   Oral Oral  SpO2: (!) 85% 96% 100% 93%  Weight:      Height:        Intake/Output Summary (Last 24 hours) at 10/01/2021 0658 Last data filed at 10/01/2021 0600 Gross per 24 hour  Intake 1211.4 ml  Output 550 ml  Net 661.4 ml   Filed Weights   09/30/21 1026  Weight: 59 kg    Examination:  General exam: Appears calm and comfortable  Respiratory system: Clear to auscultation. Respiratory effort normal. Cardiovascular system: S1 & S2 heard, RRR.  Gastrointestinal system: Abdomen is nondistended, soft and nontender. No organomegaly or masses felt. Normal bowel sounds heard. Central nervous system: lethargic, open eyes to voice, restless.  Extremities: Symmetric 5 x  5 power.   Data Reviewed: I have personally reviewed following labs and imaging studies  CBC: Recent Labs  Lab 09/30/21 1055 10/01/21 0559  WBC 12.3* 11.1*  NEUTROABS  --  8.5*  HGB 9.4* 9.1*  HCT 31.8* 30.7*  MCV 79.5* 80.6  PLT 354 623   Basic Metabolic Panel: Recent Labs  Lab 09/30/21 1055  NA 139  K 2.3*  CL 96*   CO2 35*  GLUCOSE 109*  BUN 19  CREATININE 0.94  CALCIUM 12.1*  MG 1.7  PHOS 2.6   GFR: Estimated Creatinine Clearance: 42.1 mL/min (by C-G formula based on SCr of 0.94 mg/dL). Liver Function Tests: Recent Labs  Lab 09/30/21 1055  AST 39  ALT 17  ALKPHOS 225*  BILITOT 0.6  PROT 6.5  ALBUMIN 2.6*   Recent Labs  Lab 09/30/21 1055  LIPASE 22   No results for input(s): AMMONIA in the last 168 hours. Coagulation Profile: No results for input(s): INR, PROTIME in the last 168 hours. Cardiac Enzymes: No results for input(s): CKTOTAL, CKMB, CKMBINDEX, TROPONINI in the last 168 hours. BNP (last 3 results) No results for input(s): PROBNP in the last 8760 hours. HbA1C: No results for input(s): HGBA1C in the last 72 hours. CBG: No results for input(s): GLUCAP in the last 168 hours. Lipid Profile: No results for input(s): CHOL, HDL, LDLCALC, TRIG, CHOLHDL, LDLDIRECT in the last 72 hours. Thyroid Function Tests: No results for input(s): TSH, T4TOTAL, FREET4, T3FREE, THYROIDAB in the last 72 hours. Anemia Panel: No results for input(s): VITAMINB12, FOLATE, FERRITIN, TIBC, IRON, RETICCTPCT in the last 72 hours. Sepsis Labs: No results for input(s): PROCALCITON, LATICACIDVEN in the last 168 hours.  No results found for this or any previous visit (from the past 240 hour(s)).       Radiology Studies: No results found.      Scheduled Meds:  atorvastatin  40 mg Oral Daily   calcitonin  4 Units/kg Intramuscular BID   pantoprazole (PROTONIX) IV  40 mg Intravenous Q24H   Continuous Infusions:  sodium chloride 100 mL/hr at 09/30/21 2141     LOS: 0 days    Time spent: 35 minutes.     Elmarie Shiley, MD [facility]   If 7PM-7AM, please contact night-coverage [email or web address]  10/01/2021, 6:58 AM

## 2021-10-01 NOTE — Progress Notes (Signed)
Patient was admitted for hypercalcemia and change in mental status. While inpatient, Dr Marin Olp will attempt to complete biopsy and possible vertebroplasty. Will follow for discharge needs and hospital follow up.   Oncology Nurse Navigator Documentation     10/01/2021    7:45 AM  Oncology Nurse Navigator Flowsheets  Navigator Follow Up Date: 10/04/2021  Navigator Follow Up Reason: Appointment Review  Navigator Location [facility]  Navigator Encounter Type Appt/Treatment Plan Review  Patient Visit Type MedOnc  Treatment Phase Abnormal Scans  Barriers/Navigation Needs Coordination of Care;Education;Family Concerns  Interventions None Required  Acuity Level 2-Minimal Needs (1-2 Barriers Identified)  Support Groups/Services Friends and Family  Time Spent with Patient 15

## 2021-10-02 ENCOUNTER — Encounter (HOSPITAL_COMMUNITY): Payer: Self-pay | Admitting: Internal Medicine

## 2021-10-02 ENCOUNTER — Inpatient Hospital Stay (HOSPITAL_COMMUNITY): Payer: Medicare HMO

## 2021-10-02 LAB — BASIC METABOLIC PANEL
Anion gap: 6 (ref 5–15)
BUN: 10 mg/dL (ref 8–23)
CO2: 29 mmol/L (ref 22–32)
Calcium: 9.3 mg/dL (ref 8.9–10.3)
Chloride: 106 mmol/L (ref 98–111)
Creatinine, Ser: 0.7 mg/dL (ref 0.44–1.00)
GFR, Estimated: >60 mL/min (ref >60)
Glucose, Bld: 131 mg/dL — ABNORMAL HIGH (ref 70–99)
Potassium: 3.1 mmol/L — ABNORMAL LOW (ref 3.5–5.1)
Sodium: 141 mmol/L (ref 135–145)

## 2021-10-02 LAB — CBC WITH DIFFERENTIAL/PLATELET
Abs Immature Granulocytes: 0.04 10*3/uL (ref 0.00–0.07)
Basophils Absolute: 0 10*3/uL (ref 0.0–0.1)
Basophils Relative: 0 %
Eosinophils Absolute: 0.1 10*3/uL (ref 0.0–0.5)
Eosinophils Relative: 1 %
HCT: 27.3 % — ABNORMAL LOW (ref 36.0–46.0)
Hemoglobin: 8 g/dL — ABNORMAL LOW (ref 12.0–15.0)
Immature Granulocytes: 0 %
Lymphocytes Relative: 8 %
Lymphs Abs: 0.7 10*3/uL (ref 0.7–4.0)
MCH: 23.5 pg — ABNORMAL LOW (ref 26.0–34.0)
MCHC: 29.3 g/dL — ABNORMAL LOW (ref 30.0–36.0)
MCV: 80.1 fL (ref 80.0–100.0)
Monocytes Absolute: 0.8 10*3/uL (ref 0.1–1.0)
Monocytes Relative: 9 %
Neutro Abs: 7.5 10*3/uL (ref 1.7–7.7)
Neutrophils Relative %: 82 %
Platelets: 283 10*3/uL (ref 150–400)
RBC: 3.41 MIL/uL — ABNORMAL LOW (ref 3.87–5.11)
RDW: 14.7 % (ref 11.5–15.5)
WBC: 9.2 10*3/uL (ref 4.0–10.5)
nRBC: 0 % (ref 0.0–0.2)

## 2021-10-02 LAB — COMPREHENSIVE METABOLIC PANEL
ALT: 17 U/L (ref 0–44)
AST: 42 U/L — ABNORMAL HIGH (ref 15–41)
Albumin: 2.3 g/dL — ABNORMAL LOW (ref 3.5–5.0)
Alkaline Phosphatase: 172 U/L — ABNORMAL HIGH (ref 38–126)
Anion gap: 6 (ref 5–15)
BUN: 12 mg/dL (ref 8–23)
CO2: 30 mmol/L (ref 22–32)
Calcium: 9.7 mg/dL (ref 8.9–10.3)
Chloride: 106 mmol/L (ref 98–111)
Creatinine, Ser: 0.69 mg/dL (ref 0.44–1.00)
GFR, Estimated: >60 mL/min (ref >60)
Glucose, Bld: 133 mg/dL — ABNORMAL HIGH (ref 70–99)
Potassium: 2.8 mmol/L — ABNORMAL LOW (ref 3.5–5.1)
Sodium: 142 mmol/L (ref 135–145)
Total Bilirubin: 0.7 mg/dL (ref 0.3–1.2)
Total Protein: 5.8 g/dL — ABNORMAL LOW (ref 6.5–8.1)

## 2021-10-02 LAB — PREPARE RBC (CROSSMATCH)

## 2021-10-02 LAB — AMMONIA: Ammonia: 49 umol/L — ABNORMAL HIGH (ref 9–35)

## 2021-10-02 LAB — MAGNESIUM: Magnesium: 2.2 mg/dL (ref 1.7–2.4)

## 2021-10-02 IMAGING — US US BIOPSY CORE LIVER
1 series · 13 of 25 positions shown · non-contrast
Comparison: none

INDICATION: Imaging findings consistent with metastatic renal cell carcinoma.
Liver metastases.

[Series 1: us biopsy (liver) · 36 acquisitions, 13 frames shown]
[im 1/36]
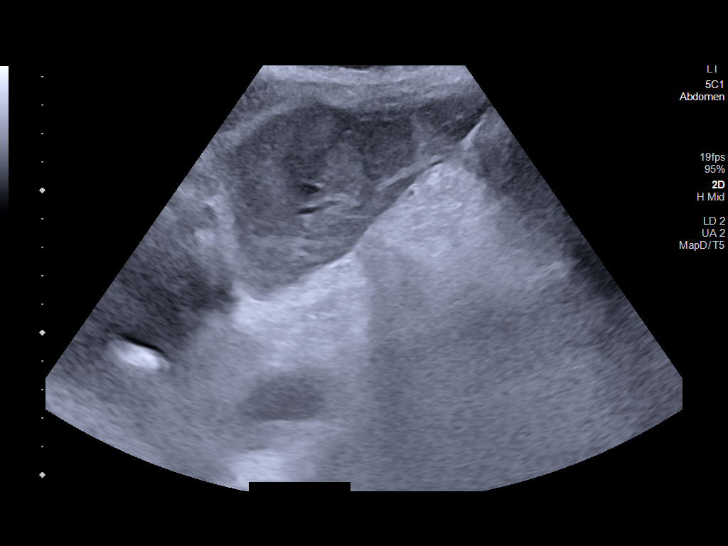
[im 3/36]
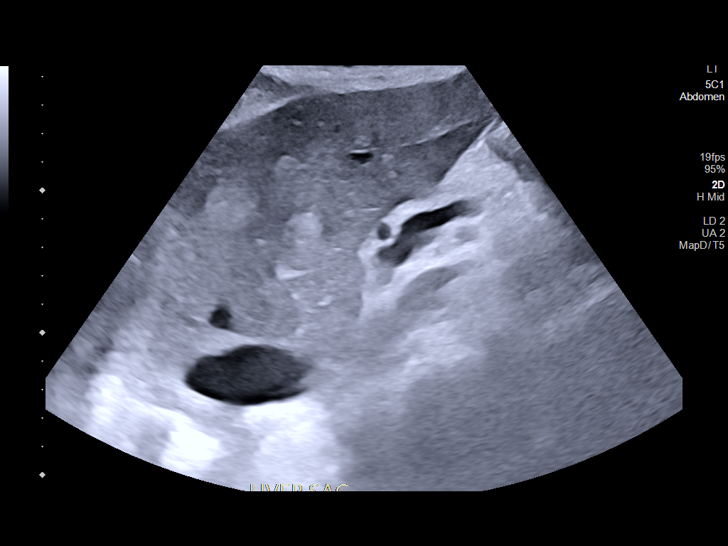
[im 6/36]
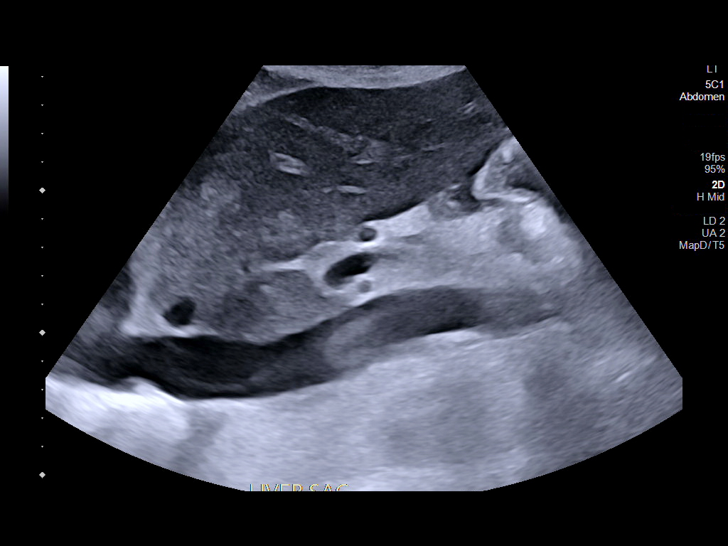
[im 9/36]
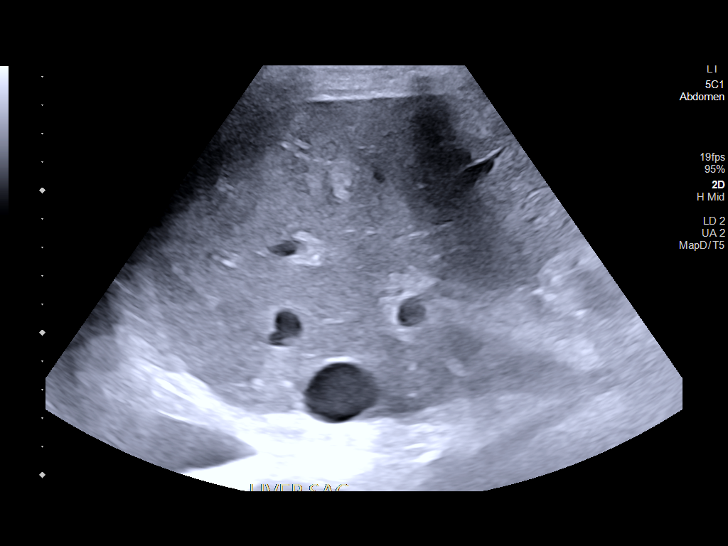
[im 12/36]
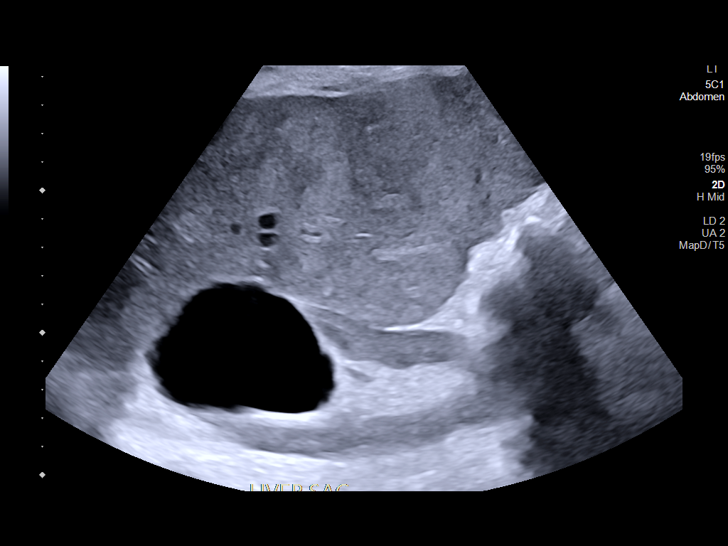
[im 15/36]
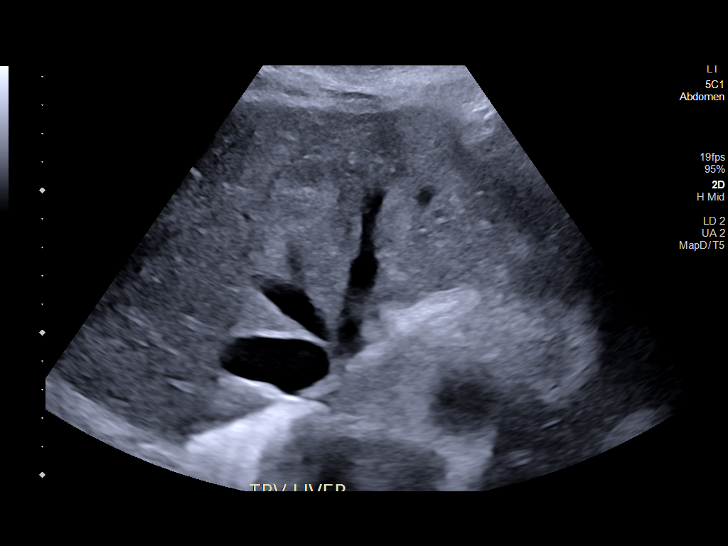
[im 18/36]
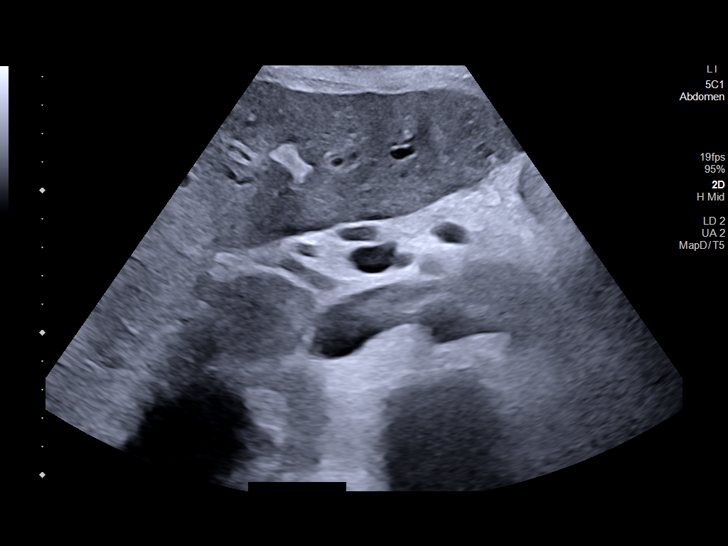
[im 21/36]
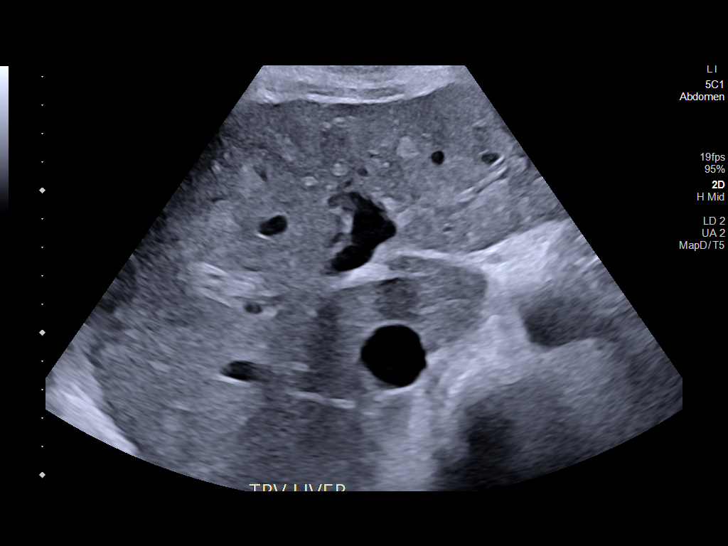
[im 24/36]
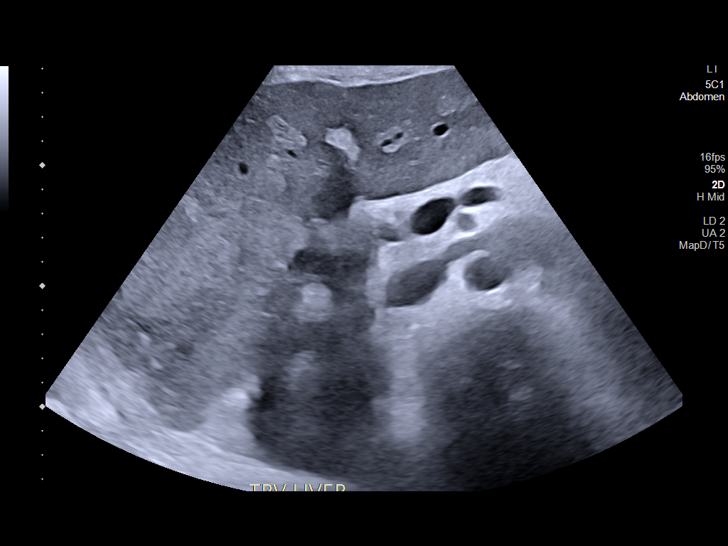
[im 27/36]
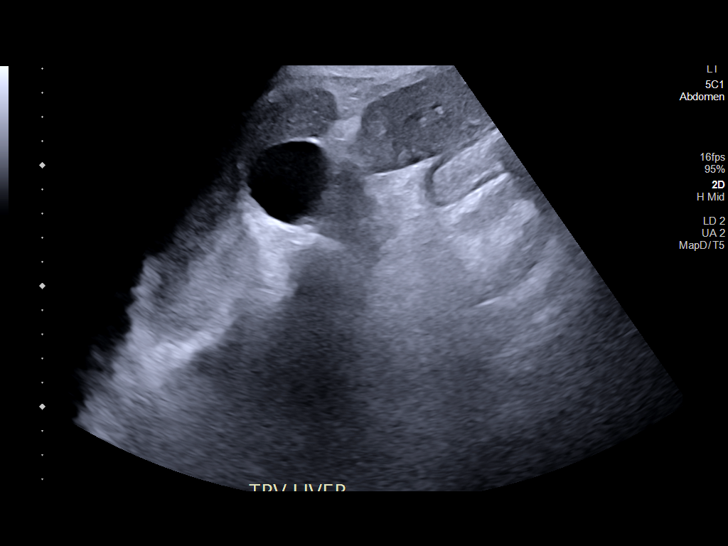
[im 30/36]
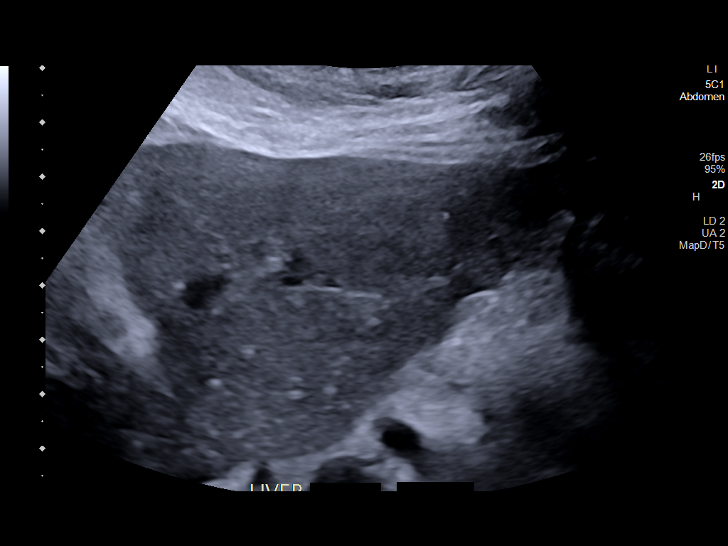
[im 33/36]
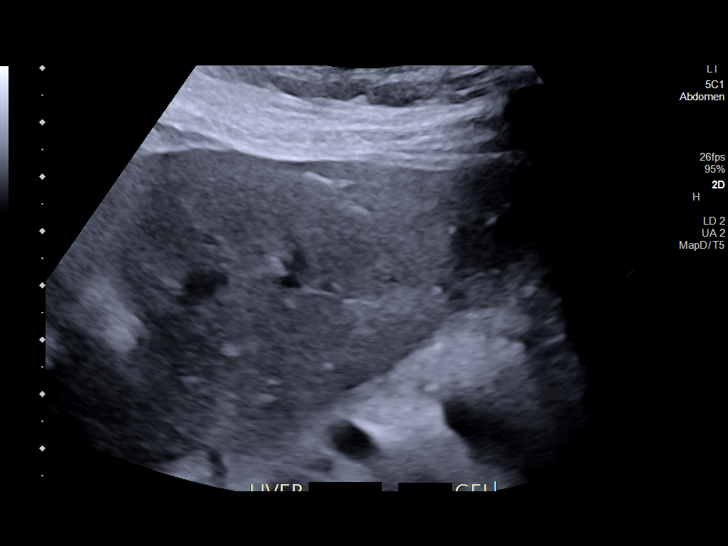
[im 36/36]
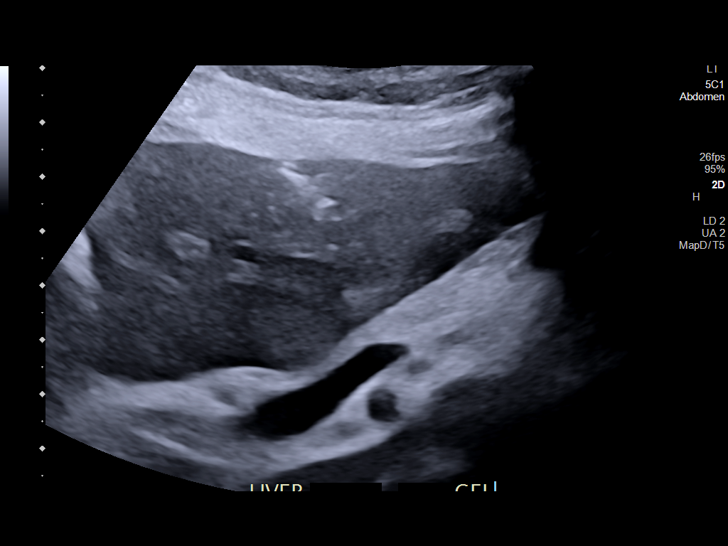

[13 of 25 positions shown; findings below may reference images not displayed]

EXAM:
ULTRASOUND LEFT LIVER METASTASIS 18 GAUGE CORE BIOPSY

MEDICATIONS:
1% LIDOCAINE LOCAL

ANESTHESIA/SEDATION:
Moderate (conscious) sedation was employed during this procedure. A
total of Versed 1.0 mg and Fentanyl 25 mcg was administered
intravenously by the radiology nurse.

Total intra-service moderate Sedation Time: 11 minutes. The
patient's level of consciousness and vital signs were monitored
continuously by radiology nursing throughout the procedure under my
direct supervision.

COMPLICATIONS:
None immediate.

PROCEDURE:
Informed written consent was obtained from the patient after a
thorough discussion of the procedural risks, benefits and
alternatives. All questions were addressed. Maximal Sterile Barrier
Technique was utilized including caps, mask, sterile gowns, sterile
gloves, sterile drape, hand hygiene and skin antiseptic. A timeout
was performed prior to the initiation of the procedure.

previous imaging reviewed. preliminary ultrasound performed. solid
hyperechoic liver lesions demonstrated correlating with the CT. a
left hepatic lesion was localized and marked in the subxiphoid area
for biopsy.

Under sterile conditions and local anesthesia, ultrasound guidance
utilized to advance a 17 gauge coaxial guide needle into a left
hepatic lesion. Needle position confirmed with ultrasound. Through
the access, 18 gauge core biopsies obtained. Samples were intact and
non fragmented. Images obtained for documentation. These were placed
in formalin. Needle tract occluded with Gel-Foam. Postprocedure
imaging demonstrates no hemorrhage or hematoma. Patient tolerated
the biopsy well.

Of note, left renal vein and IVC thrombus noted correlating with the
tumor thrombus by CT.
IMPRESSION: Successful ultrasound left hepatic metastasis 18 gauge core biopsy.

## 2021-10-02 MED ORDER — RIFAXIMIN 550 MG PO TABS
550.0000 mg | ORAL_TABLET | Freq: Two times a day (BID) | ORAL | Status: DC
  Administered 2021-10-02 – 2021-10-04 (×5): 550 mg via ORAL
  Filled 2021-10-02 (×7): qty 1

## 2021-10-02 MED ORDER — LIDOCAINE HCL 1 % IJ SOLN
INTRAMUSCULAR | Status: AC
  Filled 2021-10-02: qty 20

## 2021-10-02 MED ORDER — LIDOCAINE HCL 1 % IJ SOLN
INTRAMUSCULAR | Status: AC | PRN
  Administered 2021-10-02: 10 mL via INTRADERMAL

## 2021-10-02 MED ORDER — MIDAZOLAM HCL 2 MG/2ML IJ SOLN
INTRAMUSCULAR | Status: AC | PRN
  Administered 2021-10-02: .5 mg via INTRAVENOUS

## 2021-10-02 MED ORDER — SODIUM CHLORIDE 0.9% IV SOLUTION: Freq: Once | INTRAVENOUS | Status: DC

## 2021-10-02 MED ORDER — KCL-LACTATED RINGERS-D5W 20 MEQ/L IV SOLN
INTRAVENOUS | Status: DC
  Filled 2021-10-02 (×18): qty 1000

## 2021-10-02 MED ORDER — GELATIN ABSORBABLE 12-7 MM EX MISC
CUTANEOUS | Status: AC
  Administered 2021-10-02: 1
  Filled 2021-10-02: qty 1

## 2021-10-02 MED ORDER — FENTANYL CITRATE (PF) 100 MCG/2ML IJ SOLN
INTRAMUSCULAR | Status: AC
  Filled 2021-10-02: qty 2

## 2021-10-02 MED ORDER — LACTULOSE ENEMA
300.0000 mL | Freq: Every day | ORAL | Status: DC
  Filled 2021-10-02 (×2): qty 300

## 2021-10-02 MED ORDER — FUROSEMIDE 10 MG/ML IJ SOLN: 20.0000 mg | Freq: Once | INTRAMUSCULAR | Status: DC

## 2021-10-02 MED ORDER — FENTANYL CITRATE (PF) 100 MCG/2ML IJ SOLN
INTRAMUSCULAR | Status: AC | PRN
  Administered 2021-10-02: 25 ug via INTRAVENOUS

## 2021-10-02 MED ORDER — POTASSIUM CHLORIDE 10 MEQ/100ML IV SOLN
INTRAVENOUS | Status: AC
  Administered 2021-10-02: 10 meq
  Filled 2021-10-02: qty 100

## 2021-10-02 MED ORDER — POTASSIUM CHLORIDE 10 MEQ/100ML IV SOLN
10.0000 meq | INTRAVENOUS | Status: AC
  Administered 2021-10-02 (×5): 10 meq via INTRAVENOUS
  Filled 2021-10-02 (×4): qty 100

## 2021-10-02 MED ORDER — MIDAZOLAM HCL 2 MG/2ML IJ SOLN: INTRAMUSCULAR | Status: AC | PRN

## 2021-10-02 MED ORDER — MIDAZOLAM HCL 2 MG/2ML IJ SOLN
INTRAMUSCULAR | Status: AC
  Filled 2021-10-02: qty 2

## 2021-10-02 MED ORDER — BOOST / RESOURCE BREEZE PO LIQD CUSTOM
1.0000 | Freq: Three times a day (TID) | ORAL | Status: DC
  Administered 2021-10-02 – 2021-10-20 (×35): 1 via ORAL

## 2021-10-02 NOTE — Plan of Care (Signed)
Pt given dilaudid q2-3h this shift. Brother in room and requested when pt began moaning. Problem: Pain Managment: Goal: General experience of comfort will improve Outcome: Progressing   Problem: Safety: Goal: Ability to remain free from injury will improve Outcome: Progressing   Problem: Skin Integrity: Goal: Risk for impaired skin integrity will decrease Outcome: Progressing

## 2021-10-02 NOTE — Progress Notes (Addendum)
Unfortunately, she still has confusion.  She had a CT of the brain yesterday.  This was unremarkable.  Ammonia is 49.  This is a little bit elevated.  However, I would not think that this would be the likely source of the confusion.  However, it certainly would not be a bad idea to treat this.  Her hemoglobin is down to 8.  I knew it would start going down when she got hydrated.  She needs to have 2 units of blood.  I talked to her family about this.  I explained how blood transfusions were.  I explained why she would benefit from a blood transfusion.  It may or may not help with the confusion but at least will help with organ perfusion.  They are in agreement.  She is supposed to have a liver biopsy today.  She desperately needs to have a biopsy so that we know what kind of cancer were dealing with.  I just worried that this might be an aggressive form of kidney cancer that is doing this.  Maybe, the confusion is a paraneoplastic process.  I think this would be hard to prove unless we treated the cancer itself.  She does not have a urinary tract infection.  Her urinalysis looks okay.  We are still awaiting the actual culture.  I do not know how much she is really is eating.  Her albumin is only 2.3.  This I think might be an indicator as to her overall nutritional state and her performance status.  Her potassium is 2.8.  She is going to get more potassium.  Her vital signs are temperature of 98.2.  Pulse 100.  Blood pressure 145/67.  She is somewhat sedated.  She opens her eyes.  I think she recognizes me.  Her lungs sound clear bilaterally.  Cardiac exam regular rate and rhythm.  I hear no murmurs.  Abdomen is soft.  There is no guarding or rebound tenderness.  There is no obvious abdominal mass.  Is no palpable liver or spleen tip.  Neurological exam shows no focal deficits.  Again she does have this change in cognition.  I just really thought that once we got her calcium under control that  her mental status would improve.  This is not proven to be the case as of yet.  Hopefully, the blood transfusion will help.  We will see about treating the ammonia.   I know this is incredibly complex.  Again a biopsy and some tissue will clearly help [place] out.  I would like to hope that she is going to recover her mental state in a couple of days.  If we cannot get her mental status back to her baseline, we may seriously have to think about having a talk with her family as to how far we need to proceed with treating her.  Lattie Haw, MD  Psalms [phone]

## 2021-10-02 NOTE — Procedures (Signed)
Interventional Radiology Procedure Note  Procedure: [place] CORE BX LEFT LIVER MET    Complications: None  Estimated Blood Loss:  MIN  Findings: 92 G CORES X2    M. Daryll Brod, MD

## 2021-10-02 NOTE — Progress Notes (Signed)
PROGRESS NOTE    Jane Kennedy  POE:[id number] DOB: 01/15/1946 DOA: 09/30/2021 PCP: Houston Siren., MD   Brief Narrative: 76 year old with past medical history significant for actinic keratosis, benign neoplasm of large intestine, carpal tunnel syndrome, DDD DDD lumbar sacral, diverticulosis, GERD, hyperlipidemia, IBS, osteopenia, vertigo, vitamin D deficiency presents with nausea vomiting, abdominal pain and elevated calcium level.  Evaluated outside facility was diagnosed with UTI, antibiotics prescribed.  She was not able to fill out prescription.  She was found to have potassium of 2.3, magnesium 1.7, phosphorus 2.6, calcium 12.1.  Patient admitted for further treatment of hypercalcemia.  Patient worsening delirium, agitated at times, tremors, lethargic 6/06. CT head, ABG unrevealing. Suspect delirium multifactorial  related to acute illness, initial hypercalcemia, medications (receiving opioids, ativan), increase ammonia  level, started on lactulose enema.   Underwent liver biopsy 6/07.  Assessment & Plan:   Principal Problem:   Hypercalcemia Active Problems:   Gastroesophageal reflux disease with esophagitis   Hyperlipidemia, unspecified   Hypokalemia   Left kidney mass   Moderate protein malnutrition (HCC)   1-Hypercalcemia: Likely related to hypercalcemia of malignancy in the setting of renal mass: Continue with IV fluids.  Received Zometa and Calcitonin  Calcium down to 9.7 Presents with calcium at 12.- Improved.   Acute metabolic Encephalopathy:  She became more  delirious , AMS, agitated and subsequently lethargic. 6/06. ABG negative for hypercapnia, and or hypoxia.  CT head; no acute abnormalities.  Ammonia level. Elevated at 54. Start Lactulose  Suspect related to delirium from acute illness, also medications: opioids, compazine.  She open eyes to voice, fall sleep.   Hypokalemia: Replete IV with 6 runs.  Will add potassium to IV fluids.  Repeat labs     Left Kidney Mass; diffuse liver mets, lytic bone metastasis involving L2 vertebra Appreciate Dr Marin Olp assistance.  IR consulted for liver Bx, and Kyphoplastic.   Moderate protein malnutrition: IV fluids.  Started  ensure.   GERD: IV Protonix.   Hyperlipidemia: Hold statins.   Pathological fracture L2 vertebral body: IR consulted for Kyphoplasthy   Anemia; iron deficiency; IV iron.  She will received 2 units PRBC today.  Hb at 8.0   Estimated body mass index is 23.03 kg/m as calculated from the following:   Height as of this encounter: '5\' 3"'$  (1.6 m).   Weight as of this encounter: 59 kg.   DVT prophylaxis: SCD Code Status: Full code Family Communication: Care discussed with daughter  Disposition Plan:  Status is: inpatient.    Consultants:  Dr Marin Olp.  IR  Procedures:  None  Antimicrobials:    Subjective: She is lethargic, appears less uncomfortable.  She denies pain, brother report she just received pain medication.  She fall back to sleep.   Objective: Vitals:   10/01/21 1454 10/01/21 1830 10/01/21 2232 10/02/21 0447  BP: (!) 155/78 (!) 143/79 115/81 (!) 145/67  Pulse: (!) 108 (!) 105 (!) 103 100  Resp: '17 16 20 20  '$ Temp: 98.9 F (37.2 C) 98.8 F (37.1 C) 99.9 F (37.7 C) 98.2 F (36.8 C)  TempSrc: Oral Oral Axillary Axillary  SpO2: 98% 98% 95% 93%  Weight:      Height:        Intake/Output Summary (Last 24 hours) at 10/02/2021 0713 Last data filed at 10/02/2021 0500 Gross per 24 hour  Intake 1808.8 ml  Output --  Net 1808.8 ml    Filed Weights   09/30/21 1026  Weight: 59 kg  Examination:  General exam: Lethargic Respiratory system: CTA Cardiovascular system: S 1, S 2 RRR Gastrointestinal system: BS present, soft, nt Central nervous system: Lethargic, open eyes to voice.  Extremities: no edema   Data Reviewed: I have personally reviewed following labs and imaging studies  CBC: Recent Labs  Lab 09/30/21 1055  10/01/21 0559 10/02/21 0502  WBC 12.3* 11.1* 9.2  NEUTROABS  --  8.5* 7.5  HGB 9.4* 9.1* 8.0*  HCT 31.8* 30.7* 27.3*  MCV 79.5* 80.6 80.1  PLT 354 338 161    Basic Metabolic Panel: Recent Labs  Lab 09/30/21 1055 10/01/21 0559 10/01/21 1851 10/02/21 0502  NA 139 139 142 142  K 2.3* 2.8* 3.1* 2.8*  CL 96* 101 105 106  CO2 35* '30 29 30  '$ GLUCOSE 109* 98 101* 133*  BUN '19 13 14 12  '$ CREATININE 0.94 0.75 0.79 0.69  CALCIUM 12.1* 10.4* 10.0 9.7  MG 1.7  --   --  2.2  PHOS 2.6  --   --   --     GFR: Estimated Creatinine Clearance: 49.5 mL/min (by C-G formula based on SCr of 0.69 mg/dL). Liver Function Tests: Recent Labs  Lab 09/30/21 1055 10/01/21 0559 10/02/21 0502  AST 39 40 42*  ALT '17 17 17  '$ ALKPHOS 225* 204* 172*  BILITOT 0.6 0.6 0.7  PROT 6.5 6.2* 5.8*  ALBUMIN 2.6* 2.5* 2.3*    Recent Labs  Lab 09/30/21 1055  LIPASE 22    Recent Labs  Lab 10/02/21 0502  AMMONIA 49*   Coagulation Profile: Recent Labs  Lab 10/01/21 1058  INR 1.0   Cardiac Enzymes: No results for input(s): CKTOTAL, CKMB, CKMBINDEX, TROPONINI in the last 168 hours. BNP (last 3 results) No results for input(s): PROBNP in the last 8760 hours. HbA1C: No results for input(s): HGBA1C in the last 72 hours. CBG: Recent Labs  Lab 10/01/21 1039  GLUCAP 86   Lipid Profile: No results for input(s): CHOL, HDL, LDLCALC, TRIG, CHOLHDL, LDLDIRECT in the last 72 hours. Thyroid Function Tests: No results for input(s): TSH, T4TOTAL, FREET4, T3FREE, THYROIDAB in the last 72 hours. Anemia Panel: No results for input(s): VITAMINB12, FOLATE, FERRITIN, TIBC, IRON, RETICCTPCT in the last 72 hours. Sepsis Labs: No results for input(s): PROCALCITON, LATICACIDVEN in the last 168 hours.  Recent Results (from the past 240 hour(s))  Urine Culture     Status: None   Collection Time: 09/30/21  2:17 PM   Specimen: Urine, Clean Catch  Result Value Ref Range Status   Specimen Description   Final     URINE, CLEAN CATCH Performed at [facility], [place] [address], [place], [place] [address]    Special Requests   Final    NONE Performed at [facility], [place] [address], [place], [place] [address]    Culture   Final    NO GROWTH Performed at [facility], [place] [address], [place], [place] [address]    Report Status 10/01/2021 FINAL  Final         Radiology Studies: CT HEAD WO CONTRAST (5MM)  Result Date: 10/01/2021 CLINICAL DATA:  Altered mental status EXAM: CT HEAD WITHOUT CONTRAST TECHNIQUE: Contiguous axial images were obtained from the base of the skull through the vertex without intravenous contrast. RADIATION DOSE REDUCTION: This exam was performed according to the departmental dose-optimization program which includes automated exposure control, adjustment of the mA and/or kV according to patient size and/or use of iterative reconstruction technique.  COMPARISON:  Correlation made with MRI May 2023 FINDINGS: Brain: There is no acute intracranial hemorrhage, mass effect, or edema. Gray-white differentiation is preserved. There is no extra-axial fluid collection. Prominence of the ventricles and sulci reflects mild parenchymal volume loss. Patchy hypoattenuation in the supratentorial white matter is nonspecific but may reflect mild chronic microvascular ischemic changes. Vascular: There is mild atherosclerotic calcification at the skull base. Skull: Calvarium is unremarkable. Sinuses/Orbits: No acute finding. Other: Mastoid air cells are clear. There is no right temporalis abnormality on CT corresponding to MR finding. 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 IMPRESSION: No acute intracranial abnormality. Electronically Signed   By: Macy Mis M.D.   On: 10/01/2021 12:55        Scheduled Meds:  feeding supplement  237 mL Oral BID BM   pantoprazole (PROTONIX) IV  40 mg Intravenous Q24H   Continuous Infusions:  dextrose  5% lactated ringers 100 mL/hr at 10/02/21 0151   potassium chloride 10 mEq (10/02/21 0616)     LOS: 1 day    Time spent: 35 minutes.     Elmarie Shiley, MD [facility]   If 7PM-7AM, please contact night-coverage [email or web address]  10/02/2021, 7:13 AM

## 2021-10-02 NOTE — Progress Notes (Signed)
Initial Nutrition Assessment  INTERVENTION:   -Switched Ensure to [employer] po TID, each supplement provides 250 kcal and 9 grams of protein  NUTRITION DIAGNOSIS:   Increased nutrient needs related to acute illness as evidenced by estimated needs.  GOAL:   Patient will meet greater than or equal to 90% of their needs  MONITOR:   PO intake, Supplement acceptance, Labs, Weight trends, I & O's  REASON FOR ASSESSMENT:   Malnutrition Screening Tool    ASSESSMENT:   76 year old with past medical history significant for actinic keratosis, benign neoplasm of large intestine, carpal tunnel syndrome, DDD DDD lumbar sacral, diverticulosis, GERD, hyperlipidemia, IBS, osteopenia, vertigo, vitamin D deficiency presents with nausea vomiting, abdominal pain and elevated calcium level.  Patient in room, disoriented. Mainly asking for water. Daughter at bedside providing most of history.  Currently NPO for liver biopsy today.   Pt's daughter states that the pt's husband recently passed. The funeral was 5/30. Since that time pt's appetite has been poor. She was eating meals but then on Saturday all she ate was a yogurt, some saltines, grapes and watermelon. She then developed some N/V on Sunday and hasn't eaten since. Pt has had worsening confusion.  Pt was given Ensure yesterday but pt's daughter states she vomited it back up within 30 minutes. They do not want to continue milk based supplements. Were agreeable to Boost Breeze once diet advanced.   Pt's daughter states pt's UBW is 135-140 lbs. Per weight records, pt has lost 10 lbs since 3/6 (7% wt loss x 3 months, significant for time frame).  Medications: Lasix, Lactulose, D5 infusion, KCl  Labs reviewed:  Low K  NUTRITION - FOCUSED PHYSICAL EXAM:  Flowsheet Row Most Recent Value  Orbital Region No depletion  Upper Arm Region Mild depletion  Thoracic and Lumbar Region Unable to assess  Buccal Region No depletion  Temple Region  Mild depletion  Clavicle Bone Region Mild depletion  Clavicle and Acromion Bone Region Mild depletion  Scapular Bone Region Mild depletion  Dorsal Hand Unable to assess  [mitts]  Patellar Region No depletion  Anterior Thigh Region No depletion  Posterior Calf Region No depletion  Edema (RD Assessment) None  Hair Reviewed  Eyes Unable to assess  [kept eyes closed]  Mouth Unable to assess  [lethargic]  Skin Reviewed       Diet Order:   Diet Order             Diet NPO time specified Except for: Sips with Meds  Diet effective midnight                   EDUCATION NEEDS:   Not appropriate for education at this time  Skin:  Skin Assessment: Reviewed RN Assessment  Last BM:  PTA  Height:   Ht Readings from Last 1 Encounters:  09/30/21 '5\' 3"'$  (1.6 m)    Weight:   Wt Readings from Last 1 Encounters:  09/30/21 59 kg    BMI:  Body mass index is 23.03 kg/m.  Estimated Nutritional Needs:   Kcal:  1500-1700  Protein:  70-85g  Fluid:  1.7L/day  Clayton Bibles, MS, RD, LDN Inpatient Clinical Dietitian Contact information available via Amion

## 2021-10-03 DIAGNOSIS — G9341 Metabolic encephalopathy: Secondary | ICD-10-CM

## 2021-10-03 DIAGNOSIS — D509 Iron deficiency anemia, unspecified: Secondary | ICD-10-CM

## 2021-10-03 DIAGNOSIS — K219 Gastro-esophageal reflux disease without esophagitis: Secondary | ICD-10-CM

## 2021-10-03 DIAGNOSIS — S32029A Unspecified fracture of second lumbar vertebra, initial encounter for closed fracture: Secondary | ICD-10-CM

## 2021-10-03 LAB — CBC WITH DIFFERENTIAL/PLATELET
Abs Immature Granulocytes: 0.15 10*3/uL — ABNORMAL HIGH (ref 0.00–0.07)
Basophils Absolute: 0 10*3/uL (ref 0.0–0.1)
Basophils Relative: 0 %
Eosinophils Absolute: 0.2 10*3/uL (ref 0.0–0.5)
Eosinophils Relative: 1 %
HCT: 39.7 % (ref 36.0–46.0)
Hemoglobin: 12.2 g/dL (ref 12.0–15.0)
Immature Granulocytes: 1 %
Lymphocytes Relative: 8 %
Lymphs Abs: 1 10*3/uL (ref 0.7–4.0)
MCH: 25.8 pg — ABNORMAL LOW (ref 26.0–34.0)
MCHC: 30.7 g/dL (ref 30.0–36.0)
MCV: 83.9 fL (ref 80.0–100.0)
Monocytes Absolute: 1.2 10*3/uL — ABNORMAL HIGH (ref 0.1–1.0)
Monocytes Relative: 9 %
Neutro Abs: 10.3 10*3/uL — ABNORMAL HIGH (ref 1.7–7.7)
Neutrophils Relative %: 81 %
Platelets: 291 10*3/uL (ref 150–400)
RBC: 4.73 MIL/uL (ref 3.87–5.11)
RDW: 15.1 % (ref 11.5–15.5)
WBC: 12.8 10*3/uL — ABNORMAL HIGH (ref 4.0–10.5)
nRBC: 0 % (ref 0.0–0.2)

## 2021-10-03 LAB — COMPREHENSIVE METABOLIC PANEL
ALT: 20 U/L (ref 0–44)
AST: 43 U/L — ABNORMAL HIGH (ref 15–41)
Albumin: 2.7 g/dL — ABNORMAL LOW (ref 3.5–5.0)
Alkaline Phosphatase: 193 U/L — ABNORMAL HIGH (ref 38–126)
Anion gap: 10 (ref 5–15)
BUN: 11 mg/dL (ref 8–23)
CO2: 27 mmol/L (ref 22–32)
Calcium: 9.6 mg/dL (ref 8.9–10.3)
Chloride: 103 mmol/L (ref 98–111)
Creatinine, Ser: 0.59 mg/dL (ref 0.44–1.00)
GFR, Estimated: >60 mL/min (ref >60)
Glucose, Bld: 118 mg/dL — ABNORMAL HIGH (ref 70–99)
Potassium: 3.1 mmol/L — ABNORMAL LOW (ref 3.5–5.1)
Sodium: 140 mmol/L (ref 135–145)
Total Bilirubin: 1.1 mg/dL (ref 0.3–1.2)
Total Protein: 6.6 g/dL (ref 6.5–8.1)

## 2021-10-03 MED ORDER — PANTOPRAZOLE SODIUM 40 MG PO TBEC
40.0000 mg | DELAYED_RELEASE_TABLET | Freq: Every day | ORAL | Status: DC
  Administered 2021-10-03: 40 mg via ORAL
  Filled 2021-10-03: qty 1

## 2021-10-03 MED ORDER — OXYBUTYNIN CHLORIDE 5 MG PO TABS
2.5000 mg | ORAL_TABLET | Freq: Once | ORAL | Status: AC
  Administered 2021-10-03: 2.5 mg via ORAL
  Filled 2021-10-03: qty 1

## 2021-10-03 MED ORDER — ALUM & MAG HYDROXIDE-SIMETH 200-200-20 MG/5ML PO SUSP
30.0000 mL | ORAL | Status: DC | PRN
  Administered 2021-10-03 (×2): 30 mL via ORAL
  Filled 2021-10-03 (×2): qty 30

## 2021-10-03 MED ORDER — POTASSIUM CHLORIDE CRYS ER 20 MEQ PO TBCR
40.0000 meq | EXTENDED_RELEASE_TABLET | Freq: Once | ORAL | Status: AC
Start: 2021-10-03 — End: 2021-10-03
  Administered 2021-10-03: 40 meq via ORAL
  Filled 2021-10-03: qty 2

## 2021-10-03 MED ORDER — LACTULOSE 10 GM/15ML PO SOLN
20.0000 g | Freq: Three times a day (TID) | ORAL | Status: DC
  Administered 2021-10-03 – 2021-10-04 (×3): 20 g via ORAL
  Filled 2021-10-03 (×5): qty 30

## 2021-10-03 NOTE — Evaluation (Signed)
Occupational Therapy Evaluation Patient Details Name: Jane Kennedy MRN: [id number] DOB: December 06, 1945 Today's Date: 10/03/2021   History of Present Illness Jane Kennedy is a 76 y.o. 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  Patient received phone call requesting her to go to ED from her oncologist stating her labs reflected hypercalcemia with Ca++ level of 14. Pt was found to have Ca++ of 12., K+ of 2.3 and AMS. She also c/o abdominal pain/cramping. Dr. Marin Olp has referred pt to IR for renal biopsy and L2 vertebroplasty. Dr. Dwaine Gale has approved pt for hepatic mass biopsy being the safer option. L2 kyphoplasty has been sent for insurance authorization. Biopsy tentatively scheduled for 10/02/21.   Clinical Impression   Pt. Has long hx of veritgo. Pt. Was having vertigo with minimal change in position. Pt. Is also weak and has nuasea and was not able to stand today. Pt. Was able to perform supine to sit and sit to supine with min a. Pt. Was able to sit eob for 5 min and attempted LE dressing but became nauseous. Pt. Requested to lay back down.   Acute OT to folllow.    Recommendations for follow up therapy are one component of a multi-disciplinary discharge planning process, led by the attending physician.  Recommendations may be updated based on patient status, additional functional criteria and insurance authorization.   Follow Up Recommendations   (family wants to take pt. home.)    Assistance Recommended at Discharge Frequent or constant Supervision/Assistance  Patient can return home with the following A lot of help with walking and/or transfers;A lot of help with bathing/dressing/bathroom    Functional Status Assessment  Patient has had a recent decline in their functional status and demonstrates the ability to make significant improvements in function in a reasonable and predictable amount of time.  Equipment Recommendations  None  recommended by OT    Recommendations for Other Services       Precautions / Restrictions Precautions Precautions: Fall Precaution Comments: yeas long hx of vertigo. per family no recent falls Restrictions Weight Bearing Restrictions: No      Mobility Bed Mobility Overal bed mobility: Needs Assistance Bed Mobility: Supine to Sit, Sit to Supine     Supine to sit: Min assist Sit to supine: Min assist        Transfers                   General transfer comment: unable to stand today secondary to weakness, vertigo, and nausea.      Balance Overall balance assessment: Needs assistance   Sitting balance-Leahy Scale: Fair                                     ADL either performed or assessed with clinical judgement   ADL Overall ADL's : Needs assistance/impaired Eating/Feeding: Set up;Sitting;Minimal assistance   Grooming: Wash/dry hands;Wash/dry face;Set up;Supervision/safety;Sitting   Upper Body Bathing: Maximal assistance;Sitting   Lower Body Bathing: Maximal assistance;Sitting/lateral leans   Upper Body Dressing : Maximal assistance;Sitting   Lower Body Dressing: Total assistance;Sitting/lateral leans               Functional mobility during ADLs:  (Pt. mobility limited today secondary to vertigo, weakness, and nausea.) General ADL Comments: siting eob for adls. pt. limted by nausea, weakness and vertigo.     Vision Baseline Vision/History: 1 Wears glasses (  wears reading glasses and for nighttime driving.) Ability to See in Adequate Light: 0 Adequate Patient Visual Report: No change from baseline Vision Assessment?: No apparent visual deficits     Perception     Praxis      Pertinent Vitals/Pain Pain Assessment Pain Assessment: No/denies pain (Pt. is on pain medicine)     Hand Dominance Right   Extremity/Trunk Assessment Upper Extremity Assessment Upper Extremity Assessment: Generalized weakness   Lower Extremity  Assessment Lower Extremity Assessment: Defer to PT evaluation       Communication Communication Communication: No difficulties   Cognition Arousal/Alertness: Awake/alert Behavior During Therapy: WFL for tasks assessed/performed Overall Cognitive Status: Within Functional Limits for tasks assessed                                       General Comments       Exercises     Shoulder Instructions      Home Living Family/patient expects to be discharged to:: Private residence Living Arrangements: Alone Available Help at Discharge: Family;Available 24 hours/day Type of Home: House Home Access:  (has chair lift to get into the home)     Home Layout: One level     Bathroom Shower/Tub: Occupational psychologist: Handicapped height Bathroom Accessibility: Yes How Accessible: Accessible via walker Home Equipment: [place] - single point;BSC/3in1;Shower seat;Grab bars - toilet;Grab bars - tub/shower;Hand held shower head   Additional Comments: family has wc but it is not patients and is quite wide and will not fit in bathroom.      Prior Functioning/Environment Prior Level of Function : Independent/Modified Independent;Driving               ADLs Comments: was i to Mod I with adls.        OT Problem List: Decreased strength;Decreased activity tolerance;Impaired balance (sitting and/or standing);Decreased knowledge of use of DME or AE;Decreased knowledge of precautions      OT Treatment/Interventions: Self-care/ADL training;DME and/or AE instruction;Therapeutic activities;Patient/family education    OT Goals(Current goals can be found in the care plan section) Acute Rehab OT Goals Patient Stated Goal: to be stronger OT Goal Formulation: With patient Time For Goal Achievement: 10/17/21 Potential to Achieve Goals: Fair ADL Goals Pt Will Perform Grooming: with supervision;standing Pt Will Perform Upper Body Bathing: with min assist;sitting Pt Will  Perform Lower Body Bathing: with min assist;sit to/from stand Pt Will Perform Upper Body Dressing: with min assist;sitting Pt Will Perform Lower Body Dressing: with min assist;sit to/from stand Pt Will Transfer to Toilet: with min guard assist;ambulating Pt Will Perform Toileting - Clothing Manipulation and hygiene: with supervision;sit to/from stand  OT Frequency: Min 2X/week    Co-evaluation              AM-PAC OT "6 Clicks" Daily Activity     Outcome Measure Help from another person eating meals?: A Little Help from another person taking care of personal grooming?: A Little Help from another person toileting, which includes using toliet, bedpan, or urinal?: Total Help from another person bathing (including washing, rinsing, drying)?: Total Help from another person to put on and taking off regular upper body clothing?: A Lot Help from another person to put on and taking off regular lower body clothing?: Total 6 Click Score: 11   End of Session Nurse Communication:  (ok therapy)  Activity Tolerance:   Patient left: in bed;with  call bell/phone within reach;with bed alarm set;with family/visitor present  OT Visit Diagnosis: Muscle weakness (generalized) (M62.81)                Time: 9292-4462 OT Time Calculation (min): 39 min Charges:  OT General Charges $OT Visit: 1 Visit OT Evaluation $OT Eval Moderate Complexity: 1 Mod OT Treatments $Self Care/Home Management : 8-22 mins  Reece Packer OT/L   Gustabo Gordillo 10/03/2021, 12:36 PM

## 2021-10-03 NOTE — Progress Notes (Signed)
Chaplain engaged in an initial visit with Allyn and her family.  Chaplain provided education on the Advanced Directive, Healthcare POA.  Anvi shared that her husband passed last week.  Chaplain could assess the need to get the paperwork completed, as well as the heaviness of the many transitions she has been undergoing.  She is experiencing grief while also going through major changes in her health.    During visit, Yarelis appeared to be in pain.  Chaplain offered support through getting nurse and letting her know of Verl's need.  Chaplain also offered support through listening, providing education, and a compassionate presence of care.    Chaplain explained how to get in touch with her when they are ready to have paperwork notarized.     10/03/21 0900  Clinical Encounter Type  Visited With Patient and family together  Visit Type Initial;Social support  Spiritual Encounters  Spiritual Needs Literature;Brochure  Stress Factors  Patient Stress Factors Major life changes;Health changes;Loss

## 2021-10-03 NOTE — Progress Notes (Signed)
PROGRESS NOTE Gianne Shugars  MVH:[id number] DOB: 1945-06-12 DOA: 09/30/2021 PCP: Houston Siren., MD   Brief Narrative/Hospital Course: 76 year old  f w/ actinic keratosis, benign neoplasm of large intestine, carpal tunnel syndrome, DDD DDD lumbar sacral, diverticulosis, GERD, hyperlipidemia, IBS, osteopenia, vertigo, vitamin D deficiency presented with nausea vomiting, abdominal pain and elevated calcium level.  Evaluated outside facility was diagnosed with UTI, antibiotics prescribed.  She was not able to fill out prescription.She was found to have potassium of 2.3, magnesium 1.7, phosphorus 2.6, calcium 12.1.  Patient admitted for further treatment of hypercalcemia, hypokalemia in the setting of malignancy. She has had worsening of her confusion and delirium agitated at times with tremors, was lethargic 6/6-CT head, ABG unrevealing. Suspect delirium multifactorial  related to acute illness, initial hypercalcemia, medications (receiving opioids, ativan), increase ammonia  level, started on lactulose enema.she underwent liver biopsy 6/07.  Followed by Dr. Marin Olp     Subjective: Seen this am Daughter at bedside She is more alert awake, she told date to daughter this am- wanting to eat, doing ok with drinking Got pain meds few mins ago, still confused   Assessment and Plan: Principal Problem:   Hypercalcemia Active Problems:   Gastroesophageal reflux disease with esophagitis   Hyperlipidemia, unspecified   Hypokalemia   Left kidney mass   Moderate protein malnutrition (HCC)   Acute metabolic encephalopathy   Fracture of L2 vertebra (HCC)   GERD (gastroesophageal reflux disease)   Iron deficiency anemia    Hypercalcemia: Likely related to her malignancy in the setting of renal mass/lytic lesion on the bone.  Resume Zometa and calcitonin, calcium improving.  Monitor closely, per hematology, continue gentle IV fluids.  Calcium 12.1 on 6/5- now at 9.6 with protein 2.7  Hypokalemia: 3.1  this morning continue IV fluid potassium and oral supplement  Acute metabolic encephalopathy: Became delirious agitated subsequently lethargic 6/6 had ABG CT head unrevealing, ammonia was elevated 54 started on lactulose, likely multifactorial with acute illness also with medication opiates comprising.  She has been more alert awake.  Cont supportive care of fall precaution delirium precaution  GERD: Continue PPI  Hld: Holding statin  Left kidney mass Diffuse liver mets, lytic bone metastasis involving L2 vertebra: Underwent liver biopsy 6/7, IR consulted for biopsy and kyphoplasty evaluation.  Continue pain control,  Moderate protein malnutrition: Augment nutritional status as tolerated.  Anemia iron deficiency IV iron, S/P 2 unit PRBC 6/7 monitor  Body mass index is 23.03 kg/m.  DVT prophylaxis: SCDs Start: 09/30/21 1526 Code Status:   Code Status: Full Code Family Communication: plan of care discussed with patient/daughter at bedside. Patient status is: Inpatient because of ongoing encephalopathy and work-up Level of care: Telemetry   Dispo: The patient is from: home            Anticipated disposition: TBDn  Mobility Assessment (last 72 hours)     Mobility Assessment     Row Name 10/02/21 2000 10/02/21 0705 10/01/21 2100       Does patient have an order for bedrest or is patient medically unstable No - Continue assessment No - Continue assessment No - Continue assessment     What is the highest level of mobility based on the progressive mobility assessment? Level 1 (Bedfast) - Unable to balance while sitting on edge of bed Level 1 (Bedfast) - Unable to balance while sitting on edge of bed Level 1 (Bedfast) - Unable to balance while sitting on edge of bed     Is the  above level different from baseline mobility prior to current illness? Yes - Recommend PT order Yes - Recommend PT order Yes - Recommend PT order               Objective: Vitals last 24 hrs: Vitals:    10/03/21 0140 10/03/21 0145 10/03/21 0200 10/03/21 0448  BP: (!) 141/67 (!) 141/67 138/66 124/81  Pulse: 63 63 99 100  Resp: '14 14 14 18  '$ Temp: 98 F (36.7 C) 98 F (36.7 C) 97.6 F (36.4 C) 98.5 F (36.9 C)  TempSrc: Oral  Axillary Axillary  SpO2: 98% 98% 96% 91%  Weight:      Height:       Weight change:   Physical Examination: General exam: alert awake oriented x2, confused,older than stated age, weak appearing. HEENT:Oral mucosa moist, Ear/Nose WNL grossly, dentition normal. Respiratory system: bilaterally diminished BS, no use of accessory muscle Cardiovascular system: S1 & S2 +, No JVD. Gastrointestinal system: Abdomen soft,NT,ND, BS+ Nervous System:Alert, awake, moving extremities and grossly nonfocal Extremities: LE edema neg,distal peripheral pulses palpable.  Skin: No rashes,no icterus. MSK: Normal muscle bulk,tone, power  Medications reviewed:  Scheduled Meds:  sodium chloride   Intravenous Once   feeding supplement  1 Container Oral TID BM   furosemide  20 mg Intravenous Once   furosemide  20 mg Intravenous Once   lactulose  300 mL Rectal Daily   pantoprazole (PROTONIX) IV  40 mg Intravenous Q24H   potassium chloride  40 mEq Oral Once   rifaximin  550 mg Oral BID   Continuous Infusions:  dextrose 5% lactated ringers with KCl 20 mEq/L 75 mL/hr at 10/03/21 1610      Diet Order             DIET DYS 3 Room service appropriate? Yes; Fluid consistency: Thin  Diet effective now                    Nutrition Problem: Increased nutrient needs Etiology: acute illness Signs/Symptoms: estimated needs Interventions: Boost Breeze   Intake/Output Summary (Last 24 hours) at 10/03/2021 1016 Last data filed at 10/03/2021 0955 Gross per 24 hour  Intake 1045 ml  Output 750 ml  Net 295 ml   Net IO Since Admission: 2,765.2 mL [10/03/21 1016]  Wt Readings from Last 3 Encounters:  09/30/21 59 kg  09/16/21 60.8 kg     Unresulted Labs (From admission, onward)      Start     Ordered   10/01/21 0500  CBC with Differential/Platelet  Daily,   R      09/30/21 1648   10/01/21 0500  Comprehensive metabolic panel  Daily,   R      09/30/21 1648          Data Reviewed: I have personally reviewed following labs and imaging studies CBC: Recent Labs  Lab 09/30/21 1055 10/01/21 0559 10/02/21 0502 10/03/21 0552  WBC 12.3* 11.1* 9.2 12.8*  NEUTROABS  --  8.5* 7.5 10.3*  HGB 9.4* 9.1* 8.0* 12.2  HCT 31.8* 30.7* 27.3* 39.7  MCV 79.5* 80.6 80.1 83.9  PLT 354 338 283 960   Basic Metabolic Panel: Recent Labs  Lab 09/30/21 1055 10/01/21 0559 10/01/21 1851 10/02/21 0502 10/02/21 1935 10/03/21 0552  NA 139 139 142 142 141 140  K 2.3* 2.8* 3.1* 2.8* 3.1* 3.1*  CL 96* 101 105 106 106 103  CO2 35* '30 29 30 29 27  '$ GLUCOSE 109* 98 101* 133* 131*  118*  BUN '19 13 14 12 10 11  '$ CREATININE 0.94 0.75 0.79 0.69 0.70 0.59  CALCIUM 12.1* 10.4* 10.0 9.7 9.3 9.6  MG 1.7  --   --  2.2  --   --   PHOS 2.6  --   --   --   --   --    GFR: Estimated Creatinine Clearance: 49.5 mL/min (by C-G formula based on SCr of 0.59 mg/dL). Liver Function Tests: Recent Labs  Lab 09/30/21 1055 10/01/21 0559 10/02/21 0502 10/03/21 0552  AST 39 40 42* 43*  ALT '17 17 17 20  '$ ALKPHOS 225* 204* 172* 193*  BILITOT 0.6 0.6 0.7 1.1  PROT 6.5 6.2* 5.8* 6.6  ALBUMIN 2.6* 2.5* 2.3* 2.7*   Recent Labs  Lab 09/30/21 1055  LIPASE 22   Recent Labs  Lab 10/02/21 0502  AMMONIA 49*   Coagulation Profile: Recent Labs  Lab 10/01/21 1058  INR 1.0   BNP (last 3 results) No results for input(s): "PROBNP" in the last 8760 hours. HbA1C: No results for input(s): "HGBA1C" in the last 72 hours. CBG: Recent Labs  Lab 10/01/21 1039  GLUCAP 86   Lipid Profile: No results for input(s): "CHOL", "HDL", "LDLCALC", "TRIG", "CHOLHDL", "LDLDIRECT" in the last 72 hours. Thyroid Function Tests: No results for input(s): "TSH", "T4TOTAL", "FREET4", "T3FREE", "THYROIDAB" in the last  72 hours. Sepsis Labs: No results for input(s): "PROCALCITON", "LATICACIDVEN" in the last 168 hours.  Recent Results (from the past 240 hour(s))  Urine Culture     Status: None   Collection Time: 09/30/21  2:17 PM   Specimen: Urine, Clean Catch  Result Value Ref Range Status   Specimen Description   Final    URINE, CLEAN CATCH Performed at [facility], [place] [address] [place], [place] [address]    Special Requests   Final    NONE Performed at [facility], [place] [address], [place], [place] [address]    Culture   Final    NO GROWTH Performed at [facility], [place] [address] [place], [place] [address]    Report Status 10/01/2021 FINAL  Final    Antimicrobials: Anti-infectives (From admission, onward)    Start     Dose/Rate Route Frequency Ordered Stop   10/02/21 1000  rifaximin (XIFAXAN) tablet 550 mg        550 mg Oral 2 times daily 10/02/21 0732        Culture/Microbiology    Component Value Date/Time   SDES  09/30/2021 1417    URINE, CLEAN CATCH Performed at [facility] [place] [address], [place], [place] [address]    SPECREQUEST  09/30/2021 1417    NONE Performed at [facility], [place] [address] [place], [place] [address]    CULT  09/30/2021 1417    NO GROWTH Performed at [facility], [place] [address] [place],  [address]    REPTSTATUS 10/01/2021 FINAL 09/30/2021 1417    Other culture-see note  Radiology Studies: US BIOPSY (LIVER)  Result Date: 10/02/2021 INDICATION: Imaging findings consistent with metastatic renal cell carcinoma. Liver metastases. EXAM: ULTRASOUND LEFT LIVER METASTASIS 18 GAUGE CORE BIOPSY MEDICATIONS: 1% LIDOCAINE LOCAL ANESTHESIA/SEDATION: Moderate (conscious) sedation was employed during this procedure. A total of Versed 1.0 mg and Fentanyl 25 mcg was administered intravenously by the radiology nurse. Total intra-service moderate Sedation Time: 11  minutes. The patient's level of consciousness and vital signs were monitored continuously by radiology  nursing throughout the procedure under my direct supervision. COMPLICATIONS: None immediate. PROCEDURE: Informed written consent was obtained from the patient after a thorough discussion of the procedural risks, benefits and alternatives. All questions were addressed. Maximal Sterile Barrier Technique was utilized including caps, mask, sterile gowns, sterile gloves, sterile drape, hand hygiene and skin antiseptic. A timeout was performed prior to the initiation of the procedure. previous imaging reviewed. preliminary ultrasound performed. solid hyperechoic liver lesions demonstrated correlating with the CT. a left hepatic lesion was localized and marked in the subxiphoid area for biopsy. Under sterile conditions and local anesthesia, ultrasound guidance utilized to advance a 17 gauge coaxial guide needle into a left hepatic lesion. Needle position confirmed with ultrasound. Through the access, 18 gauge core biopsies obtained. Samples were intact and non fragmented. Images obtained for documentation. These were placed in formalin. Needle tract occluded with Gel-Foam. Postprocedure imaging demonstrates no hemorrhage or hematoma. Patient tolerated the biopsy well. Of note, left renal vein and IVC thrombus noted correlating with the tumor thrombus by CT. IMPRESSION: Successful ultrasound left hepatic metastasis 18 gauge core biopsy. Electronically Signed   By: Jerilynn Mages.  Shick M.D.   On: 10/02/2021 14:46   CT HEAD WO CONTRAST (5MM)  Result Date: 10/01/2021 CLINICAL DATA:  Altered mental status EXAM: CT HEAD WITHOUT CONTRAST TECHNIQUE: Contiguous axial images were obtained from the base of the skull through the vertex without intravenous contrast. RADIATION DOSE REDUCTION: This exam was performed according to the departmental dose-optimization program which includes automated exposure control, adjustment of the mA and/or kV  according to patient size and/or use of iterative reconstruction technique. COMPARISON:  Correlation made with MRI May 2023 FINDINGS: Brain: There is no acute intracranial hemorrhage, mass effect, or edema. Gray-white differentiation is preserved. There is no extra-axial fluid collection. Prominence of the ventricles and sulci reflects mild parenchymal volume loss. Patchy hypoattenuation in the supratentorial white matter is nonspecific but may reflect mild chronic microvascular ischemic changes. Vascular: There is mild atherosclerotic calcification at the skull base. Skull: Calvarium is unremarkable. Sinuses/Orbits: No acute finding. Other: Mastoid air cells are clear. There is no right temporalis abnormality on CT corresponding to MR finding. 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 IMPRESSION: No acute intracranial abnormality. Electronically Signed   By: Macy Mis M.D.   On: 10/01/2021 12:55     LOS: 2 days   Antonieta Pert, MD [facility]  10/03/2021, 10:16 AM

## 2021-10-03 NOTE — Progress Notes (Signed)
PHARMACIST - PHYSICIAN COMMUNICATION  DR:   Lupita Leash  CONCERNING: IV to Oral Route Change Policy  RECOMMENDATION: This patient is receiving pantoprazole by the intravenous route.  Based on criteria approved by the Pharmacy and Therapeutics Committee, the intravenous medication(s) is/are being converted to the equivalent oral dose form(s).   DESCRIPTION: These criteria include: The patient is eating (either orally or via tube) and/or has been taking other orally administered medications for a least 24 hours The patient has no evidence of active gastrointestinal bleeding or impaired GI absorption (gastrectomy, short bowel, patient on TNA or NPO).  If you have questions about this conversion, please contact the Pharmacy Department  '[]'$   [phone] )  [facility] '[]'$   [phone] )  [facility] '[]'$   [phone] )  [facility] '[]'$   [phone] )  [facility] '[x]'$   [phone] )  [place], PharmD 10/03/2021 1:16 PM

## 2021-10-03 NOTE — Progress Notes (Signed)
PT Cancellation Note  Patient Details Name: Jane Kennedy MRN: [id number] DOB: 16-Jun-1945   Cancelled Treatment:    Reason Eval/Treat Not Completed: (P) Patient declined, no reason specified (Pt reports she just laid down and requests PT check back at a later time.) We will follow up as schedule and pt status allows.   Coolidge Breeze, PT, DPT [facility] Department Office: [phone] Pager: [phone]  Coolidge Breeze 10/03/2021, 1:43 PM

## 2021-10-03 NOTE — Plan of Care (Signed)
Pt more oriented this shift. Pain continues q2h dilaudid given. 2 units of blood given this shift.  Problem: Education: Goal: Knowledge of General Education information will improve Description: Including pain rating scale, medication(s)/side effects and non-pharmacologic comfort measures Outcome: Progressing   Problem: Health Behavior/Discharge Planning: Goal: Ability to manage health-related needs will improve Outcome: Progressing   Problem: Clinical Measurements: Goal: Ability to maintain clinical measurements within normal limits will improve Outcome: Progressing Goal: Will remain free from infection Outcome: Progressing Goal: Diagnostic test results will improve Outcome: Progressing Goal: Respiratory complications will improve Outcome: Progressing Goal: Cardiovascular complication will be avoided Outcome: Progressing   Problem: Activity: Goal: Risk for activity intolerance will decrease Outcome: Progressing   Problem: Nutrition: Goal: Adequate nutrition will be maintained Outcome: Progressing   Problem: Coping: Goal: Level of anxiety will decrease Outcome: Progressing   Problem: Elimination: Goal: Will not experience complications related to bowel motility Outcome: Progressing Goal: Will not experience complications related to urinary retention Outcome: Progressing   Problem: Pain Managment: Goal: General experience of comfort will improve Outcome: Progressing   Problem: Safety: Goal: Ability to remain free from injury will improve Outcome: Progressing   Problem: Skin Integrity: Goal: Risk for impaired skin integrity will decrease Outcome: Progressing

## 2021-10-03 NOTE — Hospital Course (Addendum)
76 year old  f w/ actinic keratosis, benign neoplasm of large intestine, carpal tunnel syndrome, DDD DDD lumbar sacral, diverticulosis, GERD, hyperlipidemia, IBS, osteopenia, vertigo, vitamin D deficiency presented with nausea vomiting, abdominal pain and elevated calcium level.  Evaluated outside facility was diagnosed with UTI, antibiotics prescribed.  She was not able to fill out prescription.She was found to have potassium of 2.3, magnesium 1.7, phosphorus 2.6, calcium 12.1.  Patient admitted for further treatment of hypercalcemia, hypokalemia in the setting of malignancy. She has had worsening of her confusion and delirium agitated at times with tremors, was lethargic 6/6-CT head, ABG unrevealing. Suspect delirium multifactorial  related to acute illness, initial hypercalcemia, medications (receiving opioids, ativan), increase ammonia  level, started on lactulose enema.she underwent liver biopsy 6/07.  Followed by Dr. Marin Olp

## 2021-10-03 NOTE — Progress Notes (Signed)
Overall, her mental status seems to be a little bit better.  I know she had blood yesterday.  I do not have any labs back on her yet today..  She had a biopsy done yesterday.  It is hard to say how much pain she really is in.  I am sure that she has pain because of the back issues with respect to the involvement of the lumbar spine.  She might be eating a little bit more.  Again it is hard to say how much she is eating.  She has had no headache.  She has had no cough or shortness of breath.  She has had no obvious problems with bowels or bladder.  Her urine culture did come back negative.   Her vital signs are all stable.  Temperature 98.5.  Pulse 100.  Blood pressure 124/81.  Head neck exam shows no ocular or oral lesions.  She has no adenopathy in the neck.  Lungs are clear.  Cardiac exam regular rate and rhythm.  There are no murmurs.  Abdomen is soft.  She has good bowel sounds.  There is no fluid wave.  There is no liver or spleen tip.  Extremity shows no clubbing, cyanosis or edema.  We will have to see what her labs look like.  Hopefully, she will to get out of bed a little bit.  I know she is incredibly weak.  I am not sure when the biopsy result will come back.  I do appreciate the incredible care that she is getting from everybody on 5 E.  Lattie Haw, MD  [facility] 6:9

## 2021-10-03 NOTE — Evaluation (Signed)
Physical Therapy Evaluation Patient Details Name: Jane Kennedy MRN: [id number] DOB: 05-Mar-1946 Today's Date: 10/03/2021  History of Present Illness  Pt is a 76yo female presenting to [facility] ED on 6/5 with hypercalcemia, abdominal pain, fatigue, weight loss, malaisa and AMS, suspicion for renal cell carcinoma. Brain CT unremarkable. Received two units of blood as well as liver biopsy on 6/7 which was suggestive of metastatic renal cell carcinoma.  PMH: Cancer, HLD, vertigo, b/l THA, GERD, IBS.  Clinical Impression  Pt presents with the problems listed above and functional impairments listed below. Pt required some encouragement to work with therapy for this session. Pt required min assist for bed mobility and was able to dangle at EOB for 62mn without assist, supporting on BUE. Despite encouragement, pt unable to attempt sit to stand transfer so was returned to supine positioning and repositioned for comfort in the bed. Encouraged pt to complete BUE and BLE range of motion movements in the bed when she is feeling less tired, verbalized understanding. Pt would benefit from SNF-level therapies upon discharge, family is interested in taking her home, if that remains the case recommending HHPT. We will continue to follow her acutely.      Recommendations for follow up therapy are one component of a multi-disciplinary discharge planning process, led by the attending physician.  Recommendations may be updated based on patient status, additional functional criteria and insurance authorization.  Follow Up Recommendations Other (comment) (Pt would benefit from SNF but family expressed preference to take pt home. Pt does have level of assist and ADs that would make for safe discharge.)    Assistance Recommended at Discharge Frequent or constant Supervision/Assistance  Patient can return home with the following  A lot of help with walking and/or transfers;A lot of help with bathing/dressing/bathroom;Assistance with  cooking/housework;Assistance with feeding;Direct supervision/assist for medications management;Direct supervision/assist for financial management;Assist for transportation;Help with stairs or ramp for entrance    Equipment Recommendations Rolling walker (2 wheels)  Recommendations for Other Services       Functional Status Assessment Patient has had a recent decline in their functional status and demonstrates the ability to make significant improvements in function in a reasonable and predictable amount of time.     Precautions / Restrictions Precautions Precautions: Fall Precaution Comments: yeas long hx of vertigo. per family no recent falls Restrictions Weight Bearing Restrictions: No      Mobility  Bed Mobility Overal bed mobility: Needs Assistance Bed Mobility: Supine to Sit, Sit to Supine     Supine to sit: Min assist Sit to supine: Min assist   General bed mobility comments: Pt used PT's arms to pull up to sitting. Pt was able to sit EOB for about 661m with her head resting on her hands on the RW. Reports no vertigo. Despite encouragement, pt reporting too much fatigue to continue. Returned to supine positioning with min assist.    Transfers                   General transfer comment: unable to stand today secondary to fatigue, weakness, vertigo, and nausea.    Ambulation/Gait               General Gait Details: deferred  Stairs            Wheelchair Mobility    Modified Rankin (Stroke Patients Only)       Balance Overall balance assessment: Needs assistance Sitting-balance support: Feet supported, Bilateral upper extremity supported Sitting balance-Leahy Scale: Poor Sitting  balance - Comments: Pt required BUE support on RW to maintain upright posture, also 2hand held assist on PT to prevent posterior lean.       Standing balance comment: deferred                             Pertinent Vitals/Pain Pain Assessment Pain  Assessment: No/denies pain    Home Living Family/patient expects to be discharged to:: Private residence Living Arrangements: Alone Available Help at Discharge: Family;Available 24 hours/day Type of Home: House Home Access:  (has chair lift to get into the home)       Home Layout: One level Home Equipment: Cane - single point;BSC/3in1;Shower seat;Grab bars - toilet;Grab bars - tub/shower;Hand held shower head;Rollator (4 wheels) Additional Comments: family has wc but it is not patients and is quite wide and will not fit in bathroom.    Prior Function Prior Level of Function : Independent/Modified Independent;Driving             Mobility Comments: Pt's daughter reports "3 weeks ago she was line dancing" without AD ADLs Comments: was i to Mod I with adls.     Hand Dominance   Dominant Hand: Right    Extremity/Trunk Assessment   Upper Extremity Assessment Upper Extremity Assessment: Generalized weakness    Lower Extremity Assessment Lower Extremity Assessment: Generalized weakness    Cervical / Trunk Assessment Cervical / Trunk Assessment: Kyphotic  Communication   Communication: No difficulties  Cognition Arousal/Alertness: Awake/alert (Pt very fatigued) Behavior During Therapy: WFL for tasks assessed/performed Overall Cognitive Status: Within Functional Limits for tasks assessed                                          General Comments General comments (skin integrity, edema, etc.): Pt's daughter    Exercises     Assessment/Plan    PT Assessment Patient needs continued PT services  PT Problem List Decreased strength;Decreased range of motion;Decreased activity tolerance;Decreased balance;Decreased mobility;Decreased coordination;Pain       PT Treatment Interventions DME instruction;Gait training;Stair training;Functional mobility training;Therapeutic activities;Therapeutic exercise;Balance training;Neuromuscular  re-education;Patient/family education    PT Goals (Current goals can be found in the Care Plan section)  Acute Rehab PT Goals Patient Stated Goal: To go home PT Goal Formulation: With patient/family Time For Goal Achievement: 10/17/21 Potential to Achieve Goals: Good    Frequency Min 2X/week     Co-evaluation               AM-PAC PT "6 Clicks" Mobility  Outcome Measure Help needed turning from your back to your side while in a flat bed without using bedrails?: A Little Help needed moving from lying on your back to sitting on the side of a flat bed without using bedrails?: A Little Help needed moving to and from a bed to a chair (including a wheelchair)?: A Lot Help needed standing up from a chair using your arms (e.g., wheelchair or bedside chair)?: A Lot Help needed to walk in hospital room?: A Lot Help needed climbing 3-5 steps with a railing? : Total 6 Click Score: 13    End of Session Equipment Utilized During Treatment: Gait belt Activity Tolerance: Patient limited by fatigue Patient left: in bed;with call bell/phone within reach;with bed alarm set;with family/visitor present Nurse Communication: Mobility status PT Visit Diagnosis: Muscle weakness (generalized) (M62.81);Other  abnormalities of gait and mobility (R26.89)    Time: 5910-2890 PT Time Calculation (min) (ACUTE ONLY): 17 min   Charges:   PT Evaluation $PT Eval Low Complexity: 1 Low          Coolidge Breeze, PT, DPT [facility] Rehabilitation Department Office: [phone] Pager: [phone]  Coolidge Breeze 10/03/2021, 4:15 PM

## 2021-10-04 ENCOUNTER — Inpatient Hospital Stay: Admission: RE | Admit: 2021-10-04 | Payer: Medicare HMO | Source: Ambulatory Visit

## 2021-10-04 ENCOUNTER — Encounter: Payer: Self-pay | Admitting: *Deleted

## 2021-10-04 LAB — TYPE AND SCREEN
ABO/RH(D): A POS
Antibody Screen: NEGATIVE
Unit division: 0
Unit division: 0

## 2021-10-04 LAB — BPAM RBC
Blood Product Expiration Date: 202306262359
Blood Product Expiration Date: 202306272359
ISSUE DATE / TIME: 202306072314
ISSUE DATE / TIME: 202306080131
Unit Type and Rh: 6200
Unit Type and Rh: 6200

## 2021-10-04 LAB — CBC WITH DIFFERENTIAL/PLATELET
Abs Immature Granulocytes: 0.1 10*3/uL — ABNORMAL HIGH (ref 0.00–0.07)
Basophils Absolute: 0 10*3/uL (ref 0.0–0.1)
Basophils Relative: 0 %
Eosinophils Absolute: 0.3 10*3/uL (ref 0.0–0.5)
Eosinophils Relative: 3 %
HCT: 36.3 % (ref 36.0–46.0)
Hemoglobin: 11 g/dL — ABNORMAL LOW (ref 12.0–15.0)
Immature Granulocytes: 1 %
Lymphocytes Relative: 12 %
Lymphs Abs: 1.2 10*3/uL (ref 0.7–4.0)
MCH: 25.7 pg — ABNORMAL LOW (ref 26.0–34.0)
MCHC: 30.3 g/dL (ref 30.0–36.0)
MCV: 84.8 fL (ref 80.0–100.0)
Monocytes Absolute: 0.9 10*3/uL (ref 0.1–1.0)
Monocytes Relative: 9 %
Neutro Abs: 8 10*3/uL — ABNORMAL HIGH (ref 1.7–7.7)
Neutrophils Relative %: 75 %
Platelets: 247 10*3/uL (ref 150–400)
RBC: 4.28 MIL/uL (ref 3.87–5.11)
RDW: 15.8 % — ABNORMAL HIGH (ref 11.5–15.5)
WBC: 10.5 10*3/uL (ref 4.0–10.5)
nRBC: 0.2 % (ref 0.0–0.2)

## 2021-10-04 LAB — COMPREHENSIVE METABOLIC PANEL
ALT: 22 U/L (ref 0–44)
AST: 42 U/L — ABNORMAL HIGH (ref 15–41)
Albumin: 2.3 g/dL — ABNORMAL LOW (ref 3.5–5.0)
Alkaline Phosphatase: 182 U/L — ABNORMAL HIGH (ref 38–126)
Anion gap: 8 (ref 5–15)
BUN: 9 mg/dL (ref 8–23)
CO2: 26 mmol/L (ref 22–32)
Calcium: 8.9 mg/dL (ref 8.9–10.3)
Chloride: 106 mmol/L (ref 98–111)
Creatinine, Ser: 0.61 mg/dL (ref 0.44–1.00)
GFR, Estimated: >60 mL/min (ref >60)
Glucose, Bld: 149 mg/dL — ABNORMAL HIGH (ref 70–99)
Potassium: 3.4 mmol/L — ABNORMAL LOW (ref 3.5–5.1)
Sodium: 140 mmol/L (ref 135–145)
Total Bilirubin: 0.7 mg/dL (ref 0.3–1.2)
Total Protein: 5.8 g/dL — ABNORMAL LOW (ref 6.5–8.1)

## 2021-10-04 MED ORDER — SUCRALFATE 1 GM/10ML PO SUSP
1.0000 g | Freq: Three times a day (TID) | ORAL | Status: DC
  Administered 2021-10-04 – 2021-10-18 (×52): 1 g via ORAL
  Filled 2021-10-04 (×54): qty 10

## 2021-10-04 MED ORDER — POTASSIUM CHLORIDE CRYS ER 20 MEQ PO TBCR
20.0000 meq | EXTENDED_RELEASE_TABLET | Freq: Once | ORAL | Status: AC
  Administered 2021-10-04: 20 meq via ORAL
  Filled 2021-10-04: qty 1

## 2021-10-04 MED ORDER — PANTOPRAZOLE SODIUM 40 MG PO TBEC
40.0000 mg | DELAYED_RELEASE_TABLET | Freq: Two times a day (BID) | ORAL | Status: DC
  Administered 2021-10-04 – 2021-10-18 (×29): 40 mg via ORAL
  Filled 2021-10-04 (×29): qty 1

## 2021-10-04 NOTE — Progress Notes (Signed)
Per Dr Marin Olp, request for Foundation One testing sent on [id number] DOS 10/02/2021.  Dr Marin Olp requests clarification on whether or not the path is consistent with clear cell. Message sent to [facility] in pathology.   Patient remains hospitalized. Will continue to follow for post discharge needs and office follow up.   Oncology Nurse Navigator Documentation     10/04/2021    9:45 AM  Oncology Nurse Navigator Flowsheets  Confirmed Diagnosis Date 10/02/2021  Diagnosis Status Pending Molecular Studies  Navigator Follow Up Date: 10/08/2021  Navigator Follow Up Reason: Appointment Review  Navigator Location [facility]  Navigator Encounter Type Molecular Studies;Pathology Review  Patient Visit Type MedOnc  Treatment Phase Pre-Tx/Tx Discussion  Barriers/Navigation Needs Coordination of Care;Education;Family Concerns  Interventions Coordination of Care  Acuity Level 2-Minimal Needs (1-2 Barriers Identified)  Coordination of Care Pathology  Support Groups/Services Friends and Family  Time Spent with Patient 74

## 2021-10-04 NOTE — Progress Notes (Signed)
PROGRESS NOTE Jane Kennedy  ZRA:[id number] DOB: March 01, 1946 DOA: 09/30/2021 PCP: Houston Siren., MD   Brief Narrative/Hospital Course: 76 year old  f w/ actinic keratosis, benign neoplasm of large intestine, carpal tunnel syndrome, DDD DDD lumbar sacral, diverticulosis, GERD, hyperlipidemia, IBS, osteopenia, vertigo, vitamin D deficiency presented with nausea vomiting, abdominal pain and elevated calcium level.  Evaluated outside facility was diagnosed with UTI, antibiotics prescribed.  She was not able to fill out prescription.She was found to have potassium of 2.3, magnesium 1.7, phosphorus 2.6, calcium 12.1.  Patient admitted for further treatment of hypercalcemia, hypokalemia in the setting of malignancy. She has had worsening of her confusion and delirium agitated at times with tremors, was lethargic 6/6-CT head, ABG unrevealing. Suspect delirium multifactorial  related to acute illness, initial hypercalcemia, medications (receiving opioids, ativan), increase ammonia  level, started on lactulose enema.she underwent liver biopsy 6/07.  Followed by Dr. Marin Olp   Subjective: Seen examined Brother at bedside More alert awake and oriented this am Overnight Potassium increasing at 3.4 leukocytosis resolved.  Assessment and Plan: Principal Problem:   Hypercalcemia Active Problems:   Gastroesophageal reflux disease with esophagitis   Hyperlipidemia, unspecified   Hypokalemia   Left kidney mass   Moderate protein malnutrition (HCC)   Acute metabolic encephalopathy   Fracture of L2 vertebra (HCC)   GERD (gastroesophageal reflux disease)   Iron deficiency anemia   Hypercalcemia: Likely related to her malignancy in the setting of renal mass/lytic lesion on the bone.  Received Zometa and calcitonin, calcium improving> 8.9 this morning Indo-Lemmon 2.3.  Monitor electrolytes closely -cut down IV fluids. Calcium 12.1 on 6/5  Hypokalemia: Replete orally again this morning improving   Acute  metabolic encephalopathy: Became delirious agitated subsequently lethargic 6/6 had ABG CT head unrevealing, ammonia was elevated 54 started on lactulose, likely multifactorial with acute illness also with medication opiates comprising.  Mental status since has significantly improved more alert awake oriented.   GERD: Continue PPI Hld: Holding statin  Left kidney mass Diffuse liver mets, lytic bone metastasis involving L2 vertebra: Underwent liver biopsy 6/7, IR consulted kyphoplasty evaluation.  Continue pain control.  Managed by Dr. Marin Olp  Moderate protein malnutrition: Augment nutritional status as tolerated.  Anemia iron deficiency IV iron, S/P 2 unit PRBC 6/7 monitor Hemoglobin is stabilized at 11 g.  Monitor  Body mass index is 23.03 kg/m.  DVT prophylaxis: SCDs Start: 09/30/21 1526 Code Status:   Code Status: Full Code Family Communication: plan of care discussed with patient/BROTHER at bedside. Patient status is: Inpatient because of ongoing encephalopathy and work-up Level of care: Med-Surg   Dispo: The patient is from: home            Anticipated disposition: TBD-PT OT recommends skilled nursing facility but patient and family prefers to go home with home health  Mobility Assessment (last 72 hours)     Mobility Assessment     Row Name 10/03/21 1605 10/03/21 1100 10/02/21 2000 10/02/21 0705 10/01/21 2100   Does patient have an order for bedrest or is patient medically unstable -- -- No - Continue assessment No - Continue assessment No - Continue assessment   What is the highest level of mobility based on the progressive mobility assessment? Level 2 (Chairfast) - Balance while sitting on edge of bed and cannot stand Level 2 (Chairfast) - Balance while sitting on edge of bed and cannot stand Level 1 (Bedfast) - Unable to balance while sitting on edge of bed Level 1 (Bedfast) - Unable to balance  while sitting on edge of bed Level 1 (Bedfast) - Unable to balance while sitting on  edge of bed   Is the above level different from baseline mobility prior to current illness? -- -- Yes - Recommend PT order Yes - Recommend PT order Yes - Recommend PT order             Objective: Vitals last 24 hrs: Vitals:   10/03/21 0448 10/03/21 1749 10/03/21 2127 10/04/21 0440  BP: 124/81 134/69 (!) 144/67 132/63  Pulse: 100 95 98 92  Resp: '18 20 16 16  '$ Temp: 98.5 F (36.9 C) 98.7 F (37.1 C) 98.5 F (36.9 C) 98.5 F (36.9 C)  TempSrc: Axillary     SpO2: 91% 92% 96% (!) 87%  Weight:      Height:       Weight change:   Physical Examination: General exam: AA oriented x2-3, older than stated age, weak appearing. HEENT:Oral mucosa moist, Ear/Nose WNL grossly, dentition normal. Respiratory system: bilaterally clear, no use of accessory muscle Cardiovascular system: S1 & S2 +, No JVD,. Gastrointestinal system: Abdomen soft,NT,ND,BS+ Nervous System:Alert, awake, moving extremities and grossly nonfocal Extremities: LE ankle edema minimal, distal peripheral pulses palpable.  Skin: No rashes,no icterus. MSK: Normal muscle bulk,tone, power   Medications reviewed:  Scheduled Meds:  sodium chloride   Intravenous Once   feeding supplement  1 Container Oral TID BM   furosemide  20 mg Intravenous Once   furosemide  20 mg Intravenous Once   lactulose  20 g Oral TID   pantoprazole  40 mg Oral BID   rifaximin  550 mg Oral BID   sucralfate  1 g Oral TID WC & HS   Continuous Infusions:  dextrose 5% lactated ringers with KCl 20 mEq/L 75 mL/hr at 10/03/21 2235      Diet Order             DIET DYS 3 Room service appropriate? Yes; Fluid consistency: Thin  Diet effective now                    Nutrition Problem: Increased nutrient needs Etiology: acute illness Signs/Symptoms: estimated needs Interventions: Boost Breeze   Intake/Output Summary (Last 24 hours) at 10/04/2021 0854 Last data filed at 10/04/2021 0300 Gross per 24 hour  Intake 2123.4 ml  Output 200 ml   Net 1923.4 ml    Net IO Since Admission: 4,333.6 mL [10/04/21 0854]  Wt Readings from Last 3 Encounters:  09/30/21 59 kg  09/16/21 60.8 kg     Unresulted Labs (From admission, onward)     Start     Ordered   10/01/21 0500  CBC with Differential/Platelet  Daily,   R      09/30/21 1648   10/01/21 0500  Comprehensive metabolic panel  Daily,   R      09/30/21 1648          Data Reviewed: I have personally reviewed following labs and imaging studies CBC: Recent Labs  Lab 09/30/21 1055 10/01/21 0559 10/02/21 0502 10/03/21 0552 10/04/21 0534  WBC 12.3* 11.1* 9.2 12.8* 10.5  NEUTROABS  --  8.5* 7.5 10.3* 8.0*  HGB 9.4* 9.1* 8.0* 12.2 11.0*  HCT 31.8* 30.7* 27.3* 39.7 36.3  MCV 79.5* 80.6 80.1 83.9 84.8  PLT 354 338 283 291 462    Basic Metabolic Panel: Recent Labs  Lab 09/30/21 1055 10/01/21 0559 10/01/21 1851 10/02/21 0502 10/02/21 1935 10/03/21 0552 10/04/21 0534  NA 139   < >  142 142 141 140 140  K 2.3*   < > 3.1* 2.8* 3.1* 3.1* 3.4*  CL 96*   < > 105 106 106 103 106  CO2 35*   < > '29 30 29 27 26  '$ GLUCOSE 109*   < > 101* 133* 131* 118* 149*  BUN 19   < > '14 12 10 11 9  '$ CREATININE 0.94   < > 0.79 0.69 0.70 0.59 0.61  CALCIUM 12.1*   < > 10.0 9.7 9.3 9.6 8.9  MG 1.7  --   --  2.2  --   --   --   PHOS 2.6  --   --   --   --   --   --    < > = values in this interval not displayed.    GFR: Estimated Creatinine Clearance: 49.5 mL/min (by C-G formula based on SCr of 0.61 mg/dL). Liver Function Tests: Recent Labs  Lab 09/30/21 1055 10/01/21 0559 10/02/21 0502 10/03/21 0552 10/04/21 0534  AST 39 40 42* 43* 42*  ALT '17 17 17 20 22  '$ ALKPHOS 225* 204* 172* 193* 182*  BILITOT 0.6 0.6 0.7 1.1 0.7  PROT 6.5 6.2* 5.8* 6.6 5.8*  ALBUMIN 2.6* 2.5* 2.3* 2.7* 2.3*    Recent Labs  Lab 09/30/21 1055  LIPASE 22    Recent Labs  Lab 10/02/21 0502  AMMONIA 49*    Coagulation Profile: Recent Labs  Lab 10/01/21 1058  INR 1.0    BNP (last 3  results) No results for input(s): "PROBNP" in the last 8760 hours. HbA1C: No results for input(s): "HGBA1C" in the last 72 hours. CBG: Recent Labs  Lab 10/01/21 1039  GLUCAP 86    Lipid Profile: No results for input(s): "CHOL", "HDL", "LDLCALC", "TRIG", "CHOLHDL", "LDLDIRECT" in the last 72 hours. Thyroid Function Tests: No results for input(s): "TSH", "T4TOTAL", "FREET4", "T3FREE", "THYROIDAB" in the last 72 hours. Sepsis Labs: No results for input(s): "PROCALCITON", "LATICACIDVEN" in the last 168 hours.  Recent Results (from the past 240 hour(s))  Urine Culture     Status: None   Collection Time: 09/30/21  2:17 PM   Specimen: Urine, Clean Catch  Result Value Ref Range Status   Specimen Description   Final    URINE, CLEAN CATCH Performed at [facility], [place] [address] [place], [place] [address]    Special Requests   Final    NONE Performed at [facility], [place] [address], [place], [place] [address]    Culture   Final    NO GROWTH Performed at [facility], [place] [address] [place], [place] [address]    Report Status 10/01/2021 FINAL  Final    Antimicrobials: Anti-infectives (From admission, onward)    Start     Dose/Rate Route Frequency Ordered Stop   10/02/21 1000  rifaximin (XIFAXAN) tablet 550 mg        550 mg Oral 2 times daily 10/02/21 0732        Culture/Microbiology    Component Value Date/Time   SDES  09/30/2021 1417    URINE, CLEAN CATCH Performed at [facility], [place] [address], [place], [place] [address]    SPECREQUEST  09/30/2021 1417    NONE Performed at [facility], [place] [address] [place], [place] [address]    CULT  09/30/2021 1417    NO GROWTH Performed at [facility], [place] [address] [place],  [address]  REPTSTATUS 10/01/2021 FINAL 09/30/2021 1417    Other culture-see note  Radiology Studies: US BIOPSY (LIVER)  Result Date:  10/02/2021 INDICATION: Imaging findings consistent with metastatic renal cell carcinoma. Liver metastases. EXAM: ULTRASOUND LEFT LIVER METASTASIS 18 GAUGE CORE BIOPSY MEDICATIONS: 1% LIDOCAINE LOCAL ANESTHESIA/SEDATION: Moderate (conscious) sedation was employed during this procedure. A total of Versed 1.0 mg and Fentanyl 25 mcg was administered intravenously by the radiology nurse. Total intra-service moderate Sedation Time: 11 minutes. The patient's level of consciousness and vital signs were monitored continuously by radiology nursing throughout the procedure under my direct supervision. COMPLICATIONS: None immediate. PROCEDURE: Informed written consent was obtained from the patient after a thorough discussion of the procedural risks, benefits and alternatives. All questions were addressed. Maximal Sterile Barrier Technique was utilized including caps, mask, sterile gowns, sterile gloves, sterile drape, hand hygiene and skin antiseptic. A timeout was performed prior to the initiation of the procedure. previous imaging reviewed. preliminary ultrasound performed. solid hyperechoic liver lesions demonstrated correlating with the CT. a left hepatic lesion was localized and marked in the subxiphoid area for biopsy. Under sterile conditions and local anesthesia, ultrasound guidance utilized to advance a 17 gauge coaxial guide needle into a left hepatic lesion. Needle position confirmed with ultrasound. Through the access, 18 gauge core biopsies obtained. Samples were intact and non fragmented. Images obtained for documentation. These were placed in formalin. Needle tract occluded with Gel-Foam. Postprocedure imaging demonstrates no hemorrhage or hematoma. Patient tolerated the biopsy well. Of note, left renal vein and IVC thrombus noted correlating with the tumor thrombus by CT. IMPRESSION: Successful ultrasound left hepatic metastasis 18 gauge core biopsy. Electronically Signed   By: Jerilynn Mages.  Shick M.D.   On: 10/02/2021  14:46     LOS: 3 days   Antonieta Pert, MD [facility]  10/04/2021, 8:54 AM

## 2021-10-04 NOTE — Care Management Important Message (Signed)
Important Message  Patient Details IM Letter given to the Patient. Name: Jane Kennedy MRN: [id number] Date of Birth: [birth year]   Medicare Important Message Given:  Yes     Kerin Salen 10/04/2021, 11:23 AM

## 2021-10-04 NOTE — TOC Initial Note (Signed)
Transition of Care [facility]) - Initial/Assessment Note    Patient Details  Name: Jane Kennedy MRN: [id number] Date of Birth: Dec 22, 1945  Transition of Care [facility]) CM/SW Contact:    Vassie Moselle, LCSW Phone Number: 10/04/2021, 10:52 AM  Clinical Narrative:                 Met with patient and daughter, Sena Slate, and confirmed families plan for this patient to return home at discharge. Family is adamant that this patient will not being going to SNF placement at discharge. Pt's daughter reports that this patient will be staying with her and will have 24 hr supervision and support. Pt's daughter reports that they have walkers at home that were her fathers/pt's spouse however, these walkers are much to big for pt to use. RW has been ordered through [employer] and will be delivered to pt's room. Pt and family are agreeable to [facility] services for continued PT and possibly OT at discharge. CSW will continue to follow pt for discharge needs and final recommendations of OT/PT.   Expected Discharge Plan: [place] Barriers to Discharge: Continued Medical Work up   Patient Goals and CMS Choice Patient states their goals for this hospitalization and ongoing recovery are:: Return home   Choice offered to / list presented to : Patient  Expected Discharge Plan and Services Expected Discharge Plan: [place] In-house Referral: Clinical Social Work Discharge Planning Services: CM Consult Post Acute Care Choice: [place] arrangements for the past 2 months: Single Family Home                 DME Arranged: Walker rolling DME Agency: [employer] Date DME Agency Contacted: 10/04/21 Time DME Agency Contacted: [id number] Representative spoke with at DME Agency: Andee Poles            Prior Living Arrangements/Services Living arrangements for the past 2 months: [place] with:: Adult Children Patient language and need for interpreter reviewed:: Yes Do you  feel safe going back to the place where you live?: Yes      Need for Family Participation in Patient Care: Yes (Comment) Care giver support system in place?: Yes (comment) Current home services: Other (comment) (None) Criminal Activity/Legal Involvement Pertinent to Current Situation/Hospitalization: No - Comment as needed  Activities of Daily Living Home Assistive Devices/Equipment: None ADL Screening (condition at time of admission) Patient's cognitive ability adequate to safely complete daily activities?: No Is the patient deaf or have difficulty hearing?: No Does the patient have difficulty seeing, even when wearing glasses/contacts?: No Does the patient have difficulty concentrating, remembering, or making decisions?: Yes Patient able to express need for assistance with ADLs?: No Does the patient have difficulty dressing or bathing?: Yes Independently performs ADLs?: No Communication: Needs assistance Dressing (OT): Dependent Grooming: Dependent Feeding: Needs assistance Bathing: Dependent Toileting: Dependent In/Out Bed: Needs assistance Walks in Home: Needs assistance Does the patient have difficulty walking or climbing stairs?: Yes Weakness of Legs: Both Weakness of Arms/Hands: Both  Permission Sought/Granted Permission sought to share information with : Facility Sport and exercise psychologist, Family Supports Permission granted to share information with : Yes, Verbal Permission Granted  Share Information with NAME: Robbi Garter     Permission granted to share info w Relationship: Daughter  Permission granted to share info w Contact Information: [phone]  Emotional Assessment Appearance:: Appears stated age Attitude/Demeanor/Rapport: Gracious Affect (typically observed): Flat, Pleasant Orientation: : Oriented to Self, Oriented to Place, Oriented to  Time, Oriented to Situation Alcohol / Substance Use: Not Applicable Psych Involvement: No (comment)  Admission  diagnosis:  Hypercalcemia [E83.52] Hypokalemia [E87.6] Patient Active Problem List   Diagnosis Date Noted   Acute metabolic encephalopathy 31/67/4255   Fracture of L2 vertebra ([place]) 10/03/2021   GERD (gastroesophageal reflux disease) 10/03/2021   Iron deficiency anemia 10/03/2021   Hypercalcemia 09/30/2021   Hypokalemia 09/30/2021   Left kidney mass 09/30/2021   Moderate protein malnutrition ([place]) 09/30/2021   Acute pain of right shoulder 02/15/2019   Gastroesophageal reflux disease with esophagitis 02/02/2018   Vertigo 02/02/2018   Seasonal allergic rhinitis due to pollen 03/06/2017   Actinic keratosis 12/01/2016   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 [place] [address] Phone: [phone] Fax: [phone]     Social Determinants of Health (SDOH) Interventions    Readmission Risk Interventions     No data to display

## 2021-10-04 NOTE — Plan of Care (Signed)

## 2021-10-05 LAB — CBC WITH DIFFERENTIAL/PLATELET
Abs Immature Granulocytes: 0.1 K/uL — ABNORMAL HIGH (ref 0.00–0.07)
Basophils Absolute: 0 K/uL (ref 0.0–0.1)
Basophils Relative: 0 %
Eosinophils Absolute: 0.2 K/uL (ref 0.0–0.5)
Eosinophils Relative: 2 %
HCT: 36.2 % (ref 36.0–46.0)
Hemoglobin: 11.1 g/dL — ABNORMAL LOW (ref 12.0–15.0)
Immature Granulocytes: 1 %
Lymphocytes Relative: 15 %
Lymphs Abs: 1.4 K/uL (ref 0.7–4.0)
MCH: 25.6 pg — ABNORMAL LOW (ref 26.0–34.0)
MCHC: 30.7 g/dL (ref 30.0–36.0)
MCV: 83.6 fL (ref 80.0–100.0)
Monocytes Absolute: 1 K/uL (ref 0.1–1.0)
Monocytes Relative: 10 %
Neutro Abs: 6.9 K/uL (ref 1.7–7.7)
Neutrophils Relative %: 72 %
Platelets: 222 K/uL (ref 150–400)
RBC: 4.33 MIL/uL (ref 3.87–5.11)
RDW: 16.4 % — ABNORMAL HIGH (ref 11.5–15.5)
WBC: 9.6 K/uL (ref 4.0–10.5)
nRBC: 0 % (ref 0.0–0.2)

## 2021-10-05 LAB — COMPREHENSIVE METABOLIC PANEL
ALT: 24 U/L (ref 0–44)
AST: 46 U/L — ABNORMAL HIGH (ref 15–41)
Albumin: 2.4 g/dL — ABNORMAL LOW (ref 3.5–5.0)
Alkaline Phosphatase: 224 U/L — ABNORMAL HIGH (ref 38–126)
Anion gap: 7 (ref 5–15)
BUN: 7 mg/dL — ABNORMAL LOW (ref 8–23)
CO2: 26 mmol/L (ref 22–32)
Calcium: 8.9 mg/dL (ref 8.9–10.3)
Chloride: 107 mmol/L (ref 98–111)
Creatinine, Ser: 0.57 mg/dL (ref 0.44–1.00)
GFR, Estimated: >60 mL/min (ref >60)
Glucose, Bld: 123 mg/dL — ABNORMAL HIGH (ref 70–99)
Potassium: 3.7 mmol/L (ref 3.5–5.1)
Sodium: 140 mmol/L (ref 135–145)
Total Bilirubin: 0.7 mg/dL (ref 0.3–1.2)
Total Protein: 5.7 g/dL — ABNORMAL LOW (ref 6.5–8.1)

## 2021-10-05 MED ORDER — ENOXAPARIN SODIUM 40 MG/0.4ML IJ SOSY
40.0000 mg | PREFILLED_SYRINGE | INTRAMUSCULAR | Status: DC
Start: 2021-10-05 — End: 2021-10-10
  Administered 2021-10-05 – 2021-10-09 (×5): 40 mg via SUBCUTANEOUS
  Filled 2021-10-05 (×5): qty 0.4

## 2021-10-05 MED ORDER — ALUM & MAG HYDROXIDE-SIMETH 200-200-20 MG/5ML PO SUSP
30.0000 mL | Freq: Once | ORAL | Status: AC
  Administered 2021-10-05: 30 mL via ORAL
  Filled 2021-10-05: qty 30

## 2021-10-05 MED ORDER — LIDOCAINE VISCOUS HCL 2 % MT SOLN
15.0000 mL | Freq: Once | OROMUCOSAL | Status: AC
  Administered 2021-10-05: 15 mL via ORAL
  Filled 2021-10-05: qty 15

## 2021-10-05 NOTE — Progress Notes (Signed)
PROGRESS NOTE  Jane Kennedy RCV:[id number] DOB: 27-Dec-1945 DOA: 09/30/2021 PCP: Houston Siren., MD  HPI/Recap of past 27 hours: 76 year old  f w/ actinic keratosis, benign neoplasm of large intestine, carpal tunnel syndrome, DDD lumbar sacral, diverticulosis, GERD, hyperlipidemia, IBS, osteopenia, vertigo, vitamin D deficiency presented with nausea, vomiting, abdominal pain and elevated calcium level.  Evaluated outside facility was diagnosed with UTI, antibiotics prescribed.  She was not able to fill out prescription.  She was found to have potassium of 2.3, magnesium 1.7, phosphorus 2.6, calcium 12.1.  Patient admitted for further treatment of hypercalcemia, hypokalemia in the setting of malignancy. She has had worsening of confusion and delirium, agitated at times with tremors, was lethargic 6/6-CT head, ABG unrevealing. Suspect delirium multifactorial  related to acute illness, initial hypercalcemia, medications (receiving opioids, ativan), increase ammonia  level, started on lactulose enema..  She underwent liver biopsy 6/07.  Followed by Dr. Marin Olp.  10/05/2021: Patient was seen and examined at bedside.  She reports having indigestion.  She does not like to take Pepto-Bismol.    Assessment/Plan: Principal Problem:   Hypercalcemia Active Problems:   Gastroesophageal reflux disease with esophagitis   Hyperlipidemia, unspecified   Hypokalemia   Left kidney mass   Moderate protein malnutrition (HCC)   Acute metabolic encephalopathy   Fracture of L2 vertebra (H[facility])   GERD (gastroesophageal reflux disease)   Iron deficiency anemia   Resolved hypercalcemia: Likely related to her malignancy in the setting of renal mass/lytic lesion on the bone.  Received Zometa and calcitonin, calcium improving> 8.9 this morning albumin 2.4.  Corrected calcium for albumin 10.2.     Resolved hypokalemia: Serum potassium 3.7.  Elevated liver chemistries Alkaline phosphatase, AST ALT uptrending.    Resolved acute metabolic encephalopathy:  Delirium and fall precautions Follow-up repeat ammonia level   GERD/indigestion:  Continue PPI twice daily. Supportive care.  Hyperlipidemia: Home statin on hold due to elevated liver chemistries   Left kidney mass Diffuse liver mets, lytic bone metastasis involving L2 vertebra: Underwent liver biopsy 6/7, IR consulted kyphoplasty evaluation.  Continue pain control.  Managed by Dr. Marin Olp Possible IR kyphoplasty on 10/07/2021   Moderate protein malnutrition: Liberalize diet   Anemia iron deficiency IV iron, S/P 2 unit PRBC 6/7 monitor Hemoglobin is stabilized at 11.1 g.  Monitor   Body mass index is 23.03 kg/m.   DVT prophylaxis: SCDs Start: 09/30/21 1526.  Subcu Lovenox daily. Code Status:   Code Status: Full Code Family Communication: plan of care discussed with patient/BROTHER at bedside. Patient status is: Inpatient because of ongoing encephalopathy and work-up Level of care: Med-Surg    Dispo: The patient is from: home            Anticipated disposition: Likely discharge to home with home health services after kyphoplasty on Monday.   Mobility Assessment (last 72 hours)      Status is: Inpatient The patient requires at least 2 midnight for further evaluation and treatment of present condition.    Objective: Vitals:   10/04/21 0440 10/04/21 2019 10/05/21 0507 10/05/21 1421  BP: 132/63 127/72 135/67 (!) 145/69  Pulse: 92 100 (!) 103 (!) 106  Resp: '16 16 16 17  '$ Temp: 98.5 F (36.9 C) 99.2 F (37.3 C) 98.3 F (36.8 C) 99.2 F (37.3 C)  TempSrc:  Oral Oral Oral  SpO2: (!) 87% 95% 94% 97%  Weight:      Height:        Intake/Output Summary (Last 24 hours) at 10/05/2021  [place] filed at 10/04/2021 2022 Gross per 24 hour  Intake --  Output 400 ml  Net -400 ml   Filed Weights   09/30/21 1026  Weight: 59 kg    Exam:  General: 76 y.o. year-old female well developed well nourished in no acute distress.   Alert and oriented x3.  Uncomfortable due to back pain. Cardiovascular: Regular rate and rhythm with no rubs or gallops.  No thyromegaly or JVD noted.   Respiratory: Clear to auscultation with no wheezes or rales. Good inspiratory effort. Abdomen: Soft nontender nondistended with normal bowel sounds x4 quadrants. Musculoskeletal: No lower extremity edema. 2/4 pulses in all 4 extremities. Skin: No ulcerative lesions noted or rashes, Psychiatry: Mood is appropriate for condition and setting   Data Reviewed: CBC: Recent Labs  Lab 10/01/21 0559 10/02/21 0502 10/03/21 0552 10/04/21 0534 10/05/21 0620  WBC 11.1* 9.2 12.8* 10.5 9.6  NEUTROABS 8.5* 7.5 10.3* 8.0* 6.9  HGB 9.1* 8.0* 12.2 11.0* 11.1*  HCT 30.7* 27.3* 39.7 36.3 36.2  MCV 80.6 80.1 83.9 84.8 83.6  PLT 338 283 291 247 315   Basic Metabolic Panel: Recent Labs  Lab 09/30/21 1055 10/01/21 0559 10/02/21 0502 10/02/21 1935 10/03/21 0552 10/04/21 0534 10/05/21 0620  NA 139   < > 142 141 140 140 140  K 2.3*   < > 2.8* 3.1* 3.1* 3.4* 3.7  CL 96*   < > 106 106 103 106 107  CO2 35*   < > '30 29 27 26 26  '$ GLUCOSE 109*   < > 133* 131* 118* 149* 123*  BUN 19   < > '12 10 11 9 '$ 7*  CREATININE 0.94   < > 0.69 0.70 0.59 0.61 0.57  CALCIUM 12.1*   < > 9.7 9.3 9.6 8.9 8.9  MG 1.7  --  2.2  --   --   --   --   PHOS 2.6  --   --   --   --   --   --    < > = values in this interval not displayed.   GFR: Estimated Creatinine Clearance: 49.5 mL/min (by C-G formula based on SCr of 0.57 mg/dL). Liver Function Tests: Recent Labs  Lab 10/01/21 0559 10/02/21 0502 10/03/21 0552 10/04/21 0534 10/05/21 0620  AST 40 42* 43* 42* 46*  ALT '17 17 20 22 24  '$ ALKPHOS 204* 172* 193* 182* 224*  BILITOT 0.6 0.7 1.1 0.7 0.7  PROT 6.2* 5.8* 6.6 5.8* 5.7*  ALBUMIN 2.5* 2.3* 2.7* 2.3* 2.4*   Recent Labs  Lab 09/30/21 1055  LIPASE 22   Recent Labs  Lab 10/02/21 0502  AMMONIA 49*   Coagulation Profile: Recent Labs  Lab 10/01/21 1058   INR 1.0   Cardiac Enzymes: No results for input(s): "CKTOTAL", "CKMB", "CKMBINDEX", "TROPONINI" in the last 168 hours. BNP (last 3 results) No results for input(s): "PROBNP" in the last 8760 hours. HbA1C: No results for input(s): "HGBA1C" in the last 72 hours. CBG: Recent Labs  Lab 10/01/21 1039  GLUCAP 86   Lipid Profile: No results for input(s): "CHOL", "HDL", "LDLCALC", "TRIG", "CHOLHDL", "LDLDIRECT" in the last 72 hours. Thyroid Function Tests: No results for input(s): "TSH", "T4TOTAL", "FREET4", "T3FREE", "THYROIDAB" in the last 72 hours. Anemia Panel: No results for input(s): "VITAMINB12", "FOLATE", "FERRITIN", "TIBC", "IRON", "RETICCTPCT" in the last 72 hours. Urine analysis:    Component Value Date/Time   COLORURINE YELLOW 09/30/2021 [place] 09/30/2021 1417  LABSPEC 1.020 09/30/2021 1417   PHURINE 6.0 09/30/2021 1417   GLUCOSEU NEGATIVE 09/30/2021 1417   HGBUR NEGATIVE 09/30/2021 1417   BILIRUBINUR NEGATIVE 09/30/2021 1417   KETONESUR NEGATIVE 09/30/2021 1417   PROTEINUR NEGATIVE 09/30/2021 1417   NITRITE NEGATIVE 09/30/2021 1417   LEUKOCYTESUR SMALL (A) 09/30/2021 1417   Sepsis Labs: '@LABRCNTIP'$ (procalcitonin:4,lacticidven:4)  ) Recent Results (from the past 240 hour(s))  Urine Culture     Status: None   Collection Time: 09/30/21  2:17 PM   Specimen: Urine, Clean Catch  Result Value Ref Range Status   Specimen Description   Final    URINE, CLEAN CATCH Performed at [facility], [place] [address] [place], [place] [address]    Special Requests   Final    NONE Performed at [facility], [place] [address] [place], [place] [address]    Culture   Final    NO GROWTH Performed at [facility], [place] [address] [place], [place] [address]    Report Status 10/01/2021 FINAL  Final      Studies: No results found.  Scheduled Meds:  sodium chloride   Intravenous Once   alum & mag hydroxide-simeth  30  mL Oral Once   feeding supplement  1 Container Oral TID BM   furosemide  20 mg Intravenous Once   furosemide  20 mg Intravenous Once   pantoprazole  40 mg Oral BID   sucralfate  1 g Oral TID WC & HS    Continuous Infusions:  dextrose 5% lactated ringers with KCl 20 mEq/L 50 mL/hr at 10/05/21 0831     LOS: 4 days     Kayleen Memos, MD [facility] Pager [phone]  If 7PM-7AM, please contact night-coverage [email or web address] Password [facility] 10/05/2021, 3:47 PM

## 2021-10-05 NOTE — Progress Notes (Signed)
Jane Kennedy continues to improve from a cognition point of view.  She looks like she is back to her baseline.  We now know the diagnosis is.  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  However, we do not know the subtype.  I need to know if this is clear-cell or sarcomatoid.  Now that she is better and is able to follow instructions, we really need Intervention Radiology to see her to see about doing kyphoplasty where she has the fracture.  We also need Radiation Oncology to see her to see about radiation to her back.  She is eating a little bit better.  She is having no problems with nausea or vomiting.  We can stop the lactulose.  We will stop the Xifaxan.  I do not think she has hepatic insufficiency at this point.  Today, her white cell count is 9.6.  Hemoglobin 11.1.  Platelet count 222,000.  I would probably repeat a ammonia level on her.  Her calcium is 8.  Her albumin is 2.3.  She really needs to start getting out of bed and sitting in the chair.  It would be helpful to get physical therapy involved.  I know that they evaluated her back on the eighth.  I think that she would be able to do much more now.  She has had no problems with cough.  She has had a indigestion.  I wonder if this might improve we stopped the lactulose.    Now that we know that this is renal cell carcinoma, we can certainly consider her for immunotherapy.  However, I think that getting the back fixed is the priority right now.  Again I think kyphoplasty from Interventional Radiology and then radiation therapy from Radiation Oncology would be the initial way to go.  On her physical exam, her vital signs are all stable.  Blood pressure is 135/67.  Temperature is 98.3.  Pulse is 103.  Her lungs are clear bilaterally.  Cardiac exam regular rate and rhythm.  Abdomen is soft.  Bowel sounds are present.  There is no guarding or rebound tenderness.  Extremity shows no clubbing, cyanosis or edema.  Neurological exam is  nonfocal.  I am just very happy that she is back to her baseline mental status.  Now, we have to keep working on the back to help with her pain so that this will be under better control.  I do appreciate the incredible care that she is getting from the staff on 5 E.  Lattie Haw, MD  [place] 15:26

## 2021-10-06 LAB — CBC WITH DIFFERENTIAL/PLATELET
Abs Immature Granulocytes: 0.09 10*3/uL — ABNORMAL HIGH (ref 0.00–0.07)
Basophils Absolute: 0 10*3/uL (ref 0.0–0.1)
Basophils Relative: 0 %
Eosinophils Absolute: 0.2 10*3/uL (ref 0.0–0.5)
Eosinophils Relative: 2 %
HCT: 35 % — ABNORMAL LOW (ref 36.0–46.0)
Hemoglobin: 10.9 g/dL — ABNORMAL LOW (ref 12.0–15.0)
Immature Granulocytes: 1 %
Lymphocytes Relative: 13 %
Lymphs Abs: 1.2 10*3/uL (ref 0.7–4.0)
MCH: 25.8 pg — ABNORMAL LOW (ref 26.0–34.0)
MCHC: 31.1 g/dL (ref 30.0–36.0)
MCV: 82.7 fL (ref 80.0–100.0)
Monocytes Absolute: 1.2 10*3/uL — ABNORMAL HIGH (ref 0.1–1.0)
Monocytes Relative: 12 %
Neutro Abs: 7.1 10*3/uL (ref 1.7–7.7)
Neutrophils Relative %: 72 %
Platelets: 230 10*3/uL (ref 150–400)
RBC: 4.23 MIL/uL (ref 3.87–5.11)
RDW: 17.3 % — ABNORMAL HIGH (ref 11.5–15.5)
WBC: 9.7 10*3/uL (ref 4.0–10.5)
nRBC: 0 % (ref 0.0–0.2)

## 2021-10-06 LAB — AMMONIA: Ammonia: 168 umol/L — ABNORMAL HIGH (ref 9–35)

## 2021-10-06 MED ORDER — LACTULOSE 10 GM/15ML PO SOLN
20.0000 g | Freq: Two times a day (BID) | ORAL | Status: DC
  Administered 2021-10-06 – 2021-10-07 (×4): 20 g via ORAL
  Filled 2021-10-06 (×4): qty 30

## 2021-10-06 NOTE — Progress Notes (Signed)
PROGRESS NOTE  Jane Kennedy RXV:[id number] DOB: 04/22/1946 DOA: 09/30/2021 PCP: Houston Siren., MD  HPI/Recap of past 81 hours: 76 year old  f w/ actinic keratosis, benign neoplasm of large intestine, carpal tunnel syndrome, DDD lumbar sacral, diverticulosis, GERD, hyperlipidemia, IBS, osteopenia, vertigo, vitamin D deficiency presented with nausea, vomiting, abdominal pain and elevated calcium level.  Evaluated outside facility was diagnosed with UTI, antibiotics prescribed.  She was not able to fill out prescription.  She was found to have potassium of 2.3, magnesium 1.7, phosphorus 2.6, calcium 12.1.  Patient admitted for further treatment of hypercalcemia, hypokalemia in the setting of malignancy. She has had worsening of confusion and delirium, agitated at times with tremors, was lethargic 6/6-CT head, ABG unrevealing. Suspect delirium multifactorial  related to acute illness, initial hypercalcemia, medications (receiving opioids, ativan), increase ammonia  level, started on lactulose.  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  Followed by Dr. Marin Olp.  10/06/2021: Patient was seen and examined at her bedside.  Her daughter was present in the room.  She is alert and confused.  Ammonia level 168 this morning from 49 on 10/02/21.    Assessment/Plan: Principal Problem:   Hypercalcemia Active Problems:   Gastroesophageal reflux disease with esophagitis   Hyperlipidemia, unspecified   Hypokalemia   Left kidney mass   Moderate protein malnutrition (HCC)   Acute metabolic encephalopathy   Fracture of L2 vertebra (HCC)   GERD (gastroesophageal reflux disease)   Iron deficiency anemia   Resolved hypercalcemia: Likely related to her malignancy in the setting of renal mass/lytic lesion on the bone.  Received Zometa and calcitonin, calcium improving> 8.9 this morning albumin 2.4.  Corrected calcium for albumin 10.2.    Metastatic renal cell  carcinoma She underwent liver biopsy on 10/02/2021 with findings consistent with metastatic renal cell carcinoma. Followed by Dr. Marin Olp  Hyperammonemia Ammonia level is uptrending 168 from 49, 4 days ago P.o. lactulose 20 g twice daily Goal 2-3 stools per day Repeat ammonia level in the morning   Resolved hypokalemia: Serum potassium 3.7.  Elevated liver chemistries Alkaline phosphatase, AST ALT uptrending. Repeat chemistry panel in the morning   Acute metabolic encephalopathy suspect secondary to hyper ammonemia:  Continue delirium and fall precautions Treat underlying conditions. Reorient as needed   GERD/indigestion:  Continue p.o. PPI twice daily. Continue supportive care as needed.  Hyperlipidemia: Home statin on hold due to elevated liver chemistries   Left kidney mass Diffuse liver mets, lytic bone metastasis involving L2 vertebra: Underwent liver biopsy 6/7 with findings as stated above,  IR consulted kyphoplasty evaluation.  Possible IR kyphoplasty on 10/07/2021 N.p.o. after midnight   Moderate protein malnutrition: Liberalize diet   Anemia iron deficiency IV iron, S/P 2 unit PRBC 6/7 monitor Hemoglobin 10.9 No overt bleeding reported   Body mass index is 23.03 kg/m.   DVT prophylaxis: SCDs Start: 09/30/21 1526.  Subcu Lovenox daily. Code Status:   Code Status: Full Code Family Communication: plan of care discussed with patient/BROTHER at bedside. Patient status is: Inpatient because of ongoing encephalopathy and work-up Level of care: Med-Surg    Dispo: The patient is from: home            Anticipated disposition: Likely discharge to home with home health services after kyphoplasty on Monday.   Mobility Assessment (last 72 hours)      Status is: Inpatient The patient requires at least 2 midnight for further evaluation and treatment of present condition.    Objective:  Vitals:   10/05/21 0507 10/05/21 1421 10/05/21 1949 10/06/21 0330  BP: 135/67  (!) 145/69 135/60 126/62  Pulse: (!) 103 (!) 106 (!) 101 100  Resp: '16 17 18 18  '$ Temp: 98.3 F (36.8 C) 99.2 F (37.3 C) 99.2 F (37.3 C) 97.9 F (36.6 C)  TempSrc: Oral Oral Oral Oral  SpO2: 94% 97% 96% 95%  Weight:      Height:        Intake/Output Summary (Last 24 hours) at 10/06/2021 1405 Last data filed at 10/06/2021 0330 Gross per 24 hour  Intake 841.7 ml  Output 400 ml  Net 441.7 ml   Filed Weights   09/30/21 1026  Weight: 59 kg    Exam:  General: 76 y.o. year-old female frail-appearing no acute distress.  She is alert and confused.   Cardiovascular: Regular rate and rhythm no rubs or gallops.   Respiratory: Clear to auscultation no wheezes or rales.   Abdomen: Soft normal bowel sounds present. Musculoskeletal: No lower extremity edema bilaterally. Skin: No ulcerative lesions noted.   Psychiatry: Mood is appropriate for condition and setting.   Data Reviewed: CBC: Recent Labs  Lab 10/02/21 0502 10/03/21 0552 10/04/21 0534 10/05/21 0620 10/06/21 0628  WBC 9.2 12.8* 10.5 9.6 9.7  NEUTROABS 7.5 10.3* 8.0* 6.9 7.1  HGB 8.0* 12.2 11.0* 11.1* 10.9*  HCT 27.3* 39.7 36.3 36.2 35.0*  MCV 80.1 83.9 84.8 83.6 82.7  PLT 283 291 247 222 786   Basic Metabolic Panel: Recent Labs  Lab 09/30/21 1055 10/01/21 0559 10/02/21 0502 10/02/21 1935 10/03/21 0552 10/04/21 0534 10/05/21 0620  NA 139   < > 142 141 140 140 140  K 2.3*   < > 2.8* 3.1* 3.1* 3.4* 3.7  CL 96*   < > 106 106 103 106 107  CO2 35*   < > '30 29 27 26 26  '$ GLUCOSE 109*   < > 133* 131* 118* 149* 123*  BUN 19   < > '12 10 11 9 '$ 7*  CREATININE 0.94   < > 0.69 0.70 0.59 0.61 0.57  CALCIUM 12.1*   < > 9.7 9.3 9.6 8.9 8.9  MG 1.7  --  2.2  --   --   --   --   PHOS 2.6  --   --   --   --   --   --    < > = values in this interval not displayed.   GFR: Estimated Creatinine Clearance: 49.5 mL/min (by C-G formula based on SCr of 0.57 mg/dL). Liver Function Tests: Recent Labs  Lab 10/01/21 0559  10/02/21 0502 10/03/21 0552 10/04/21 0534 10/05/21 0620  AST 40 42* 43* 42* 46*  ALT '17 17 20 22 24  '$ ALKPHOS 204* 172* 193* 182* 224*  BILITOT 0.6 0.7 1.1 0.7 0.7  PROT 6.2* 5.8* 6.6 5.8* 5.7*  ALBUMIN 2.5* 2.3* 2.7* 2.3* 2.4*   Recent Labs  Lab 09/30/21 1055  LIPASE 22   Recent Labs  Lab 10/02/21 0502 10/06/21 0628  AMMONIA 49* 168*   Coagulation Profile: Recent Labs  Lab 10/01/21 1058  INR 1.0   Cardiac Enzymes: No results for input(s): "CKTOTAL", "CKMB", "CKMBINDEX", "TROPONINI" in the last 168 hours. BNP (last 3 results) No results for input(s): "PROBNP" in the last 8760 hours. HbA1C: No results for input(s): "HGBA1C" in the last 72 hours. CBG: Recent Labs  Lab 10/01/21 1039  GLUCAP 86   Lipid Profile: No results for input(s): "CHOL", "HDL", "LDLCALC", "  TRIG", "CHOLHDL", "LDLDIRECT" in the last 72 hours. Thyroid Function Tests: No results for input(s): "TSH", "T4TOTAL", "FREET4", "T3FREE", "THYROIDAB" in the last 72 hours. Anemia Panel: No results for input(s): "VITAMINB12", "FOLATE", "FERRITIN", "TIBC", "IRON", "RETICCTPCT" in the last 72 hours. Urine analysis:    Component Value Date/Time   COLORURINE YELLOW 09/30/2021 [place] 09/30/2021 1417   LABSPEC 1.020 09/30/2021 1417   PHURINE 6.0 09/30/2021 1417   GLUCOSEU NEGATIVE 09/30/2021 1417   HGBUR NEGATIVE 09/30/2021 1417   BILIRUBINUR NEGATIVE 09/30/2021 1417   KETONESUR NEGATIVE 09/30/2021 1417   PROTEINUR NEGATIVE 09/30/2021 1417   NITRITE NEGATIVE 09/30/2021 1417   LEUKOCYTESUR SMALL (A) 09/30/2021 1417   Sepsis Labs: '@LABRCNTIP'$ (procalcitonin:4,lacticidven:4)  ) Recent Results (from the past 240 hour(s))  Urine Culture     Status: None   Collection Time: 09/30/21  2:17 PM   Specimen: Urine, Clean Catch  Result Value Ref Range Status   Specimen Description   Final    URINE, CLEAN CATCH Performed at [facility], [place] [address], [place], [place]  [address]    Special Requests   Final    NONE Performed at [facility], [place] [address] [place], [place] [address]    Culture   Final    NO GROWTH Performed at [facility], [place] [address], [place], [place] [address]    Report Status 10/01/2021 FINAL  Final      Studies: No results found.  Scheduled Meds:  enoxaparin (LOVENOX) injection  40 mg Subcutaneous Q24H   feeding supplement  1 Container Oral TID BM   lactulose  20 g Oral BID   pantoprazole  40 mg Oral BID   sucralfate  1 g Oral TID WC & HS    Continuous Infusions:  dextrose 5% lactated ringers with KCl 20 mEq/L 50 mL/hr at 10/06/21 0454     LOS: 5 days     Kayleen Memos, MD [facility] Pager [phone]  If 7PM-7AM, please contact night-coverage [email or web address] Password [facility] 10/06/2021, 2:05 PM

## 2021-10-06 NOTE — Plan of Care (Signed)

## 2021-10-07 LAB — COMPREHENSIVE METABOLIC PANEL
ALT: 20 U/L (ref 0–44)
AST: 41 U/L (ref 15–41)
Albumin: 2.2 g/dL — ABNORMAL LOW (ref 3.5–5.0)
Alkaline Phosphatase: 230 U/L — ABNORMAL HIGH (ref 38–126)
Anion gap: 6 (ref 5–15)
BUN: <5 mg/dL — ABNORMAL LOW (ref 8–23)
CO2: 26 mmol/L (ref 22–32)
Calcium: 8.6 mg/dL — ABNORMAL LOW (ref 8.9–10.3)
Chloride: 107 mmol/L (ref 98–111)
Creatinine, Ser: 0.51 mg/dL (ref 0.44–1.00)
GFR, Estimated: >60 mL/min (ref >60)
Glucose, Bld: 121 mg/dL — ABNORMAL HIGH (ref 70–99)
Potassium: 3.6 mmol/L (ref 3.5–5.1)
Sodium: 139 mmol/L (ref 135–145)
Total Bilirubin: 0.5 mg/dL (ref 0.3–1.2)
Total Protein: 5.7 g/dL — ABNORMAL LOW (ref 6.5–8.1)

## 2021-10-07 LAB — AMMONIA: Ammonia: 36 umol/L — ABNORMAL HIGH (ref 9–35)

## 2021-10-07 LAB — CBC WITH DIFFERENTIAL/PLATELET
Abs Immature Granulocytes: 0.11 10*3/uL — ABNORMAL HIGH (ref 0.00–0.07)
Basophils Absolute: 0 10*3/uL (ref 0.0–0.1)
Basophils Relative: 0 %
Eosinophils Absolute: 0.2 10*3/uL (ref 0.0–0.5)
Eosinophils Relative: 2 %
HCT: 34.5 % — ABNORMAL LOW (ref 36.0–46.0)
Hemoglobin: 10.8 g/dL — ABNORMAL LOW (ref 12.0–15.0)
Immature Granulocytes: 1 %
Lymphocytes Relative: 13 %
Lymphs Abs: 1.3 10*3/uL (ref 0.7–4.0)
MCH: 25.9 pg — ABNORMAL LOW (ref 26.0–34.0)
MCHC: 31.3 g/dL (ref 30.0–36.0)
MCV: 82.7 fL (ref 80.0–100.0)
Monocytes Absolute: 1.1 10*3/uL — ABNORMAL HIGH (ref 0.1–1.0)
Monocytes Relative: 12 %
Neutro Abs: 6.8 10*3/uL (ref 1.7–7.7)
Neutrophils Relative %: 72 %
Platelets: 212 10*3/uL (ref 150–400)
RBC: 4.17 MIL/uL (ref 3.87–5.11)
RDW: 17.5 % — ABNORMAL HIGH (ref 11.5–15.5)
WBC: 9.4 10*3/uL (ref 4.0–10.5)
nRBC: 0 % (ref 0.0–0.2)

## 2021-10-07 LAB — SURGICAL PATHOLOGY

## 2021-10-07 MED ORDER — ALUM & MAG HYDROXIDE-SIMETH 200-200-20 MG/5ML PO SUSP
30.0000 mL | ORAL | Status: DC | PRN
  Administered 2021-10-07 – 2021-10-13 (×4): 30 mL via ORAL
  Filled 2021-10-07 (×4): qty 30

## 2021-10-07 NOTE — TOC Progression Note (Signed)
Transition of Care [facility]) - Progression Note    Patient Details  Name: Sherryann Frese MRN: [id number] Date of Birth: 05-Jun-1945  Transition of Care [facility]) CM/SW [place], LCSW Phone Number: 10/07/2021, 9:11 AM  Clinical Narrative:    CSW confirmed with [facility] that they are following this pt and will provide HHPT for this pt once medically stable to return home. CSW will continue to follow for additional needs/recommendations that may arise.    Expected Discharge Plan: [place] Barriers to Discharge: Continued Medical Work up  Expected Discharge Plan and Services Expected Discharge Plan: [place] In-house Referral: Clinical Social Work Discharge Planning Services: CM Consult Post Acute Care Choice: [place] arrangements for the past 2 months: Single Family Home                 DME Arranged: [place] rolling DME Agency: [employer] Date DME Agency Contacted: 10/04/21 Time DME Agency Contacted: [id number] Representative spoke with at DME Agency: [place]: PT [place]: [place] Date [place]: 10/04/21 Time [place]: 1300 Representative spoke with at [place]: Levada Dy   Social Determinants of Health ([place]) Interventions    Readmission Risk Interventions     No data to display

## 2021-10-07 NOTE — Progress Notes (Signed)
PROGRESS NOTE  Jane Kennedy ZOX:[id number] DOB: [birth year] DOA: 09/30/2021 PCP: Houston Siren., MD  HPI/Recap of past 62 hours: 76 year old  f w/ actinic keratosis, benign neoplasm of large intestine, carpal tunnel syndrome, DDD lumbar sacral, diverticulosis, GERD, hyperlipidemia, IBS, osteopenia, vertigo, vitamin D deficiency presented with nausea, vomiting, abdominal pain and elevated calcium level.  Evaluated outside facility was diagnosed with UTI, antibiotics prescribed.  She was not able to fill out prescription.  She was found to have potassium of 2.3, magnesium 1.7, phosphorus 2.6, calcium 12.1.  Patient admitted for further treatment of hypercalcemia, hypokalemia in the setting of malignancy. She has had worsening of confusion and delirium, agitated at times with tremors, was lethargic 6/6-CT head, ABG unrevealing. Suspect delirium multifactorial  related to acute illness, initial hypercalcemia, medications (receiving opioids, ativan), increase ammonia  level, started on lactulose.  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  Followed by Dr. Marin Olp.  10/07/2021: Kyphoplasty delayed, pending insurance authorization.  Assessment/Plan: Principal Problem:   Hypercalcemia Active Problems:   Gastroesophageal reflux disease with esophagitis   Hyperlipidemia, unspecified   Hypokalemia   Left kidney mass   Moderate protein malnutrition (HCC)   Acute metabolic encephalopathy   Fracture of L2 vertebra (HCC)   GERD (gastroesophageal reflux disease)   Iron deficiency anemia   Resolved hypercalcemia: Likely related to her malignancy in the setting of renal mass/lytic lesion on the bone.  Received Zometa and calcitonin, calcium improving> 8.9 this morning albumin 2.4.  Corrected calcium for albumin 10.2.    Metastatic renal cell carcinoma She underwent liver biopsy on 10/02/2021 with findings consistent with metastatic renal cell  carcinoma. Followed by Dr. Marin Olp  Pathologic fracture L2 vertebrae Plan for kyphoplasty Pending insurance authorization Pain management and bowel regimen  Resolved hyperammonemia Ammonia level is normalized 36 from 168 from 49. P.o. lactulose 20 g twice daily Goal 2-3 stools per day   Resolved hypokalemia: Serum potassium 3.7.  Elevated liver chemistries Alkaline phosphatase, AST ALT uptrending. Repeat chemistry panel in the morning   Acute metabolic encephalopathy suspect secondary to hyper ammonemia:  Continue delirium and fall precautions Treat underlying conditions. Reorient as needed   GERD/indigestion:  Continue p.o. PPI twice daily. Continue supportive care as needed.  Hyperlipidemia: Home statin on hold due to elevated liver chemistries   Left kidney mass Diffuse liver mets, lytic bone metastasis involving L2 vertebra: Underwent liver biopsy 6/7 with findings as stated above,  IR consulted kyphoplasty evaluation.  Possible IR kyphoplasty on 10/08/2021 Pending insurance authorization.   Moderate protein malnutrition: Liberalize diet   Anemia iron deficiency IV iron, S/P 2 unit PRBC 6/7 monitor Hemoglobin 10.9 No overt bleeding reported   Body mass index is 23.03 kg/m.   DVT prophylaxis: SCDs Start: 09/30/21 1526.  Subcu Lovenox daily. Code Status:   Code Status: Full Code Family Communication: plan of care discussed with patient/BROTHER at bedside. Patient status is: Inpatient because of ongoing encephalopathy and work-up Level of care: Med-Surg    Dispo: The patient is from: home            Anticipated disposition: Likely discharge to home with home health services after kyphoplasty on Monday.   Mobility Assessment (last 72 hours)      Status is: Inpatient The patient requires at least 2 midnight for further evaluation and treatment of present condition.    Objective: Vitals:   10/06/21 1553 10/06/21 1930 10/07/21 0303 10/07/21 1346  BP:  131/66 (!) 136/59 132/61 139/65  Pulse: (!) 107 (!) 102 99 99  Resp: '18 18 17 20  '$ Temp: 98.5 F (36.9 C) 98 F (36.7 C) 99 F (37.2 C) 97.9 F (36.6 C)  TempSrc: Oral Oral Oral Oral  SpO2: 96% 96% 90% 99%  Weight:      Height:        Intake/Output Summary (Last 24 hours) at 10/07/2021 1350 Last data filed at 10/07/2021 1300 Gross per 24 hour  Intake 360 ml  Output 650 ml  Net -290 ml   Filed Weights   09/30/21 1026  Weight: 59 kg    Exam:  General: 76 y.o. year-old female frail-appearing no acute distress.  She is alert and confused.   Cardiovascular: Regular rate and rhythm no rubs or gallops.   Respiratory: Clear to auscultation no wheezes or rales.   Abdomen: Soft normal bowel sounds present. Musculoskeletal: No lower extremity edema bilaterally. Skin: No ulcerative lesions noted.   Psychiatry: Mood is appropriate for condition and setting.   Data Reviewed: CBC: Recent Labs  Lab 10/03/21 0552 10/04/21 0534 10/05/21 0620 10/06/21 0628 10/07/21 0508  WBC 12.8* 10.5 9.6 9.7 9.4  NEUTROABS 10.3* 8.0* 6.9 7.1 6.8  HGB 12.2 11.0* 11.1* 10.9* 10.8*  HCT 39.7 36.3 36.2 35.0* 34.5*  MCV 83.9 84.8 83.6 82.7 82.7  PLT 291 247 222 230 092   Basic Metabolic Panel: Recent Labs  Lab 10/02/21 0502 10/02/21 1935 10/03/21 0552 10/04/21 0534 10/05/21 0620 10/07/21 0729  NA 142 141 140 140 140 139  K 2.8* 3.1* 3.1* 3.4* 3.7 3.6  CL 106 106 103 106 107 107  CO2 '30 29 27 26 26 26  '$ GLUCOSE 133* 131* 118* 149* 123* 121*  BUN '12 10 11 9 '$ 7* <5*  CREATININE 0.69 0.70 0.59 0.61 0.57 0.51  CALCIUM 9.7 9.3 9.6 8.9 8.9 8.6*  MG 2.2  --   --   --   --   --    GFR: Estimated Creatinine Clearance: 49.5 mL/min (by C-G formula based on SCr of 0.51 mg/dL). Liver Function Tests: Recent Labs  Lab 10/02/21 0502 10/03/21 0552 10/04/21 0534 10/05/21 0620 10/07/21 0729  AST 42* 43* 42* 46* 41  ALT '17 20 22 24 20  '$ ALKPHOS 172* 193* 182* 224* 230*  BILITOT 0.7 1.1 0.7 0.7 0.5   PROT 5.8* 6.6 5.8* 5.7* 5.7*  ALBUMIN 2.3* 2.7* 2.3* 2.4* 2.2*   No results for input(s): "LIPASE", "AMYLASE" in the last 168 hours.  Recent Labs  Lab 10/02/21 0502 10/06/21 0628 10/07/21 0729  AMMONIA 49* 168* 36*   Coagulation Profile: Recent Labs  Lab 10/01/21 1058  INR 1.0   Cardiac Enzymes: No results for input(s): "CKTOTAL", "CKMB", "CKMBINDEX", "TROPONINI" in the last 168 hours. BNP (last 3 results) No results for input(s): "PROBNP" in the last 8760 hours. HbA1C: No results for input(s): "HGBA1C" in the last 72 hours. CBG: Recent Labs  Lab 10/01/21 1039  GLUCAP 86   Lipid Profile: No results for input(s): "CHOL", "HDL", "LDLCALC", "TRIG", "CHOLHDL", "LDLDIRECT" in the last 72 hours. Thyroid Function Tests: No results for input(s): "TSH", "T4TOTAL", "FREET4", "T3FREE", "THYROIDAB" in the last 72 hours. Anemia Panel: No results for input(s): "VITAMINB12", "FOLATE", "FERRITIN", "TIBC", "IRON", "RETICCTPCT" in the last 72 hours. Urine analysis:    Component Value Date/Time   COLORURINE YELLOW 09/30/2021 [place] 09/30/2021 1417   LABSPEC 1.020 09/30/2021 1417   PHURINE 6.0 09/30/2021 1417   GLUCOSEU NEGATIVE 09/30/2021 1417   HGBUR NEGATIVE  09/30/2021 [place] 09/30/2021 1417   KETONESUR NEGATIVE 09/30/2021 1417   PROTEINUR NEGATIVE 09/30/2021 1417   NITRITE NEGATIVE 09/30/2021 1417   LEUKOCYTESUR SMALL (A) 09/30/2021 1417   Sepsis Labs: '@LABRCNTIP'$ (procalcitonin:4,lacticidven:4)  ) Recent Results (from the past 240 hour(s))  Urine Culture     Status: None   Collection Time: 09/30/21  2:17 PM   Specimen: Urine, Clean Catch  Result Value Ref Range Status   Specimen Description   Final    URINE, CLEAN CATCH Performed at [facility], [place] [address], [place], [place] [address]    Special Requests   Final    NONE Performed at [facility], [place] [address] [place], [place]  [address]    Culture   Final    NO GROWTH Performed at [facility], [place] [address] [place], [place] [address]    Report Status 10/01/2021 FINAL  Final      Studies: No results found.  Scheduled Meds:  enoxaparin (LOVENOX) injection  40 mg Subcutaneous Q24H   feeding supplement  1 Container Oral TID BM   lactulose  20 g Oral BID   pantoprazole  40 mg Oral BID   sucralfate  1 g Oral TID WC & HS    Continuous Infusions:  dextrose 5% lactated ringers with KCl 20 mEq/L 50 mL/hr at 10/07/21 1334     LOS: 6 days     Kayleen Memos, MD [facility] Pager [phone]  If 7PM-7AM, please contact night-coverage [email or web address] Password [email or web address] 10/07/2021, 1:50 PM

## 2021-10-07 NOTE — Progress Notes (Signed)
Jane Kennedy seems to be doing pretty well this morning.  She is n.p.o.  I am not sure what procedure she is going to have done today.  She is alert and oriented.  She is back to her baseline which is nice to see.  She does have metastatic renal cell carcinoma.  I still do not know if this is clear-cell or sarcomatoid.  We will have to await the final pathology on this.  She will need to have radiation therapy to her back.  She will be a candidate for immunotherapy.  This can be done as an outpatient.  I find it hard to believe that her ammonia was 168 yesterday.  That really makes no sense to me.  She has no evidence of any encephalopathy.  Today, her white cell count is 9.4.  Hemoglobin 10.8.  Platelet count is 212,000.  She seems to be eating a little bit better.  She has had no nausea or vomiting.  She does not have as much in the way of indigestion.  I think the lactulose that she was on was causing this.  Her vital signs show temperature of 99.  Pulse 99.  Blood pressure 132/61.  Her lungs sound clear bilaterally.  Cardiac exam regular rate and rhythm.  Abdomen is soft.  Bowel sounds are present.  Bowel sounds might be slightly decreased.  There is no guarding or rebound tenderness.  She has no palpable liver or spleen tip.  Extremity shows no clubbing, cyanosis or edema.  Neurological exam shows no focal neurological deficits.  Again, she is n.p.o.  I am not sure which procedure she can have done today.  Maybe it would be a kyphoplasty.  Again she will need Radiation Oncology to see her for radiation therapy to her back.  I appreciate the incredible care that she is getting from all the staff down on [address].   Jane Haw, MD  Jane Kennedy 1:19

## 2021-10-07 NOTE — Care Management Important Message (Signed)
Important Message  Patient Details IM Letter given to the Patient. Name: Jane Kennedy MRN: [id number] Date of Birth: 1945/07/16   Medicare Important Message Given:  Yes     Kerin Salen 10/07/2021, 10:19 AM

## 2021-10-07 NOTE — Progress Notes (Signed)
IR notified this nurse of procedure being cancelled for today. MD made aware.

## 2021-10-08 ENCOUNTER — Encounter (HOSPITAL_COMMUNITY): Payer: Self-pay | Admitting: Internal Medicine

## 2021-10-08 ENCOUNTER — Ambulatory Visit
Admit: 2021-10-08 | Discharge: 2021-10-08 | Disposition: A | Discharge status: Home / Self Care | Payer: Medicare HMO | Attending: Radiation Oncology | Admitting: Radiation Oncology

## 2021-10-08 DIAGNOSIS — C649 Malignant neoplasm of unspecified kidney, except renal pelvis: Secondary | ICD-10-CM | POA: Insufficient documentation

## 2021-10-08 LAB — CBC WITH DIFFERENTIAL/PLATELET
Abs Immature Granulocytes: 0.1 10*3/uL — ABNORMAL HIGH (ref 0.00–0.07)
Basophils Absolute: 0 10*3/uL (ref 0.0–0.1)
Basophils Relative: 0 %
Eosinophils Absolute: 0.2 10*3/uL (ref 0.0–0.5)
Eosinophils Relative: 2 %
HCT: 34.8 % — ABNORMAL LOW (ref 36.0–46.0)
Hemoglobin: 10.8 g/dL — ABNORMAL LOW (ref 12.0–15.0)
Immature Granulocytes: 1 %
Lymphocytes Relative: 15 %
Lymphs Abs: 1.5 10*3/uL (ref 0.7–4.0)
MCH: 25.8 pg — ABNORMAL LOW (ref 26.0–34.0)
MCHC: 31 g/dL (ref 30.0–36.0)
MCV: 83.3 fL (ref 80.0–100.0)
Monocytes Absolute: 1.1 10*3/uL — ABNORMAL HIGH (ref 0.1–1.0)
Monocytes Relative: 11 %
Neutro Abs: 6.8 10*3/uL (ref 1.7–7.7)
Neutrophils Relative %: 71 %
Platelets: 227 10*3/uL (ref 150–400)
RBC: 4.18 MIL/uL (ref 3.87–5.11)
RDW: 17.5 % — ABNORMAL HIGH (ref 11.5–15.5)
WBC: 9.6 10*3/uL (ref 4.0–10.5)
nRBC: 0 % (ref 0.0–0.2)

## 2021-10-08 LAB — ERYTHROPOIETIN: Erythropoietin: 39.9 m[IU]/mL — ABNORMAL HIGH (ref 2.6–18.5)

## 2021-10-08 MED ORDER — SENNOSIDES-DOCUSATE SODIUM 8.6-50 MG PO TABS
2.0000 | ORAL_TABLET | Freq: Every day | ORAL | Status: DC
  Administered 2021-10-08 – 2021-10-15 (×7): 2 via ORAL
  Filled 2021-10-08 (×7): qty 2

## 2021-10-08 MED ORDER — POLYETHYLENE GLYCOL 3350 17 G PO PACK
17.0000 g | PACK | Freq: Every day | ORAL | Status: DC
  Administered 2021-10-10 – 2021-10-16 (×5): 17 g via ORAL
  Filled 2021-10-08 (×5): qty 1

## 2021-10-08 MED ORDER — ADULT MULTIVITAMIN W/MINERALS CH
1.0000 | ORAL_TABLET | Freq: Every day | ORAL | Status: DC
  Administered 2021-10-12 – 2021-10-18 (×7): 1 via ORAL
  Filled 2021-10-08 (×8): qty 1

## 2021-10-08 MED ORDER — MORPHINE SULFATE 15 MG PO TABS
15.0000 mg | ORAL_TABLET | ORAL | Status: DC | PRN
  Administered 2021-10-10 – 2021-10-15 (×9): 15 mg via ORAL
  Filled 2021-10-08 (×9): qty 1

## 2021-10-08 MED ORDER — OXYCODONE HCL 5 MG PO TABS
5.0000 mg | ORAL_TABLET | Freq: Four times a day (QID) | ORAL | Status: AC | PRN
  Administered 2021-10-09 (×2): 5 mg via ORAL
  Filled 2021-10-08 (×2): qty 1

## 2021-10-08 MED ORDER — FENTANYL 25 MCG/HR TD PT72
1.0000 | MEDICATED_PATCH | TRANSDERMAL | Status: DC
  Administered 2021-10-08: 1 via TRANSDERMAL
  Filled 2021-10-08: qty 1

## 2021-10-08 MED ORDER — OXYCODONE HCL 5 MG PO TABS: 5.0000 mg | ORAL_TABLET | Freq: Four times a day (QID) | ORAL | Status: DC | PRN

## 2021-10-08 MED ORDER — POLYETHYLENE GLYCOL 3350 17 G PO PACK: 17.0000 g | PACK | Freq: Every day | ORAL | Status: DC | PRN

## 2021-10-08 NOTE — Progress Notes (Signed)
Jane Kennedy is doing okay.  She is little bit frustrated with not be able to do what she would like.  She was supposed to have, I thought, the vertebroplasty yesterday.  I do not know if there are any issues with respect to insurance covering this.  Regardless, she clearly needs radiation therapy to the back.  Hopefully something can be done soon.  She is going need to be on a pain regimen now.  We will have to see about getting her on a Duragesic patch.  Hopefully, she will not have problems with respect to changes in mental status.  She certainly might be sensitive to opioids.  Her calcium is still doing okay.  Yesterday, her calcium was 8.6 with an albumin of 2.2.  Her ammonia was only 36.  Again, I doubt that it was 168 on the 11th.  This morning, her white count was 9.6.  Hemoglobin 10.8.  Platelet count 227,000.  Again we will have to watch her mental status with respect to initiating opioids.  Maybe, the Duragesic patch might help her out.  This hopefully will put medications in her system in a low steady state.  I think she is eating okay.  I see that her albumin is not all that great.  She is going need to have a Port-A-Cath placed.  This is to give her immunotherapy we will restart systemic therapy for her metastatic kidney cancer.  I did speak with Dr. Sondra Come of Radiation Oncology yesterday.  Hopefully, they will be able to get her in for radiation to her back.  Her vital signs are all pretty stable.  Temperature 98.2.  Pulse 95.  Blood pressure 137/60.  Her lungs are clear.  Cardiac exam regular rate and rhythm.  Abdomen is soft.  Bowel sounds are present.  Extremity shows no clubbing, cyanosis or edema.  Neurological exam is nonfocal.  Jane Kennedy has the metastatic kidney cancer.  She needs to have the vertebroplasty to her back.  Again I am not sure what the delay in this has been although there might be some suggestion that insurance could be a problem.  I do appreciate the  incredible care that she is getting.  I know the staff on [address]. I done a tremendous job.  Lattie Haw, MD  Psalms 6:2

## 2021-10-08 NOTE — H&P (Signed)
Chief Complaint: Patient as seen in consultation today for  pathologic L2 vertebral body fracture at the request of Burney Gauze, MD Referring Physician(s): Burney Gauze, MD  Supervising Physician: Michaelle Birks  Patient Status: [facility] - In-pt  History of Present Illness: Jane Kennedy is a 76 y.o. female with PMH significant for metastatic renal cell carcinoma with bone and liver metastasis.  Patient is well-known to IR service having liver lesion biopsy 10/01/2021 with findings consistent with metastatic renal cell carcinoma.  Patient has been hospitalized since 09/30/2021 acute illness to include hypokalemia, hypercalcemia, hyperammonemia, confusion and delirium.  Patient was also found to have pathologic L2 vertebral fracture.  Dr. Marin Olp has placed request for tunneled catheter with port for patient to begin immunotherapy and kyphoplasty of L2.  Port placement tentatively scheduled for 10/08/2021. Patient is scheduled for kyphoplasty of L2 vertebral body with Osteocool 10/11/2021 with Dr. Maryelizabeth Kaufmann, IR.     Past Medical History:  Diagnosis Date   Cancer ([place])    Hyperlipidemia    Vertigo    Vitamin D deficiency 06/09/2015    Past Surgical History:  Procedure Laterality Date   CARPAL TUNNEL RELEASE Left    REPLACEMENT TOTAL HIP W/  RESURFACING IMPLANTS Bilateral     Allergies: Codeine and Nickel  Medications: Prior to Admission medications   Medication Sig Start Date End Date Taking? Authorizing Provider  acetaminophen (TYLENOL) 325 MG tablet Take 325 mg by mouth once. pain   Yes [provider]  amitriptyline (ELAVIL) 100 MG tablet Take 100 mg by mouth daily.   Yes [provider]  atorvastatin (LIPITOR) 40 MG tablet Take 40 mg by mouth daily. 09/18/21  Yes [provider]  Calcium Carbonate Antacid (TUMS PO) Take 2 tablets by mouth daily as needed (acid reflux).   Yes [provider]  ibuprofen (ADVIL) 200 MG tablet Take 200-400 mg by mouth 2  (two) times daily as needed for moderate pain.   Yes [provider]  lidocaine (XYLOCAINE) 5 % ointment Apply 1 application. topically 2 (two) times daily as needed (back pain).   Yes [provider]  omeprazole (PRILOSEC) 20 MG capsule Take 20 mg by mouth daily. 07/01/21  Yes [provider]  ondansetron (ZOFRAN) 8 MG tablet Take 1 tablet (8 mg total) by mouth every 8 (eight) hours as needed for nausea or vomiting. 09/20/21  Yes Ennever, Rudell Cobb, MD  cephALEXin (KEFLEX) 500 MG capsule Take 500 mg by mouth 2 (two) times daily. Patient not taking: Reported on 09/30/2021 09/29/21   [provider]  HYDROcodone-acetaminophen (NORCO/VICODIN) 5-325 MG tablet Take 1-2 tablets by mouth every 4 (four) hours as needed for moderate pain. Max 4-6 tabs a day Patient not taking: Reported on 09/30/2021 09/17/21   Volanda Napoleon, MD  Ibuprofen-diphenhydrAMINE HCl (IBUPROFEN PM) 200-25 MG CAPS Take 1 tablet by mouth at bedtime as needed (sleep). Patient not taking: Reported on 09/30/2021    [provider]  ondansetron (ZOFRAN-ODT) 4 MG disintegrating tablet Take by mouth. Patient not taking: Reported on 09/30/2021 09/29/21   [provider]  traMADol Veatrice Bourbon) 50 MG tablet  09/29/21   [provider]     Family History  Problem Relation Age of Onset   Migraines Mother    Multiple myeloma Mother    Diabetes Mellitus II Father    Stroke Maternal Grandfather    Colon cancer Maternal Aunt    Breast cancer Maternal Aunt     Social History  Socioeconomic History   Marital status: Widowed    Spouse name: Not on file   Number of children: Not on file   Years of education: Not on file   Highest education level: Not on file  Occupational History   Not on file  Tobacco Use   Smoking status: Former    Packs/day: 1.00    Types: Cigarettes    Quit date: 2018    Years since quitting: 5.4   Smokeless tobacco: Never  Vaping Use   Vaping Use: Never used   Substance and Sexual Activity   Alcohol use: Never   Drug use: Never   Sexual activity: Not on file  Other Topics Concern   Not on file  Social History Narrative   Not on file   Social Determinants of Health   Financial Resource Strain: Not on file  Food Insecurity: Not on file  Transportation Needs: Not on file  Physical Activity: Not on file  Stress: Not on file  Social Connections: Not on file     Review of Systems: A 12 point ROS discussed and pertinent positives are indicated in the HPI above.  All other systems are negative.  Review of Systems  Constitutional:  Positive for appetite change and fatigue. Negative for chills and fever.  Respiratory:  Negative for shortness of breath.   Cardiovascular:  Negative for chest pain and leg swelling.  Gastrointestinal:  Positive for nausea and vomiting. Negative for abdominal pain.  Musculoskeletal:  Positive for back pain.  Neurological:  Positive for weakness. Negative for dizziness and headaches.    Vital Signs: BP 137/60 (BP Location: Left Arm)   Pulse 95   Temp 98.2 F (36.8 C) (Oral)   Resp 18   Ht '5\' 3"'  (1.6 m)   Wt 130 lb (59 kg)   SpO2 99%   BMI 23.03 kg/m   Physical Exam Vitals reviewed.  Constitutional:      General: She is not in acute distress.    Appearance: Normal appearance. She is ill-appearing.  HENT:     Head: Normocephalic and atraumatic.     Mouth/Throat:     Mouth: Mucous membranes are moist.     Pharynx: Oropharynx is clear.  Eyes:     Extraocular Movements: Extraocular movements intact.     Pupils: Pupils are equal, round, and reactive to light.  Cardiovascular:     Rate and Rhythm: Regular rhythm. Tachycardia present.     Pulses: Normal pulses.     Heart sounds: Normal heart sounds.  Pulmonary:     Effort: Pulmonary effort is normal. No respiratory distress.     Breath sounds: Normal breath sounds.  Abdominal:     General: Bowel sounds are normal. There is no distension.      Palpations: Abdomen is soft.     Tenderness: There is no abdominal tenderness. There is no guarding.  Musculoskeletal:     Right lower leg: No edema.     Left lower leg: No edema.  Skin:    General: Skin is warm and dry.  Neurological:     Mental Status: She is alert. She is disoriented.  Psychiatric:        Mood and Affect: Mood normal.        Behavior: Behavior normal.     Imaging: US BIOPSY (LIVER)  Result Date: 10/02/2021 INDICATION: Imaging findings consistent with metastatic renal cell carcinoma. Liver metastases. EXAM: ULTRASOUND LEFT LIVER METASTASIS 18 GAUGE CORE BIOPSY MEDICATIONS: 1% LIDOCAINE LOCAL  ANESTHESIA/SEDATION: Moderate (conscious) sedation was employed during this procedure. A total of Versed 1.0 mg and Fentanyl 25 mcg was administered intravenously by the radiology nurse. Total intra-service moderate Sedation Time: 11 minutes. The patient's level of consciousness and vital signs were monitored continuously by radiology nursing throughout the procedure under my direct supervision. COMPLICATIONS: None immediate. PROCEDURE: Informed written consent was obtained from the patient after a thorough discussion of the procedural risks, benefits and alternatives. All questions were addressed. Maximal Sterile Barrier Technique was utilized including caps, mask, sterile gowns, sterile gloves, sterile drape, hand hygiene and skin antiseptic. A timeout was performed prior to the initiation of the procedure. previous imaging reviewed. preliminary ultrasound performed. solid hyperechoic liver lesions demonstrated correlating with the CT. a left hepatic lesion was localized and marked in the subxiphoid area for biopsy. Under sterile conditions and local anesthesia, ultrasound guidance utilized to advance a 17 gauge coaxial guide needle into a left hepatic lesion. Needle position confirmed with ultrasound. Through the access, 18 gauge core biopsies obtained. Samples were intact and non  fragmented. Images obtained for documentation. These were placed in formalin. Needle tract occluded with Gel-Foam. Postprocedure imaging demonstrates no hemorrhage or hematoma. Patient tolerated the biopsy well. Of note, left renal vein and IVC thrombus noted correlating with the tumor thrombus by CT. IMPRESSION: Successful ultrasound left hepatic metastasis 18 gauge core biopsy. Electronically Signed   By: Jerilynn Mages.  Shick M.D.   On: 10/02/2021 14:46   CT HEAD WO CONTRAST (5MM)  Result Date: 10/01/2021 CLINICAL DATA:  Altered mental status EXAM: CT HEAD WITHOUT CONTRAST TECHNIQUE: Contiguous axial images were obtained from the base of the skull through the vertex without intravenous contrast. RADIATION DOSE REDUCTION: This exam was performed according to the departmental dose-optimization program which includes automated exposure control, adjustment of the mA and/or kV according to patient size and/or use of iterative reconstruction technique. COMPARISON:  Correlation made with MRI May 2023 FINDINGS: Brain: There is no acute intracranial hemorrhage, mass effect, or edema. Gray-white differentiation is preserved. There is no extra-axial fluid collection. Prominence of the ventricles and sulci reflects mild parenchymal volume loss. Patchy hypoattenuation in the supratentorial white matter is nonspecific but may reflect mild chronic microvascular ischemic changes. Vascular: There is mild atherosclerotic calcification at the skull base. Skull: Calvarium is unremarkable. Sinuses/Orbits: No acute finding. Other: Mastoid air cells are clear. There is no right temporalis abnormality on CT corresponding to MR finding. 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 IMPRESSION: No acute intracranial abnormality. Electronically Signed   By: Macy Mis M.D.   On: 10/01/2021 12:55   MR Brain W Wo Contrast  Result Date: 09/24/2021 CLINICAL DATA:  Renal mass, right N28.89 (ICD-10-CM). Staging for metastatic  kidney cancer. EXAM: MRI HEAD WITHOUT AND WITH CONTRAST TECHNIQUE: Multiplanar, multiecho pulse sequences of the brain and surrounding structures were obtained without and with intravenous contrast. CONTRAST:  44m GADAVIST GADOBUTROL 1 MMOL/ML IV SOLN COMPARISON:  None Available. FINDINGS: Brain: No acute infarction, hemorrhage, hydrocephalus, extra-axial collection or intracranial mass lesion. Susceptibility artifact in the right precentral sulcus suggesting hemosiderin deposit. Scattered foci of T2 hyperintensity are seen within the white matter of the cerebral hemispheres, nonspecific, most likely related to chronic small vessel ischemia. Mild parenchymal volume loss. Vascular: Normal flow voids. Skull and upper cervical spine: Normal marrow signal. Sinuses/Orbits: Negative. Other: Lobulated enhancing mass lesion within the right temporalis muscle measuring approximately 2.7 x 1.2 x 1.2 cm. On the susceptibility weighted images, 2 foci of susceptibility artifact  are seen within this lesion, suggesting phleboliths. A vascular channel appear to connect the lesion through the greater wing of the right sphenoid two diploic veins. The lesion has facilitated diffusion. IMPRESSION: 1. No evidence of intracranial metastatic disease. 2. Mild chronic microvascular ischemic changes of the white matter. 3. 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 A head CT could confirm the presence of phleboliths. Electronically Signed   By: Pedro Earls M.D.   On: 09/24/2021 17:14   MR Lumbar Spine W Wo Contrast  Result Date: 09/24/2021 CLINICAL DATA:  Renal mass, right N28.89 (ICD-10-CM). Hematologic malignancy, staging; She has L2 disease on CT scan. History of likely kidney cancer EXAM: MRI LUMBAR SPINE WITHOUT AND WITH CONTRAST TECHNIQUE: Multiplanar and multiecho pulse sequences of the lumbar spine were obtained without and with intravenous contrast.  CONTRAST:  51m GADAVIST GADOBUTROL 1 MMOL/ML IV SOLN COMPARISON:  CT of the abdomen Sep 10, 2021; CT of the chest May 26. FINDINGS: Segmentation:  Standard. Alignment:  Trace anterolisthesis at L3-4. Vertebrae: Pathologic compression fracture of the L2 vertebral body resulting in approximately 55% vertebral body height loss which is most significant on the left side fall. The tumor extends into the bilateral pedicles with expansion of the left pedicle as well as retropulsion of the posterior wall into the spinal canal on the left side with mass effect on the thecal sac without high-grade spinal canal stenosis. No definitive evidence of other metastatic lesion to the lumbar spine. Chronic endplate degenerative changes at L4-5 and L5-S1. No evidence of discitis. Conus medullaris and cauda equina: Conus extends to the L2 level. Conus and cauda equina appear normal. Paraspinal and other soft tissues: Musculature appear preserved. Large irregularly enhancing mass lesion in the upper pole of the left kidney, consistent with known renal cell carcinoma with multiple small satellite perinephric lesions. Associated thrombus within the left renal vein extending into the inferior vena cava left gonadal vein. A a 6.2 cm right renal cyst. Cholelithiasis. Hepatic heterogeneity related to known metastatic disease. Disc levels: T12-L1: No spinal canal or neural foraminal stenosis. L1-2: Disc bulge and facet degenerative changes as well as cortical expansion of the left L2 pedicle resulting in mild left neural foraminal narrowing. No spinal canal stenosis. L2-3: Retropulsion of the L2 posterior wall on the left and cortical expansion of the L2 pedicle resulting in overall mild spinal canal stenosis which is moderate on the left side. Superimposed disc bulge and facet degenerative changes also contribute for moderate bilateral neural foraminal narrowing. L3-4: Disc bulge and hypertrophic facet degenerative changes ligamentum flavum  redundancy resulting in moderate spinal canal stenosis and moderate bilateral neural foraminal narrowing. L4-5: Prominent loss of disc height, disc bulge and hypertrophic facet degenerative changes with ligamentum flavum redundancy resulting in moderate to severe spinal canal stenosis with severe narrowing of the bilateral subarticular zones and moderate bilateral neural foraminal narrowing, left greater than right. L5-S1: Prominent loss of disc height, disc bulge with associated osteophytic component and moderate facet hypertrophy with ligamentum flavum redundancy resulting in mild spinal canal stenosis with severe right and moderate left subarticular zone stenosis, moderate to severe bilateral neural foraminal narrowing. IMPRESSION: 1. Pathologic fracture of the L2 vertebral body with approximately 55% vertebral body height loss. Extension into the pedicles with retropulsion of the posterior wall and expansion of the left pedicle causing mass effect on the thecal sac and resulting in overall mild spinal canal stenosis. The lesion also contribute for  moderate left neural foraminal narrowing at L1-2 and L2-3. 2. Advanced degenerative changes of the lumbar spine with moderate spinal canal stenosis at L3-4 and moderate to severe spinal canal stenosis at L4-5. 3. Multilevel high-grade neural foraminal narrowing, as described above. 4. Findings consistent with left renal cell carcinoma with tumoral thrombus extending into the inferior vena cava and left gonadal vein, as seen on prior CT. Electronically Signed   By: Pedro Earls M.D.   On: 09/24/2021 16:46   CT Chest W Contrast  Result Date: 09/22/2021 CLINICAL DATA:  Recent diagnosis of renal cell carcinoma. Evaluate for thoracic metastatic disease. * Tracking Code: BO * EXAM: CT CHEST WITH CONTRAST TECHNIQUE: Multidetector CT imaging of the chest was performed during intravenous contrast administration. RADIATION DOSE REDUCTION: This exam was  performed according to the departmental dose-optimization program which includes automated exposure control, adjustment of the mA and/or kV according to patient size and/or use of iterative reconstruction technique. CONTRAST:  59m OMNIPAQUE IOHEXOL 300 MG/ML  SOLN COMPARISON:  Abdominopelvic CT 09/10/2021 FINDINGS: Cardiovascular: No acute vascular findings. Atherosclerosis of the aorta, great vessels and coronary arteries. The heart size is normal. There is no pericardial effusion. Mediastinum/Nodes: There are no enlarged mediastinal, hilar or axillary lymph nodes.Indeterminate low-density right thyroid nodule measuring 2.4 x 2.1 cm on image 17/2. The esophagus and trachea appear unremarkable. Lungs/Pleura: No pleural effusion or pneumothorax. There is an endobronchial lesion within the left lower lobe which measures up to 1.4 cm on image 87/5. Upstream from this, the corresponding bronchus is impacted. There are scattered additional small pulmonary nodules, largest measuring 6 mm in the left lower lobe on image 64/5. In the right lower lobe, a nodular density measuring approximately 7 mm on image 120/5 is intimately associated with an adjacent vessel, and is probably a small varix. Upper abdomen: As seen on recent abdominal CT, there is a large heterogeneous mass involving the upper pole of the left kidney consistent with renal cell carcinoma. This mass demonstrates local extension into the retroperitoneal fat, and there is tumor thrombus in the left renal vein which may extend into the IVC. Extensive metastatic disease to the liver is again noted. Musculoskeletal/Chest wall: No definite osseous metastases are identified within the chest. Lytic lesion within the L2 vertebral body again noted, consistent with a metastasis. IMPRESSION: 1. No findings definitive for metastatic disease within the chest. 2. There are small indeterminate pulmonary nodules bilaterally, and there is impaction of a left lower lobe  bronchus by a potential endobronchial mass. Recommend attention on follow-up. 3. 2.4 cm right thyroid nodule. Recommend thyroid [place](Ref: J Am Coll Radiol. 2015 Feb;12(2): 143-50). 4. Again demonstrated findings of locally advanced left renal cell carcinoma with metastatic disease to the liver and L2 vertebral body. Suspected extension of tumor thrombus into the IVC. Electronically Signed   By: WRichardean SaleM.D.   On: 09/22/2021 11:54   [place] COMPLETE W TRANSVAGINAL AND TORSION R/O  Result Date: 09/10/2021 CLINICAL DATA:  Abnormal uterus on recent CT EXAM: TRANSABDOMINAL AND TRANSVAGINAL ULTRASOUND OF PELVIS DOPPLER ULTRASOUND OF OVARIES TECHNIQUE: Both transabdominal and transvaginal ultrasound examinations of the pelvis were performed. Transabdominal technique was performed for global imaging of the pelvis including uterus, ovaries, adnexal regions, and pelvic cul-de-sac. It was necessary to proceed with endovaginal exam following the transabdominal exam to visualize the ovaries. Color and duplex Doppler ultrasound was utilized to evaluate blood flow to the ovaries. COMPARISON:  None Available. FINDINGS: Uterus Measurements: 5.9 x 2.9  x 5.0 cm. = volume: 45 mL. Diffusely heterogeneous with calcified lesions consistent with small fibroids. Largest of these measures 2.5 cm in the fundus. Endometrium Thickness: 4 mm. No discrete abnormality to correspond with the hypo dense region seen on recent CT. Right ovary Not visualized Left ovary Not visualized Pulsed Doppler evaluation of both ovaries demonstrates normal low-resistance arterial and venous waveforms. Other findings No abnormal free fluid. IMPRESSION: Uterine fibroid change. No endometrial abnormality is identified to correspond with that seen on recent CT examination . Electronically Signed   By: Inez Catalina M.D.   On: 09/10/2021 22:05   CT Abdomen Pelvis W Contrast  Result Date: 09/10/2021 CLINICAL DATA:  Severe abdominal pain and nausea. *  Tracking Code: BO * EXAM: CT ABDOMEN AND PELVIS WITH CONTRAST TECHNIQUE: Multidetector CT imaging of the abdomen and pelvis was performed using the standard protocol following bolus administration of intravenous contrast. RADIATION DOSE REDUCTION: This exam was performed according to the departmental dose-optimization program which includes automated exposure control, adjustment of the mA and/or kV according to patient size and/or use of iterative reconstruction technique. CONTRAST:  166m OMNIPAQUE IOHEXOL 300 MG/ML  SOLN COMPARISON:  None Available. FINDINGS: Lower Chest: No acute findings. Hepatobiliary: Multiple hypovascular masses with peripheral rim enhancement are seen throughout the liver, consistent with diffuse liver metastases. One index inferior right hepatic lobe measures 4.0 x 2.7 cm on image 37/3. Another index lesion in the left hepatic lobe measures 2.6 x 2.2 cm on image 25/3. Small calcified gallstone is noted, however there is no evidence of cholecystitis or biliary ductal dilatation. Pancreas:  No mass or inflammatory changes. Spleen: Within normal limits in size and appearance. Adrenals/Urinary Tract: 5.7 x 5.8 cm heterogeneously enhancing mass in upper pole of left kidney, consistent with renal cell carcinoma. Soft tissue nodules are seen within a left posterior perinephric space, largest measuring 2.0 x 1.4 cm. Tumor thrombus is also seen extending into the left renal vein which extends to the midline. Mild nonenhancing thrombus seen in the distal left gonadal vein. No evidence of IVC tumor thrombus. 6.6 cm benign-appearing right renal cyst also noted. Stomach/Bowel: No evidence of obstruction, inflammatory process or abnormal fluid collections. Diverticulosis is seen mainly involving the sigmoid colon, however there is no evidence of diverticulitis. Vascular/Lymphatic: No pathologically enlarged lymph nodes. No acute vascular findings. Aortic atherosclerotic calcification incidentally noted.  Reproductive: Image resolution is limited by artifact from bilateral hip prostheses. Several small calcified uterine fibroids are noted. Central low attenuation is also seen within the endometrial cavity may represent diffuse endometrial thickening. Other:  None. Musculoskeletal: Lytic bone lesion involving L2 vertebral body and pedicle, consistent with bone metastasis IMPRESSION: 5.8 cm left upper pole renal mass, consistent with renal cell carcinoma. Adjacent soft tissue nodules within a left posterior perinephric space. Tumor thrombus in the left renal vein, with mild bland thrombus in distal left gonadal vein. 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 Cholelithiasis. No radiographic evidence of cholecystitis. Colonic diverticulosis, without radiographic evidence of diverticulitis. Small calcified uterine fibroids. Possible diffuse endometrial thickening although visualization limited by artifact from hip prostheses. Endometrial carcinoma cannot be excluded in a postmenopausal patient. Recommend transvaginal pelvic ultrasound for further evaluation. Aortic Atherosclerosis (ICD10-I70.0). Electronically Signed   By: JMarlaine HindM.D.   On: 09/10/2021 20:03   DG Chest 2 View  Result Date: 09/10/2021 CLINICAL DATA:  Abdominal pain, decreased appetite EXAM: CHEST - 2 VIEW COMPARISON:  None Available. FINDINGS: The heart size and mediastinal  contours are within normal limits. Both lungs are clear. The visualized skeletal structures are unremarkable. IMPRESSION: No active cardiopulmonary disease. Electronically Signed   By: Elmer Picker M.D.   On: 09/10/2021 19:27    Labs:  CBC: Recent Labs    10/05/21 0620 10/06/21 0628 10/07/21 0508 10/08/21 0456  WBC 9.6 9.7 9.4 9.6  HGB 11.1* 10.9* 10.8* 10.8*  HCT 36.2 35.0* 34.5* 34.8*  PLT 222 230 212 227    COAGS: Recent Labs    10/01/21 1058  INR 1.0    BMP: Recent Labs    10/03/21 0552 10/04/21 0534  10/05/21 0620 10/07/21 0729  NA 140 140 140 139  K 3.1* 3.4* 3.7 3.6  CL 103 106 107 107  CO2 '27 26 26 26  ' GLUCOSE 118* 149* 123* 121*  BUN 11 9 7* <5*  CALCIUM 9.6 8.9 8.9 8.6*  CREATININE 0.59 0.61 0.57 0.51  GFRNONAA >60 >60 >60 >60    LIVER FUNCTION TESTS: Recent Labs    10/03/21 0552 10/04/21 0534 10/05/21 0620 10/07/21 0729  BILITOT 1.1 0.7 0.7 0.5  AST 43* 42* 46* 41  ALT '20 22 24 20  ' ALKPHOS 193* 182* 224* 230*  PROT 6.6 5.8* 5.7* 5.7*  ALBUMIN 2.7* 2.3* 2.4* 2.2*    TUMOR MARKERS: No results for input(s): "AFPTM", "CEA", "CA199", "CHROMGRNA" in the last 8760 hours.  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  Patient is well-known to IR service having liver lesion biopsy 10/01/2021 with findings consistent with metastatic renal cell carcinoma.  Patient has been hospitalized since 09/30/2021 acute illness to include hypokalemia, hypercalcemia, hyperammonemia, confusion and delirium.  Patient was also found to have pathologic L2 vertebral fracture.  Dr. Marin Olp has placed request for tunneled catheter with port for patient to begin immunotherapy and kyphoplasty of L2.  Port placement tentatively scheduled for 10/08/2021. Patient is scheduled for kyphoplasty of L2 vertebral body with Osteocool 10/11/2021 with Dr. Maryelizabeth Kaufmann, IR.   Pt resting in bed. She is alert to self, location and situation but has fluctuating mentation.  She is in no distress. Pt daughter at bedside for most of the visit.  NPO order placed for 10/11/21 @ [facility].   Risks and benefits of L2 vertebral body augmentation with Osteocool were discussed with the patient and daughter including, but not limited to education regarding the natural healing process of compression fractures without intervention, bleeding, infection, cement migration which may cause spinal cord damage, paralysis, pulmonary embolism or even death.  This interventional procedure involves the use  of X-rays and because of the nature of the planned procedure, it is possible that we will have prolonged use of X-ray fluoroscopy.  Potential radiation risks to you include (but are not limited to) the following: - A slightly elevated risk for cancer  several years later in life. This risk is typically less than 0.5% percent. This risk is low in comparison to the normal incidence of human cancer, which is 33% for women and 50% for men according to the [employer]. - Radiation induced injury can include skin redness, resembling a rash, tissue breakdown / ulcers and hair loss (which can be temporary or permanent).   The likelihood of either of these occurring depends on the difficulty of the procedure and whether you are sensitive to radiation due to previous procedures, disease, or genetic conditions.   IF your procedure requires a prolonged use of radiation, you will be notified and given  written instructions for further action.  It is your responsibility to monitor the irradiated area for the 2 weeks following the procedure and to notify your physician if you are concerned that you have suffered a radiation induced injury.    All of the patient's and daughter's questions were answered, patient is agreeable to proceed.  Consent signed and in chart.    Thank you for this interesting consult.  I greatly enjoyed meeting Cheyeanne Roadcap and look forward to participating in their care.  A copy of this report was sent to the requesting provider on this date.  Electronically Signed: Tyson Alias, NP 10/08/2021, 10:32 AM   I spent a total of 20 minutes in face to face in clinical consultation, greater than 50% of which was counseling/coordinating care for pathologic L2 vertebral body fracture.

## 2021-10-08 NOTE — Progress Notes (Signed)
OT Cancellation Note  Patient Details Name: Jane Kennedy MRN: [id number] DOB: 1945-11-21   Cancelled Treatment:    Reason Eval/Treat Not Completed: Fatigue/lethargy limiting ability to participate Patient reported having long PT session this AM that wore her out and pending procedure later this afternoon. OT to continue to follow and check back as schedule will allow.  Jackelyn Poling OTR/L, [place] [facility] Department Office# [phone] Pager# [phone]   10/08/2021, 1:50 PM

## 2021-10-08 NOTE — Progress Notes (Signed)
Physical Therapy Treatment Patient Details Name: Jane Kennedy MRN: [id number] DOB: Nov 13, 1945 Today's Date: 10/08/2021   History of Present Illness 76 year old  f w/ actinic keratosis, benign neoplasm of large intestine, carpal tunnel syndrome, DDD lumbar sacral, diverticulosis, GERD, hyperlipidemia, IBS, osteopenia, vertigo, vitamin D deficiency presented with nausea, vomiting, abdominal pain and elevated calcium level.   Evaluated outside facility was diagnosed with UTI, antibiotics prescribed.  She was not able to fill out prescription.  She was found to have potassium of 2.3, magnesium 1.7, phosphorus 2.6, calcium 12.1.  Patient admitted for further treatment of hypercalcemia, hypokalemia in the setting of malignancy.  She has had worsening of confusion and delirium, agitated at times with tremors, was lethargic 6/6-CT head, ABG unrevealing. Suspect delirium multifactorial  related to acute illness, initial hypercalcemia, medications (receiving opioids, ativan), increase ammonia  level, started on lactulose.  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  Followed by Dr. Marin Olp.    PT Comments    General Comments: AxO x 3 very sweet Lady.   Assisted OOB to amb required increased time and + 2 asist for safety.  General bed mobility comments: educated on "log roll" due to L2 Comp Fx at 50% VC's on proper tech.  Required increaseds time transitioning to EOB and then back to bed.  Her Vertigo was easily triggered with this activity esp sit to supine. General transfer comment: 50% VC's to push from bed vs pull up on walker as well as using a focal point to control her vertigo.  Pt was able to self rise and lower.General Gait Details: very limited amb distance due to MAX c/o weakness/fatigue and increased c/o Vertigo.  Recliner following for safety. "I can't believ how weak I am", stated pt. Pt to receive a Ports Cath later today and scheduled for a Kyphoplasty  Friday, June  16th. Pt plans to D/C back home with 24/7 famiuy support.   Recommendations for follow up therapy are one component of a multi-disciplinary discharge planning process, led by the attending physician.  Recommendations may be updated based on patient status, additional functional criteria and insurance authorization.  Follow Up Recommendations  Home health PT     Assistance Recommended at Discharge Frequent or constant Supervision/Assistance  Patient can return home with the following A lot of help with walking and/or transfers;A lot of help with bathing/dressing/bathroom;Assistance with cooking/housework;Assistance with feeding;Direct supervision/assist for medications management;Direct supervision/assist for financial management;Assist for transportation;Help with stairs or ramp for entrance   Equipment Recommendations  Rolling walker (2 wheels)    Recommendations for Other Services       Precautions / Restrictions Precautions Precautions: Fall Precaution Comments: Bad Vertigo and L2 Comp Fx Restrictions Weight Bearing Restrictions: No     Mobility  Bed Mobility Overal bed mobility: Needs Assistance Bed Mobility: Rolling, Sidelying to Sit Rolling: Min guard, Min assist Sidelying to sit: Min assist       General bed mobility comments: educated on "log roll" due to L2 Comp Fx at 50% VC's on proper tech.  Required increaseds time transitioning to EOB and then back to bed.  Her Vertigo was easily triggered with this activity esp sit to supine.    Transfers Overall transfer level: Needs assistance Equipment used: Rolling walker (2 wheels) Transfers: Sit to/from Stand Sit to Stand: +2 safety/equipment, Min guard, Supervision           General transfer comment: 50% VC's to push from bed vs pull up on walker as  well as using a focal point to control her vertigo.  Pt was able to self rise and lower.    Ambulation/Gait Ambulation/Gait assistance: Min assist, Mod  assist Gait Distance (Feet): 8 Feet Assistive device: Rolling walker (2 wheels) Gait Pattern/deviations: Step-to pattern Gait velocity: decreased     General Gait Details: very limited amb distance due to MAX c/o weakness/fatigue and increased c/o Vertigo.  Recliner following for safety. "I can't believ how weak I am", stated pt.   Stairs             Wheelchair Mobility    Modified Rankin (Stroke Patients Only)       Balance                                            Cognition Arousal/Alertness: Awake/alert Behavior During Therapy: WFL for tasks assessed/performed Overall Cognitive Status: Within Functional Limits for tasks assessed                                 General Comments: AxO x 3 very sweet Lady        Exercises      General Comments        Pertinent Vitals/Pain Pain Assessment Pain Assessment: No/denies pain    Home Living                          Prior Function            PT Goals (current goals can now be found in the care plan section) Progress towards PT goals: Progressing toward goals    Frequency    Min 2X/week      PT Plan Current plan remains appropriate    Co-evaluation              AM-PAC PT "6 Clicks" Mobility   Outcome Measure  Help needed turning from your back to your side while in a flat bed without using bedrails?: A Little Help needed moving from lying on your back to sitting on the side of a flat bed without using bedrails?: A Little Help needed moving to and from a bed to a chair (including a wheelchair)?: A Lot Help needed standing up from a chair using your arms (e.g., wheelchair or bedside chair)?: A Lot Help needed to walk in hospital room?: A Lot Help needed climbing 3-5 steps with a railing? : Total 6 Click Score: 13    End of Session Equipment Utilized During Treatment: Gait belt Activity Tolerance: Patient limited by fatigue Patient left: in  bed;with call bell/phone within reach;with bed alarm set;with family/visitor present Nurse Communication: Mobility status PT Visit Diagnosis: Muscle weakness (generalized) (M62.81);Other abnormalities of gait and mobility (R26.89)     Time: 8250-0370 PT Time Calculation (min) (ACUTE ONLY): 25 min  Charges:  $Gait Training: 8-22 mins $Therapeutic Activity: 8-22 mins                     Rica Koyanagi  PTA Acute  Rehabilitation Services Pager      [phone] Office      [phone]

## 2021-10-08 NOTE — Progress Notes (Signed)
Nutrition Follow-up  INTERVENTION:   -Boost Breeze po TID, each supplement provides 250 kcal and 9 grams of protein   -Multivitamin with minerals daily  -Need new weight for admission  NUTRITION DIAGNOSIS:   Increased nutrient needs related to acute illness as evidenced by estimated needs.  Ongoing.  GOAL:   Patient will meet greater than or equal to 90% of their needs  Progressing.  MONITOR:   PO intake, Supplement acceptance, Labs, Weight trends, I & O's  REASON FOR ASSESSMENT:   Consult Assessment of nutrition requirement/status  ASSESSMENT:   76 year old with past medical history significant for actinic keratosis, benign neoplasm of large intestine, carpal tunnel syndrome, DDD DDD lumbar sacral, diverticulosis, GERD, hyperlipidemia, IBS, osteopenia, vertigo, vitamin D deficiency presents with nausea vomiting, abdominal pain and elevated calcium level.  6/7: s/p liver biopsy  Patient has been consuming 25-75% of meals. Currently NPO today for port-a-cath placement today. Scheduled for kyphoplasty later this week 6/16.   Admission weight: 130 lbs. Needs updated weight for admission.  Medications: Miralax, Senokot, Carafate, D5 infusion, Zofran  Labs reviewed.   Diet Order:   Diet Order             Diet NPO time specified Except for: Sips with Meds  Diet effective now                   EDUCATION NEEDS:   Not appropriate for education at this time  Skin:  Skin Assessment: Reviewed RN Assessment  Last BM:  6/12 -type 6  Height:   Ht Readings from Last 1 Encounters:  09/30/21 '5\' 3"'$  (1.6 m)    Weight:   Wt Readings from Last 1 Encounters:  09/30/21 59 kg    BMI:  Body mass index is 23.03 kg/m.  Estimated Nutritional Needs:   Kcal:  1500-1700  Protein:  70-85g  Fluid:  1.7L/day  Clayton Bibles, MS, RD, LDN Inpatient Clinical Dietitian Contact information available via Amion

## 2021-10-08 NOTE — H&P (Signed)
Chief Complaint: Patient was seen in consultation today for metastatic renal cell carcinoma at the request of Burney Gauze, MD Referring Physician(s): Burney Gauze, MD  Supervising Physician: Aletta Edouard  Patient Status: [facility] - In-pt  History of Present Illness: Jane Kennedy is a 76 y.o. female with PMH significant for metastatic renal cell carcinoma with bone and liver metastasis.  Patient is well-known to IR service having liver lesion biopsy 10/01/2021 with findings consistent with metastatic renal cell carcinoma.  Patient has been hospitalized since 09/30/2021 acute illness to include hypokalemia, hypercalcemia, hyperammonemia, confusion and delirium.  Patient was also found to have pathologic L2 vertebral fracture.  Dr. Marin Olp has placed request for tunneled catheter with port for patient to begin immunotherapy and kyphoplasty of L2.  Port placement tentatively scheduled for 10/08/2021. Patient is scheduled for kyphoplasty of L2 vertebral body with osteocool 10/11/2021 with Dr. Maryelizabeth Kaufmann, IR.   Past Medical History:  Diagnosis Date   Cancer ([place])    Hyperlipidemia    Vertigo    Vitamin D deficiency 06/09/2015    Past Surgical History:  Procedure Laterality Date   CARPAL TUNNEL RELEASE Left    REPLACEMENT TOTAL HIP W/  RESURFACING IMPLANTS Bilateral     Allergies: Codeine and Nickel  Medications: Prior to Admission medications   Medication Sig Start Date End Date Taking? Authorizing Provider  acetaminophen (TYLENOL) 325 MG tablet Take 325 mg by mouth once. pain   Yes [provider]  amitriptyline (ELAVIL) 100 MG tablet Take 100 mg by mouth daily.   Yes [provider]  atorvastatin (LIPITOR) 40 MG tablet Take 40 mg by mouth daily. 09/18/21  Yes [provider]  Calcium Carbonate Antacid (TUMS PO) Take 2 tablets by mouth daily as needed (acid reflux).   Yes [provider]  ibuprofen (ADVIL) 200 MG tablet Take 200-400 mg by mouth 2 (two)  times daily as needed for moderate pain.   Yes [provider]  lidocaine (XYLOCAINE) 5 % ointment Apply 1 application. topically 2 (two) times daily as needed (back pain).   Yes [provider]  omeprazole (PRILOSEC) 20 MG capsule Take 20 mg by mouth daily. 07/01/21  Yes [provider]  ondansetron (ZOFRAN) 8 MG tablet Take 1 tablet (8 mg total) by mouth every 8 (eight) hours as needed for nausea or vomiting. 09/20/21  Yes Ennever, Rudell Cobb, MD  cephALEXin (KEFLEX) 500 MG capsule Take 500 mg by mouth 2 (two) times daily. Patient not taking: Reported on 09/30/2021 09/29/21   [provider]  HYDROcodone-acetaminophen (NORCO/VICODIN) 5-325 MG tablet Take 1-2 tablets by mouth every 4 (four) hours as needed for moderate pain. Max 4-6 tabs a day Patient not taking: Reported on 09/30/2021 09/17/21   Volanda Napoleon, MD  Ibuprofen-diphenhydrAMINE HCl (IBUPROFEN PM) 200-25 MG CAPS Take 1 tablet by mouth at bedtime as needed (sleep). Patient not taking: Reported on 09/30/2021    [provider]  ondansetron (ZOFRAN-ODT) 4 MG disintegrating tablet Take by mouth. Patient not taking: Reported on 09/30/2021 09/29/21   [provider]  traMADol Veatrice Bourbon) 50 MG tablet  09/29/21   [provider]     Family History  Problem Relation Age of Onset   Migraines Mother    Multiple myeloma Mother    Diabetes Mellitus II Father    Stroke Maternal Grandfather    Colon cancer Maternal Aunt    Breast cancer Maternal Aunt     Social History   Socioeconomic History  Marital status: Widowed    Spouse name: Not on file   Number of children: Not on file   Years of education: Not on file   Highest education level: Not on file  Occupational History   Not on file  Tobacco Use   Smoking status: Former    Packs/day: 1.00    Types: Cigarettes    Quit date: 2018    Years since quitting: 5.4   Smokeless tobacco: Never  Vaping Use   Vaping Use: Never used  Substance  and Sexual Activity   Alcohol use: Never   Drug use: Never   Sexual activity: Not on file  Other Topics Concern   Not on file  Social History Narrative   Not on file   Social Determinants of Health   Financial Resource Strain: Not on file  Food Insecurity: Not on file  Transportation Needs: Not on file  Physical Activity: Not on file  Stress: Not on file  Social Connections: Not on file    Review of Systems: A 12 point ROS discussed and pertinent positives are indicated in the HPI above.  All other systems are negative.  Review of Systems  Constitutional:  Positive for appetite change and fatigue. Negative for chills and fever.  Respiratory:  Negative for shortness of breath.   Cardiovascular:  Negative for chest pain and leg swelling.  Gastrointestinal:  Positive for nausea and vomiting. Negative for abdominal pain.  Musculoskeletal:  Positive for back pain.  Neurological:  Positive for dizziness and weakness. Negative for headaches.    Vital Signs: BP 137/60 (BP Location: Left Arm)   Pulse 95   Temp 98.2 F (36.8 C) (Oral)   Resp 18   Ht '5\' 3"'  (1.6 m)   Wt 130 lb (59 kg)   SpO2 99%   BMI 23.03 kg/m   Physical Exam Constitutional:      General: She is not in acute distress.    Appearance: She is ill-appearing.  HENT:     Head: Normocephalic and atraumatic.     Mouth/Throat:     Mouth: Mucous membranes are moist.     Pharynx: Oropharynx is clear.  Eyes:     Extraocular Movements: Extraocular movements intact.     Pupils: Pupils are equal, round, and reactive to light.  Cardiovascular:     Rate and Rhythm: Regular rhythm. Tachycardia present.     Pulses: Normal pulses.     Heart sounds: Normal heart sounds.  Pulmonary:     Effort: Pulmonary effort is normal. No respiratory distress.     Breath sounds: Normal breath sounds.  Abdominal:     General: Bowel sounds are normal. There is no distension.     Palpations: Abdomen is soft.     Tenderness: There is  no abdominal tenderness. There is no guarding.  Musculoskeletal:     Right lower leg: No edema.     Left lower leg: No edema.  Skin:    General: Skin is warm and dry.  Neurological:     Mental Status: She is alert. She is disoriented.  Psychiatric:        Mood and Affect: Mood normal.        Behavior: Behavior normal.     Imaging: US BIOPSY (LIVER)  Result Date: 10/02/2021 INDICATION: Imaging findings consistent with metastatic renal cell carcinoma. Liver metastases. EXAM: ULTRASOUND LEFT LIVER METASTASIS 18 GAUGE CORE BIOPSY MEDICATIONS: 1% LIDOCAINE LOCAL ANESTHESIA/SEDATION: Moderate (conscious) sedation was employed during this procedure. A  total of Versed 1.0 mg and Fentanyl 25 mcg was administered intravenously by the radiology nurse. Total intra-service moderate Sedation Time: 11 minutes. The patient's level of consciousness and vital signs were monitored continuously by radiology nursing throughout the procedure under my direct supervision. COMPLICATIONS: None immediate. PROCEDURE: Informed written consent was obtained from the patient after a thorough discussion of the procedural risks, benefits and alternatives. All questions were addressed. Maximal Sterile Barrier Technique was utilized including caps, mask, sterile gowns, sterile gloves, sterile drape, hand hygiene and skin antiseptic. A timeout was performed prior to the initiation of the procedure. previous imaging reviewed. preliminary ultrasound performed. solid hyperechoic liver lesions demonstrated correlating with the CT. a left hepatic lesion was localized and marked in the subxiphoid area for biopsy. Under sterile conditions and local anesthesia, ultrasound guidance utilized to advance a 17 gauge coaxial guide needle into a left hepatic lesion. Needle position confirmed with ultrasound. Through the access, 18 gauge core biopsies obtained. Samples were intact and non fragmented. Images obtained for documentation. These were placed  in formalin. Needle tract occluded with Gel-Foam. Postprocedure imaging demonstrates no hemorrhage or hematoma. Patient tolerated the biopsy well. Of note, left renal vein and IVC thrombus noted correlating with the tumor thrombus by CT. IMPRESSION: Successful ultrasound left hepatic metastasis 18 gauge core biopsy. Electronically Signed   By: Jerilynn Mages.  Shick M.D.   On: 10/02/2021 14:46   CT HEAD WO CONTRAST (5MM)  Result Date: 10/01/2021 CLINICAL DATA:  Altered mental status EXAM: CT HEAD WITHOUT CONTRAST TECHNIQUE: Contiguous axial images were obtained from the base of the skull through the vertex without intravenous contrast. RADIATION DOSE REDUCTION: This exam was performed according to the departmental dose-optimization program which includes automated exposure control, adjustment of the mA and/or kV according to patient size and/or use of iterative reconstruction technique. COMPARISON:  Correlation made with MRI May 2023 FINDINGS: Brain: There is no acute intracranial hemorrhage, mass effect, or edema. Gray-white differentiation is preserved. There is no extra-axial fluid collection. Prominence of the ventricles and sulci reflects mild parenchymal volume loss. Patchy hypoattenuation in the supratentorial white matter is nonspecific but may reflect mild chronic microvascular ischemic changes. Vascular: There is mild atherosclerotic calcification at the skull base. Skull: Calvarium is unremarkable. Sinuses/Orbits: No acute finding. Other: Mastoid air cells are clear. There is no right temporalis abnormality on CT corresponding to MR finding. 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 IMPRESSION: No acute intracranial abnormality. Electronically Signed   By: Macy Mis M.D.   On: 10/01/2021 12:55   MR Brain W Wo Contrast  Result Date: 09/24/2021 CLINICAL DATA:  Renal mass, right N28.89 (ICD-10-CM). Staging for metastatic kidney cancer. EXAM: MRI HEAD WITHOUT AND WITH CONTRAST TECHNIQUE:  Multiplanar, multiecho pulse sequences of the brain and surrounding structures were obtained without and with intravenous contrast. CONTRAST:  3m GADAVIST GADOBUTROL 1 MMOL/ML IV SOLN COMPARISON:  None Available. FINDINGS: Brain: No acute infarction, hemorrhage, hydrocephalus, extra-axial collection or intracranial mass lesion. Susceptibility artifact in the right precentral sulcus suggesting hemosiderin deposit. Scattered foci of T2 hyperintensity are seen within the white matter of the cerebral hemispheres, nonspecific, most likely related to chronic small vessel ischemia. Mild parenchymal volume loss. Vascular: Normal flow voids. Skull and upper cervical spine: Normal marrow signal. Sinuses/Orbits: Negative. Other: Lobulated enhancing mass lesion within the right temporalis muscle measuring approximately 2.7 x 1.2 x 1.2 cm. On the susceptibility weighted images, 2 foci of susceptibility artifact are seen within this lesion, suggesting phleboliths. A vascular channel  appear to connect the lesion through the greater wing of the right sphenoid two diploic veins. The lesion has facilitated diffusion. IMPRESSION: 1. No evidence of intracranial metastatic disease. 2. Mild chronic microvascular ischemic changes of the white matter. 3. 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 A head CT could confirm the presence of phleboliths. Electronically Signed   By: Pedro Earls M.D.   On: 09/24/2021 17:14   MR Lumbar Spine W Wo Contrast  Result Date: 09/24/2021 CLINICAL DATA:  Renal mass, right N28.89 (ICD-10-CM). Hematologic malignancy, staging; She has L2 disease on CT scan. History of likely kidney cancer EXAM: MRI LUMBAR SPINE WITHOUT AND WITH CONTRAST TECHNIQUE: Multiplanar and multiecho pulse sequences of the lumbar spine were obtained without and with intravenous contrast. CONTRAST:  74m GADAVIST GADOBUTROL 1 MMOL/ML IV SOLN COMPARISON:  CT  of the abdomen Sep 10, 2021; CT of the chest May 26. FINDINGS: Segmentation:  Standard. Alignment:  Trace anterolisthesis at L3-4. Vertebrae: Pathologic compression fracture of the L2 vertebral body resulting in approximately 55% vertebral body height loss which is most significant on the left side fall. The tumor extends into the bilateral pedicles with expansion of the left pedicle as well as retropulsion of the posterior wall into the spinal canal on the left side with mass effect on the thecal sac without high-grade spinal canal stenosis. No definitive evidence of other metastatic lesion to the lumbar spine. Chronic endplate degenerative changes at L4-5 and L5-S1. No evidence of discitis. Conus medullaris and cauda equina: Conus extends to the L2 level. Conus and cauda equina appear normal. Paraspinal and other soft tissues: Musculature appear preserved. Large irregularly enhancing mass lesion in the upper pole of the left kidney, consistent with known renal cell carcinoma with multiple small satellite perinephric lesions. Associated thrombus within the left renal vein extending into the inferior vena cava left gonadal vein. A a 6.2 cm right renal cyst. Cholelithiasis. Hepatic heterogeneity related to known metastatic disease. Disc levels: T12-L1: No spinal canal or neural foraminal stenosis. L1-2: Disc bulge and facet degenerative changes as well as cortical expansion of the left L2 pedicle resulting in mild left neural foraminal narrowing. No spinal canal stenosis. L2-3: Retropulsion of the L2 posterior wall on the left and cortical expansion of the L2 pedicle resulting in overall mild spinal canal stenosis which is moderate on the left side. Superimposed disc bulge and facet degenerative changes also contribute for moderate bilateral neural foraminal narrowing. L3-4: Disc bulge and hypertrophic facet degenerative changes ligamentum flavum redundancy resulting in moderate spinal canal stenosis and moderate  bilateral neural foraminal narrowing. L4-5: Prominent loss of disc height, disc bulge and hypertrophic facet degenerative changes with ligamentum flavum redundancy resulting in moderate to severe spinal canal stenosis with severe narrowing of the bilateral subarticular zones and moderate bilateral neural foraminal narrowing, left greater than right. L5-S1: Prominent loss of disc height, disc bulge with associated osteophytic component and moderate facet hypertrophy with ligamentum flavum redundancy resulting in mild spinal canal stenosis with severe right and moderate left subarticular zone stenosis, moderate to severe bilateral neural foraminal narrowing. IMPRESSION: 1. Pathologic fracture of the L2 vertebral body with approximately 55% vertebral body height loss. Extension into the pedicles with retropulsion of the posterior wall and expansion of the left pedicle causing mass effect on the thecal sac and resulting in overall mild spinal canal stenosis. The lesion also contribute for moderate left neural foraminal narrowing at L1-2 and L2-3. 2.  Advanced degenerative changes of the lumbar spine with moderate spinal canal stenosis at L3-4 and moderate to severe spinal canal stenosis at L4-5. 3. Multilevel high-grade neural foraminal narrowing, as described above. 4. Findings consistent with left renal cell carcinoma with tumoral thrombus extending into the inferior vena cava and left gonadal vein, as seen on prior CT. Electronically Signed   By: Pedro Earls M.D.   On: 09/24/2021 16:46   CT Chest W Contrast  Result Date: 09/22/2021 CLINICAL DATA:  Recent diagnosis of renal cell carcinoma. Evaluate for thoracic metastatic disease. * Tracking Code: BO * EXAM: CT CHEST WITH CONTRAST TECHNIQUE: Multidetector CT imaging of the chest was performed during intravenous contrast administration. RADIATION DOSE REDUCTION: This exam was performed according to the departmental dose-optimization program which  includes automated exposure control, adjustment of the mA and/or kV according to patient size and/or use of iterative reconstruction technique. CONTRAST:  55m OMNIPAQUE IOHEXOL 300 MG/ML  SOLN COMPARISON:  Abdominopelvic CT 09/10/2021 FINDINGS: Cardiovascular: No acute vascular findings. Atherosclerosis of the aorta, great vessels and coronary arteries. The heart size is normal. There is no pericardial effusion. Mediastinum/Nodes: There are no enlarged mediastinal, hilar or axillary lymph nodes.Indeterminate low-density right thyroid nodule measuring 2.4 x 2.1 cm on image 17/2. The esophagus and trachea appear unremarkable. Lungs/Pleura: No pleural effusion or pneumothorax. There is an endobronchial lesion within the left lower lobe which measures up to 1.4 cm on image 87/5. Upstream from this, the corresponding bronchus is impacted. There are scattered additional small pulmonary nodules, largest measuring 6 mm in the left lower lobe on image 64/5. In the right lower lobe, a nodular density measuring approximately 7 mm on image 120/5 is intimately associated with an adjacent vessel, and is probably a small varix. Upper abdomen: As seen on recent abdominal CT, there is a large heterogeneous mass involving the upper pole of the left kidney consistent with renal cell carcinoma. This mass demonstrates local extension into the retroperitoneal fat, and there is tumor thrombus in the left renal vein which may extend into the IVC. Extensive metastatic disease to the liver is again noted. Musculoskeletal/Chest wall: No definite osseous metastases are identified within the chest. Lytic lesion within the L2 vertebral body again noted, consistent with a metastasis. IMPRESSION: 1. No findings definitive for metastatic disease within the chest. 2. There are small indeterminate pulmonary nodules bilaterally, and there is impaction of a left lower lobe bronchus by a potential endobronchial mass. Recommend attention on follow-up.  3. 2.4 cm right thyroid nodule. Recommend thyroid [place](Ref: J Am Coll Radiol. 2015 Feb;12(2): 143-50). 4. Again demonstrated findings of locally advanced left renal cell carcinoma with metastatic disease to the liver and L2 vertebral body. Suspected extension of tumor thrombus into the IVC. Electronically Signed   By: WRichardean SaleM.D.   On: 09/22/2021 11:54   [place] COMPLETE W TRANSVAGINAL AND TORSION R/O  Result Date: 09/10/2021 CLINICAL DATA:  Abnormal uterus on recent CT EXAM: TRANSABDOMINAL AND TRANSVAGINAL ULTRASOUND OF PELVIS DOPPLER ULTRASOUND OF OVARIES TECHNIQUE: Both transabdominal and transvaginal ultrasound examinations of the pelvis were performed. Transabdominal technique was performed for global imaging of the pelvis including uterus, ovaries, adnexal regions, and pelvic cul-de-sac. It was necessary to proceed with endovaginal exam following the transabdominal exam to visualize the ovaries. Color and duplex Doppler ultrasound was utilized to evaluate blood flow to the ovaries. COMPARISON:  None Available. FINDINGS: Uterus Measurements: 5.9 x 2.9 x 5.0 cm. = volume: 45 mL. Diffusely heterogeneous with  calcified lesions consistent with small fibroids. Largest of these measures 2.5 cm in the fundus. Endometrium Thickness: 4 mm. No discrete abnormality to correspond with the hypo dense region seen on recent CT. Right ovary Not visualized Left ovary Not visualized Pulsed Doppler evaluation of both ovaries demonstrates normal low-resistance arterial and venous waveforms. Other findings No abnormal free fluid. IMPRESSION: Uterine fibroid change. No endometrial abnormality is identified to correspond with that seen on recent CT examination . Electronically Signed   By: Inez Catalina M.D.   On: 09/10/2021 22:05   CT Abdomen Pelvis W Contrast  Result Date: 09/10/2021 CLINICAL DATA:  Severe abdominal pain and nausea. * Tracking Code: BO * EXAM: CT ABDOMEN AND PELVIS WITH CONTRAST TECHNIQUE:  Multidetector CT imaging of the abdomen and pelvis was performed using the standard protocol following bolus administration of intravenous contrast. RADIATION DOSE REDUCTION: This exam was performed according to the departmental dose-optimization program which includes automated exposure control, adjustment of the mA and/or kV according to patient size and/or use of iterative reconstruction technique. CONTRAST:  120m OMNIPAQUE IOHEXOL 300 MG/ML  SOLN COMPARISON:  None Available. FINDINGS: Lower Chest: No acute findings. Hepatobiliary: Multiple hypovascular masses with peripheral rim enhancement are seen throughout the liver, consistent with diffuse liver metastases. One index inferior right hepatic lobe measures 4.0 x 2.7 cm on image 37/3. Another index lesion in the left hepatic lobe measures 2.6 x 2.2 cm on image 25/3. Small calcified gallstone is noted, however there is no evidence of cholecystitis or biliary ductal dilatation. Pancreas:  No mass or inflammatory changes. Spleen: Within normal limits in size and appearance. Adrenals/Urinary Tract: 5.7 x 5.8 cm heterogeneously enhancing mass in upper pole of left kidney, consistent with renal cell carcinoma. Soft tissue nodules are seen within a left posterior perinephric space, largest measuring 2.0 x 1.4 cm. Tumor thrombus is also seen extending into the left renal vein which extends to the midline. Mild nonenhancing thrombus seen in the distal left gonadal vein. No evidence of IVC tumor thrombus. 6.6 cm benign-appearing right renal cyst also noted. Stomach/Bowel: No evidence of obstruction, inflammatory process or abnormal fluid collections. Diverticulosis is seen mainly involving the sigmoid colon, however there is no evidence of diverticulitis. Vascular/Lymphatic: No pathologically enlarged lymph nodes. No acute vascular findings. Aortic atherosclerotic calcification incidentally noted. Reproductive: Image resolution is limited by artifact from bilateral hip  prostheses. Several small calcified uterine fibroids are noted. Central low attenuation is also seen within the endometrial cavity may represent diffuse endometrial thickening. Other:  None. Musculoskeletal: Lytic bone lesion involving L2 vertebral body and pedicle, consistent with bone metastasis IMPRESSION: 5.8 cm left upper pole renal mass, consistent with renal cell carcinoma. Adjacent soft tissue nodules within a left posterior perinephric space. Tumor thrombus in the left renal vein, with mild bland thrombus in distal left gonadal vein. 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 Cholelithiasis. No radiographic evidence of cholecystitis. Colonic diverticulosis, without radiographic evidence of diverticulitis. Small calcified uterine fibroids. Possible diffuse endometrial thickening although visualization limited by artifact from hip prostheses. Endometrial carcinoma cannot be excluded in a postmenopausal patient. Recommend transvaginal pelvic ultrasound for further evaluation. Aortic Atherosclerosis (ICD10-I70.0). Electronically Signed   By: JMarlaine HindM.D.   On: 09/10/2021 20:03   DG Chest 2 View  Result Date: 09/10/2021 CLINICAL DATA:  Abdominal pain, decreased appetite EXAM: CHEST - 2 VIEW COMPARISON:  None Available. FINDINGS: The heart size and mediastinal contours are within normal limits. Both lungs are clear. The  visualized skeletal structures are unremarkable. IMPRESSION: No active cardiopulmonary disease. Electronically Signed   By: Elmer Picker M.D.   On: 09/10/2021 19:27    Labs:  CBC: Recent Labs    10/05/21 0620 10/06/21 0628 10/07/21 0508 10/08/21 0456  WBC 9.6 9.7 9.4 9.6  HGB 11.1* 10.9* 10.8* 10.8*  HCT 36.2 35.0* 34.5* 34.8*  PLT 222 230 212 227    COAGS: Recent Labs    10/01/21 1058  INR 1.0    BMP: Recent Labs    10/03/21 0552 10/04/21 0534 10/05/21 0620 10/07/21 0729  NA 140 140 140 139  K 3.1* 3.4* 3.7 3.6  CL  103 106 107 107  CO2 '27 26 26 26  ' GLUCOSE 118* 149* 123* 121*  BUN 11 9 7* <5*  CALCIUM 9.6 8.9 8.9 8.6*  CREATININE 0.59 0.61 0.57 0.51  GFRNONAA >60 >60 >60 >60    LIVER FUNCTION TESTS: Recent Labs    10/03/21 0552 10/04/21 0534 10/05/21 0620 10/07/21 0729  BILITOT 1.1 0.7 0.7 0.5  AST 43* 42* 46* 41  ALT '20 22 24 20  ' ALKPHOS 193* 182* 224* 230*  PROT 6.6 5.8* 5.7* 5.7*  ALBUMIN 2.7* 2.3* 2.4* 2.2*    TUMOR MARKERS: No results for input(s): "AFPTM", "CEA", "CA199", "CHROMGRNA" in the last 8760 hours.  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  Patient is well-known to IR service having liver lesion biopsy 10/01/2021 with findings consistent with metastatic renal cell carcinoma.  Patient has been hospitalized since 09/30/2021 acute illness to include hypokalemia, hypercalcemia, hyperammonemia, confusion and delirium.  Patient was also found to have pathologic L2 vertebral fracture.  Dr. Marin Olp has placed request for tunneled catheter with port for patient to begin immunotherapy and kyphoplasty of L2.  Port placement tentatively scheduled for 10/08/2021. Patient is scheduled for kyphoplasty of L2 vertebral body with osteocool 10/11/2021 with Dr. Maryelizabeth Kaufmann, IR.   Pt resting in bed. She is alert to self, location and situation but has fluctuating mentation.  She is in no distress. Pt daughter at bedside for most of the visit.  She is NPO per order.  Last lovenox was yesterday @ 1750.   Risks and benefits of image guided tunneled catheter with port placement was discussed with the patient and daughter including, but not limited to bleeding, infection, pneumothorax, or fibrin sheath development and need for additional procedures.  All of the patient's questions were answered, patient is agreeable to proceed.  Consent signed and in chart.   Thank you for this interesting consult.  I greatly enjoyed meeting Jane Kennedy and look  forward to participating in their care.  A copy of this report was sent to the requesting provider on this date.  Electronically Signed: Tyson Alias, NP 10/08/2021, 10:26 AM   I spent a total of 20 minutes in face to face in clinical consultation, greater than 50% of which was counseling/coordinating care for metastatic renal cell carcinoma.

## 2021-10-08 NOTE — Progress Notes (Signed)
PROGRESS NOTE  Jane Kennedy GYJ:[id number] DOB: 06-Mar-1946 DOA: 09/30/2021 PCP: Houston Siren., MD  HPI/Recap of past 44 hours: 76 year old  female w/ actinic keratosis, benign neoplasm of large intestine, carpal tunnel syndrome, DDD lumbar, diverticulosis, GERD, hyperlipidemia, IBS, osteopenia, vertigo, vitamin D deficiency who presented with nausea, vomiting, abdominal pain, and elevated calcium level 12.1.  She was admitted and treated for hypercalcemia.  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  Followed by Dr. Marin Olp.  She was found to have a pathological fracture at L2.  I will was consulted for kyphoplasty, planned on 10/11/2021.  10/08/2021: The patient was seen and examined at her bedside.  Reports uncontrolled pain overnight.  Pain control regimen adjusted.  Plan for port Placement for chemotherapy on 10/08/2021.  Assessment/Plan: Principal Problem:   Hypercalcemia Active Problems:   Gastroesophageal reflux disease with esophagitis   Hyperlipidemia, unspecified   Hypokalemia   Left kidney mass   Moderate protein malnutrition (HCC)   Acute metabolic encephalopathy   Fracture of L2 vertebra (HCC)   GERD (gastroesophageal reflux disease)   Iron deficiency anemia   Resolved hypercalcemia:  Likely related to her malignancy in the setting of renal mass/lytic lesion on the bone.  Received Zometa and calcitonin, calcium improving> 8.6 albumin 2.2.    Metastatic renal cell carcinoma She underwent liver biopsy on 10/02/2021 with findings consistent with metastatic renal cell carcinoma. Followed by Dr. Marin Olp Plan for port Placement for chemotherapy on 10/08/2021.  Pathologic fracture L2 vertebrae Plan for kyphoplasty on 10/11/2021 by IR. Pain management and bowel regimen in place. N.p.o. after midnight on 10/11/2021.  Resolved hyperammonemia Ammonia level is normalized 36 from 168 from 49. P.o. lactulose 20 g twice daily Goal 2-3  stools per day   Resolved hypokalemia:  Serum potassium 3.6.  Elevated liver chemistries Alkaline phosphatase, AST ALT uptrending. Repeat chemistry panel in the morning   Resolved acute metabolic encephalopathy suspect secondary to hyper ammonemia:  Continue delirium and fall precautions Treat underlying conditions. Reorient as needed   GERD/indigestion:  Continue p.o. PPI twice daily. Continue supportive care as needed.  Hyperlipidemia: Home statin on hold due to elevated liver chemistries   Left kidney mass Diffuse liver mets, lytic bone metastasis involving L2 vertebra: Underwent liver biopsy 6/7 with findings as stated above,  IR consulted kyphoplasty evaluation.   IR kyphoplasty on 10/11/2021   Moderate protein malnutrition:  Liberalize diet Dietitian consulted   Anemia iron deficiency  IV iron, S/P 2 unit PRBC 6/7 monitor Hemoglobin 10.8 No overt bleeding reported   Body mass index is 23.03 kg/m.   DVT prophylaxis: SCDs Start: 09/30/21 1526.  Subcu Lovenox daily. Code Status:   Code Status: Full Code Family Communication: Plan of care discussed with the patient and her brother at bedside Patient status is: Inpatient because of ongoing encephalopathy and work-up Level of care: Med-Surg    Dispo: The patient is from: home            Anticipated disposition: Likely discharge to home with home health services after kyphoplasty on Monday.   Mobility Assessment (last 72 hours)      Status is: Inpatient The patient requires at least 2 midnight for further evaluation and treatment of present condition.    Objective: Vitals:   10/07/21 1346 10/07/21 1946 10/08/21 0443 10/08/21 1406  BP: 139/65 (!) 149/63 137/60 137/63  Pulse: 99 93 95 100  Resp: '20 20 18 16  '$ Temp: 97.9 F (36.6 C) 98.4 F (  36.9 C) 98.2 F (36.8 C) 98.5 F (36.9 C)  TempSrc: Oral  Oral Oral  SpO2: 99% 99% 99% 97%  Weight:      Height:        Intake/Output Summary (Last 24 hours) at  10/08/2021 1446 Last data filed at 10/08/2021 0900 Gross per 24 hour  Intake 0 ml  Output 1100 ml  Net -1100 ml   Filed Weights   09/30/21 1026  Weight: 59 kg    Exam:  General: 76 y.o. year-old female frail-appearing in no acute distress.  She is alert and oriented x3.   Cardiovascular: Regular rate and rhythm no rubs or gallops. Respiratory: Clear to auscultation no wheeze or rales. Abdomen: Soft nontender bowel sounds present. Musculoskeletal: No lower extremity edema bilaterally. Skin: No ulcerative lesions noted. Psychiatry: Mood is appropriate for condition and setting.   Data Reviewed: CBC: Recent Labs  Lab 10/04/21 0534 10/05/21 0620 10/06/21 0628 10/07/21 0508 10/08/21 0456  WBC 10.5 9.6 9.7 9.4 9.6  NEUTROABS 8.0* 6.9 7.1 6.8 6.8  HGB 11.0* 11.1* 10.9* 10.8* 10.8*  HCT 36.3 36.2 35.0* 34.5* 34.8*  MCV 84.8 83.6 82.7 82.7 83.3  PLT 247 222 230 212 287   Basic Metabolic Panel: Recent Labs  Lab 10/02/21 0502 10/02/21 1935 10/03/21 0552 10/04/21 0534 10/05/21 0620 10/07/21 0729  NA 142 141 140 140 140 139  K 2.8* 3.1* 3.1* 3.4* 3.7 3.6  CL 106 106 103 106 107 107  CO2 '30 29 27 26 26 26  '$ GLUCOSE 133* 131* 118* 149* 123* 121*  BUN '12 10 11 9 '$ 7* <5*  CREATININE 0.69 0.70 0.59 0.61 0.57 0.51  CALCIUM 9.7 9.3 9.6 8.9 8.9 8.6*  MG 2.2  --   --   --   --   --    GFR: Estimated Creatinine Clearance: 49.5 mL/min (by C-G formula based on SCr of 0.51 mg/dL). Liver Function Tests: Recent Labs  Lab 10/02/21 0502 10/03/21 0552 10/04/21 0534 10/05/21 0620 10/07/21 0729  AST 42* 43* 42* 46* 41  ALT '17 20 22 24 20  '$ ALKPHOS 172* 193* 182* 224* 230*  BILITOT 0.7 1.1 0.7 0.7 0.5  PROT 5.8* 6.6 5.8* 5.7* 5.7*  ALBUMIN 2.3* 2.7* 2.3* 2.4* 2.2*   No results for input(s): "LIPASE", "AMYLASE" in the last 168 hours.  Recent Labs  Lab 10/02/21 0502 10/06/21 0628 10/07/21 0729  AMMONIA 49* 168* 36*   Coagulation Profile: No results for input(s): "INR",  "PROTIME" in the last 168 hours.  Cardiac Enzymes: No results for input(s): "CKTOTAL", "CKMB", "CKMBINDEX", "TROPONINI" in the last 168 hours. BNP (last 3 results) No results for input(s): "PROBNP" in the last 8760 hours. HbA1C: No results for input(s): "HGBA1C" in the last 72 hours. CBG: No results for input(s): "GLUCAP" in the last 168 hours.  Lipid Profile: No results for input(s): "CHOL", "HDL", "LDLCALC", "TRIG", "CHOLHDL", "LDLDIRECT" in the last 72 hours. Thyroid Function Tests: No results for input(s): "TSH", "T4TOTAL", "FREET4", "T3FREE", "THYROIDAB" in the last 72 hours. Anemia Panel: No results for input(s): "VITAMINB12", "FOLATE", "FERRITIN", "TIBC", "IRON", "RETICCTPCT" in the last 72 hours. Urine analysis:    Component Value Date/Time   COLORURINE YELLOW 09/30/2021 [place] 09/30/2021 1417   LABSPEC 1.020 09/30/2021 1417   PHURINE 6.0 09/30/2021 1417   GLUCOSEU NEGATIVE 09/30/2021 1417   HGBUR NEGATIVE 09/30/2021 1417   BILIRUBINUR NEGATIVE 09/30/2021 1417   KETONESUR NEGATIVE 09/30/2021 1417   PROTEINUR NEGATIVE 09/30/2021 1417   NITRITE  NEGATIVE 09/30/2021 1417   LEUKOCYTESUR SMALL (A) 09/30/2021 1417   Sepsis Labs: '@LABRCNTIP'$ (procalcitonin:4,lacticidven:4)  ) Recent Results (from the past 240 hour(s))  Urine Culture     Status: None   Collection Time: 09/30/21  2:17 PM   Specimen: Urine, Clean Catch  Result Value Ref Range Status   Specimen Description   Final    URINE, CLEAN CATCH Performed at [facility], [place] [address] [place], [place] [address]    Special Requests   Final    NONE Performed at [facility], [place] [address], [place], [place] [address]    Culture   Final    NO GROWTH Performed at [facility], [place] [address] [place], [place] [address]    Report Status 10/01/2021 FINAL  Final      Studies: No results found.  Scheduled Meds:  enoxaparin (LOVENOX) injection  40  mg Subcutaneous Q24H   feeding supplement  1 Container Oral TID BM   fentaNYL  1 patch Transdermal Q72H   multivitamin with minerals  1 tablet Oral Daily   pantoprazole  40 mg Oral BID   polyethylene glycol  17 g Oral Daily   senna-docusate  2 tablet Oral QHS   sucralfate  1 g Oral TID WC & HS    Continuous Infusions:  dextrose 5% lactated ringers with KCl 20 mEq/L 50 mL/hr at 10/08/21 1015     LOS: 7 days     Kayleen Memos, MD [facility] Pager [phone]  If 7PM-7AM, please contact night-coverage [email or web address] Password [facility] 10/08/2021, 2:46 PM

## 2021-10-08 NOTE — Consult Note (Signed)
Radiation Oncology         [phone]) [phone] ________________________________  Name: Jane Kennedy        MRN: [id number]  Date of Service: 10/08/21  DOB: 1945/12/21  CC:Houston Siren., MD     REFERRING PHYSICIAN: Dr. Marin Olp   DIAGNOSIS: The primary encounter diagnosis was Hypercalcemia. Diagnoses of Hypokalemia, Left kidney mass, Iron deficiency anemia due to chronic blood loss, Gastroesophageal reflux disease without esophagitis, and Primary malignant neoplasm of kidney with metastasis from kidney to other site, unspecified laterality [facility]) were also pertinent to this visit.   HISTORY OF PRESENT ILLNESS: Jane Kennedy is a 75 y.o. female seen at the request of Dr. Marin Olp for a diagnosis of stage IV renal cell carcinoma.  The patient had been experiencing symptoms of back pain, and presented to the emergency department on 08/31/2021.  CT imaging identified a 5.8 cm left upper pole renal mass concerning for renal cell carcinoma with adjacent soft tissue nodules in the left posterior perinephric space.  Tumor thrombus in the left renal vein and mild bland thrombus in the distal left gonadal vein, diffuse liver metastases and a lytic lesion in the L2 vertebral body were seen.  CT of the chest was performed after she was dispositioned to outpatient oncology evaluation.  No evidence of metastatic disease in the chest was identified but an MRI of the lumbar spine with and without contrast on 09/22/2021 showed pathologic fracture of L2 vertebral body with approximately 55% vertebral height loss, extension into the pedicles with retropulsion of the posterior wall and expansion of the left pedicle causing mass effect on the thecal sac resulting in overall mild spinal canal stenosis, this also contributed to moderate left neural foraminal narrowing at L1 and 2 and L2 and 3.  Advanced degenerative changes of the lumbar spine with moderate canal stenosis at L3 and L4 were also noted.  She had an MRI of the brain on  09/22/2021 as well that was negative for metastatic disease.  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  She was counseled on the rationale for proceeding with tissue confirmation with a liver biopsy and for vertebral augmentation with interventional radiology.  She was referred to have these procedures done but was encouraged to proceed to the emergency department on 09/30/2021 due to hypercalcemia.  Since her hospitalization, she has undergone a liver biopsy on 10/02/2021 that confirmed metastatic renal cell carcinoma.  She has also been seen by the team in interventional radiology with plans for this Friday for osteo cool procedure and kyphoplasty.  She is seen today to discuss palliative radiotherapy as well.    PREVIOUS RADIATION THERAPY: No   PAST MEDICAL HISTORY:  Past Medical History:  Diagnosis Date   Cancer ([place])    Hyperlipidemia    Vertigo    Vitamin D deficiency 06/09/2015       PAST SURGICAL HISTORY: Past Surgical History:  Procedure Laterality Date   CARPAL TUNNEL RELEASE Left    REPLACEMENT TOTAL HIP W/  RESURFACING IMPLANTS Bilateral      FAMILY HISTORY:  Family History  Problem Relation Age of Onset   Migraines Mother    Multiple myeloma Mother    Diabetes Mellitus II Father    Stroke Maternal Grandfather    Colon cancer Maternal Aunt    Breast cancer Maternal Aunt      SOCIAL HISTORY:  reports that she quit smoking about 5 years ago. Her smoking use  included cigarettes. She smoked an average of 1 pack per day. She has never used smokeless tobacco. She reports that she does not drink alcohol and does not use drugs.  The patient is widowed and lives in [place].   ALLERGIES: Codeine and Nickel   MEDICATIONS:  Current Facility-Administered Medications  Medication Dose Route Frequency Provider Last Rate Last Admin   acetaminophen (TYLENOL) tablet 650 mg  650 mg Oral Q6H PRN Reubin Milan, MD       Or    acetaminophen (TYLENOL) suppository 650 mg  650 mg Rectal Q6H PRN Reubin Milan, MD       alum & mag hydroxide-simeth (MAALOX/MYLANTA) 200-200-20 MG/5ML suspension 30 mL  30 mL Oral Q4H PRN Irene Pap N, DO   30 mL at 10/07/21 2020   dextrose 5% in lactated ringers with KCl 20 mEq/L infusion   Intravenous Continuous Kc, Maren Beach, MD 50 mL/hr at 10/08/21 1015 New Bag at 10/08/21 1015   enoxaparin (LOVENOX) injection 40 mg  40 mg Subcutaneous Q24H Hall, Carole N, DO   40 mg at 10/07/21 1750   feeding supplement (BOOST / RESOURCE BREEZE) liquid 1 Container  1 Container Oral TID BM Regalado, Belkys A, MD   1 Container at 10/07/21 2019   fentaNYL (DURAGESIC) 25 MCG/HR 1 patch  1 patch Transdermal Q72H Volanda Napoleon, MD   1 patch at 10/08/21 0802   fentaNYL (SUBLIMAZE) injection   Intravenous PRN Greggory Keen, MD   25 mcg at 10/02/21 1328   HYDROmorphone (DILAUDID) injection 0.5 mg  0.5 mg Intravenous Q2H PRN Regalado, Belkys A, MD   0.5 mg at 10/08/21 0932   lidocaine (XYLOCAINE) 5 % ointment 1 application.  1 application  Topical BID PRN Reubin Milan, MD   1 application  at 45/03/88 8280   lip balm (CARMEX) ointment   Topical PRN Regalado, Belkys A, MD       LORazepam (ATIVAN) injection 0.5-1 mg  0.5-1 mg Intravenous Q4H PRN Regalado, Belkys A, MD       midazolam (VERSED) injection   Intravenous PRN Greggory Keen, MD       midazolam (VERSED) injection   Intravenous PRN Greggory Keen, MD   0.5 mg at 10/02/21 1326   morphine (MSIR) tablet 15 mg  15 mg Oral Q4H PRN Volanda Napoleon, MD       ondansetron [facility]) tablet 4 mg  4 mg Oral Q6H PRN Reubin Milan, MD       Or   ondansetron [facility]) injection 4 mg  4 mg Intravenous Q6H PRN Reubin Milan, MD   4 mg at 10/08/21 0033   oxyCODONE (Oxy IR/ROXICODONE) immediate release tablet 5 mg  5 mg Oral Q6H PRN Irene Pap N, DO       pantoprazole (PROTONIX) EC tablet 40 mg  40 mg Oral BID Volanda Napoleon, MD   40 mg at 10/08/21  1016   polyethylene glycol (MIRALAX / GLYCOLAX) packet 17 g  17 g Oral Daily Ennever, Rudell Cobb, MD       polyethylene glycol (MIRALAX / GLYCOLAX) packet 17 g  17 g Oral Daily PRN Hall, Carole N, DO       senna-docusate (Senokot-S) tablet 2 tablet  2 tablet Oral QHS Hall, Carole N, DO       sucralfate (CARAFATE) 1 GM/10ML suspension 1 g  1 g Oral TID WC & HS Volanda Napoleon, MD   1 g at 10/08/21 [phone]  REVIEW OF SYSTEMS: On review of systems, the patient reports that her pain is better controlled at this time. No loss of sensory or motor function is noted. She's hungry due to waiting for her PAC placement but is glad so much is getting done while in the hospital. She reports fatigue and constipation though her symptoms are better controlled with lactulose. No other complaints are verbalized.      PHYSICAL EXAM:  Wt Readings from Last 3 Encounters:  09/30/21 130 lb (59 kg)  09/16/21 134 lb (60.8 kg)   Temp Readings from Last 3 Encounters:  10/08/21 98.2 F (36.8 C) (Oral)  09/16/21 98.2 F (36.8 C) (Oral)  09/10/21 98.4 F (36.9 C) (Oral)   BP Readings from Last 3 Encounters:  10/08/21 137/60  09/16/21 (!) 106/56  09/10/21 131/69   Pulse Readings from Last 3 Encounters:  10/08/21 95  09/16/21 88  09/10/21 90   Pain Assessment Pain Score: 4 /10  In general this is a thin caucasian female in no acute distress. She's alert and oriented x4 and appropriate throughout the examination. Cardiopulmonary assessment is negative for acute distress and she exhibits normal effort.     ECOG = 2  0 - Asymptomatic (Fully active, able to carry on all predisease activities without restriction)  1 - Symptomatic but completely ambulatory (Restricted in physically strenuous activity but ambulatory and able to carry out work of a light or sedentary nature. For example, light housework, office work)  2 - Symptomatic, <50% in bed during the day (Ambulatory and capable of all self care but  unable to carry out any work activities. Up and about more than 50% of waking hours)  3 - Symptomatic, >50% in bed, but not bedbound (Capable of only limited self-care, confined to bed or chair 50% or more of waking hours)  4 - Bedbound (Completely disabled. Cannot carry on any self-care. Totally confined to bed or chair)  5 - Death   Eustace Pen MM, Creech RH, Tormey DC, et al. [phone]). "Toxicity and response criteria of the [facility]". [place] Oncol. 5 (6): 649-55    LABORATORY DATA:  Lab Results  Component Value Date   WBC 9.6 10/08/2021   HGB 10.8 (L) 10/08/2021   HCT 34.8 (L) 10/08/2021   MCV 83.3 10/08/2021   PLT 227 10/08/2021   Lab Results  Component Value Date   NA 139 10/07/2021   K 3.6 10/07/2021   CL 107 10/07/2021   CO2 26 10/07/2021   Lab Results  Component Value Date   ALT 20 10/07/2021   AST 41 10/07/2021   ALKPHOS 230 (H) 10/07/2021   BILITOT 0.5 10/07/2021      RADIOGRAPHY: US BIOPSY (LIVER)  Result Date: 10/02/2021 INDICATION: Imaging findings consistent with metastatic renal cell carcinoma. Liver metastases. EXAM: ULTRASOUND LEFT LIVER METASTASIS 18 GAUGE CORE BIOPSY MEDICATIONS: 1% LIDOCAINE LOCAL ANESTHESIA/SEDATION: Moderate (conscious) sedation was employed during this procedure. A total of Versed 1.0 mg and Fentanyl 25 mcg was administered intravenously by the radiology nurse. Total intra-service moderate Sedation Time: 11 minutes. The patient's level of consciousness and vital signs were monitored continuously by radiology nursing throughout the procedure under my direct supervision. COMPLICATIONS: None immediate. PROCEDURE: Informed written consent was obtained from the patient after a thorough discussion of the procedural risks, benefits and alternatives. All questions were addressed. Maximal Sterile Barrier Technique was utilized including caps, mask, sterile gowns, sterile gloves, sterile drape, hand hygiene and skin  antiseptic. A  timeout was performed prior to the initiation of the procedure. previous imaging reviewed. preliminary ultrasound performed. solid hyperechoic liver lesions demonstrated correlating with the CT. a left hepatic lesion was localized and marked in the subxiphoid area for biopsy. Under sterile conditions and local anesthesia, ultrasound guidance utilized to advance a 17 gauge coaxial guide needle into a left hepatic lesion. Needle position confirmed with ultrasound. Through the access, 18 gauge core biopsies obtained. Samples were intact and non fragmented. Images obtained for documentation. These were placed in formalin. Needle tract occluded with Gel-Foam. Postprocedure imaging demonstrates no hemorrhage or hematoma. Patient tolerated the biopsy well. Of note, left renal vein and IVC thrombus noted correlating with the tumor thrombus by CT. IMPRESSION: Successful ultrasound left hepatic metastasis 18 gauge core biopsy. Electronically Signed   By: Jerilynn Mages.  Shick M.D.   On: 10/02/2021 14:46   CT HEAD WO CONTRAST (5MM)  Result Date: 10/01/2021 CLINICAL DATA:  Altered mental status EXAM: CT HEAD WITHOUT CONTRAST TECHNIQUE: Contiguous axial images were obtained from the base of the skull through the vertex without intravenous contrast. RADIATION DOSE REDUCTION: This exam was performed according to the departmental dose-optimization program which includes automated exposure control, adjustment of the mA and/or kV according to patient size and/or use of iterative reconstruction technique. COMPARISON:  Correlation made with MRI May 2023 FINDINGS: Brain: There is no acute intracranial hemorrhage, mass effect, or edema. Gray-white differentiation is preserved. There is no extra-axial fluid collection. Prominence of the ventricles and sulci reflects mild parenchymal volume loss. Patchy hypoattenuation in the supratentorial white matter is nonspecific but may reflect mild chronic microvascular ischemic changes.  Vascular: There is mild atherosclerotic calcification at the skull base. Skull: Calvarium is unremarkable. Sinuses/Orbits: No acute finding. Other: Mastoid air cells are clear. There is no right temporalis abnormality on CT corresponding to MR finding. 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 IMPRESSION: No acute intracranial abnormality. Electronically Signed   By: Macy Mis M.D.   On: 10/01/2021 12:55   MR Brain W Wo Contrast  Result Date: 09/24/2021 CLINICAL DATA:  Renal mass, right N28.89 (ICD-10-CM). Staging for metastatic kidney cancer. EXAM: MRI HEAD WITHOUT AND WITH CONTRAST TECHNIQUE: Multiplanar, multiecho pulse sequences of the brain and surrounding structures were obtained without and with intravenous contrast. CONTRAST:  2m GADAVIST GADOBUTROL 1 MMOL/ML IV SOLN COMPARISON:  None Available. FINDINGS: Brain: No acute infarction, hemorrhage, hydrocephalus, extra-axial collection or intracranial mass lesion. Susceptibility artifact in the right precentral sulcus suggesting hemosiderin deposit. Scattered foci of T2 hyperintensity are seen within the white matter of the cerebral hemispheres, nonspecific, most likely related to chronic small vessel ischemia. Mild parenchymal volume loss. Vascular: Normal flow voids. Skull and upper cervical spine: Normal marrow signal. Sinuses/Orbits: Negative. Other: Lobulated enhancing mass lesion within the right temporalis muscle measuring approximately 2.7 x 1.2 x 1.2 cm. On the susceptibility weighted images, 2 foci of susceptibility artifact are seen within this lesion, suggesting phleboliths. A vascular channel appear to connect the lesion through the greater wing of the right sphenoid two diploic veins. The lesion has facilitated diffusion. IMPRESSION: 1. No evidence of intracranial metastatic disease. 2. Mild chronic microvascular ischemic changes of the white matter. 3. Lobulated enhancing lesion within the right temporalis muscle is  suggestive of a venous malformation. A head CT could confirm the presence of phleboliths. Electronically Signed   By: KPedro EarlsM.D.   On: 09/24/2021 17:14   MR Lumbar Spine W Wo Contrast  Result Date: 09/24/2021 CLINICAL  DATA:  Renal mass, right N28.89 (ICD-10-CM). Hematologic malignancy, staging; She has L2 disease on CT scan. History of likely kidney cancer EXAM: MRI LUMBAR SPINE WITHOUT AND WITH CONTRAST TECHNIQUE: Multiplanar and multiecho pulse sequences of the lumbar spine were obtained without and with intravenous contrast. CONTRAST:  47m GADAVIST GADOBUTROL 1 MMOL/ML IV SOLN COMPARISON:  CT of the abdomen Sep 10, 2021; CT of the chest May 26. FINDINGS: Segmentation:  Standard. Alignment:  Trace anterolisthesis at L3-4. Vertebrae: Pathologic compression fracture of the L2 vertebral body resulting in approximately 55% vertebral body height loss which is most significant on the left side fall. The tumor extends into the bilateral pedicles with expansion of the left pedicle as well as retropulsion of the posterior wall into the spinal canal on the left side with mass effect on the thecal sac without high-grade spinal canal stenosis. No definitive evidence of other metastatic lesion to the lumbar spine. Chronic endplate degenerative changes at L4-5 and L5-S1. No evidence of discitis. Conus medullaris and cauda equina: Conus extends to the L2 level. Conus and cauda equina appear normal. Paraspinal and other soft tissues: Musculature appear preserved. Large irregularly enhancing mass lesion in the upper pole of the left kidney, consistent with known renal cell carcinoma with multiple small satellite perinephric lesions. Associated thrombus within the left renal vein extending into the inferior vena cava left gonadal vein. A a 6.2 cm right renal cyst. Cholelithiasis. Hepatic heterogeneity related to known metastatic disease. Disc levels: T12-L1: No spinal canal or neural foraminal stenosis.  L1-2: Disc bulge and facet degenerative changes as well as cortical expansion of the left L2 pedicle resulting in mild left neural foraminal narrowing. No spinal canal stenosis. L2-3: Retropulsion of the L2 posterior wall on the left and cortical expansion of the L2 pedicle resulting in overall mild spinal canal stenosis which is moderate on the left side. Superimposed disc bulge and facet degenerative changes also contribute for moderate bilateral neural foraminal narrowing. L3-4: Disc bulge and hypertrophic facet degenerative changes ligamentum flavum redundancy resulting in moderate spinal canal stenosis and moderate bilateral neural foraminal narrowing. L4-5: Prominent loss of disc height, disc bulge and hypertrophic facet degenerative changes with ligamentum flavum redundancy resulting in moderate to severe spinal canal stenosis with severe narrowing of the bilateral subarticular zones and moderate bilateral neural foraminal narrowing, left greater than right. L5-S1: Prominent loss of disc height, disc bulge with associated osteophytic component and moderate facet hypertrophy with ligamentum flavum redundancy resulting in mild spinal canal stenosis with severe right and moderate left subarticular zone stenosis, moderate to severe bilateral neural foraminal narrowing. IMPRESSION: 1. Pathologic fracture of the L2 vertebral body with approximately 55% vertebral body height loss. Extension into the pedicles with retropulsion of the posterior wall and expansion of the left pedicle causing mass effect on the thecal sac and resulting in overall mild spinal canal stenosis. The lesion also contribute for moderate left neural foraminal narrowing at L1-2 and L2-3. 2. Advanced degenerative changes of the lumbar spine with moderate spinal canal stenosis at L3-4 and moderate to severe spinal canal stenosis at L4-5. 3. Multilevel high-grade neural foraminal narrowing, as described above. 4. Findings consistent with left renal  cell carcinoma with tumoral thrombus extending into the inferior vena cava and left gonadal vein, as seen on prior CT. Electronically Signed   By: KPedro EarlsM.D.   On: 09/24/2021 16:46   CT Chest W Contrast  Result Date: 09/22/2021 CLINICAL DATA:  Recent diagnosis of renal cell  carcinoma. Evaluate for thoracic metastatic disease. * Tracking Code: BO * EXAM: CT CHEST WITH CONTRAST TECHNIQUE: Multidetector CT imaging of the chest was performed during intravenous contrast administration. RADIATION DOSE REDUCTION: This exam was performed according to the departmental dose-optimization program which includes automated exposure control, adjustment of the mA and/or kV according to patient size and/or use of iterative reconstruction technique. CONTRAST:  62m OMNIPAQUE IOHEXOL 300 MG/ML  SOLN COMPARISON:  Abdominopelvic CT 09/10/2021 FINDINGS: Cardiovascular: No acute vascular findings. Atherosclerosis of the aorta, great vessels and coronary arteries. The heart size is normal. There is no pericardial effusion. Mediastinum/Nodes: There are no enlarged mediastinal, hilar or axillary lymph nodes.Indeterminate low-density right thyroid nodule measuring 2.4 x 2.1 cm on image 17/2. The esophagus and trachea appear unremarkable. Lungs/Pleura: No pleural effusion or pneumothorax. There is an endobronchial lesion within the left lower lobe which measures up to 1.4 cm on image 87/5. Upstream from this, the corresponding bronchus is impacted. There are scattered additional small pulmonary nodules, largest measuring 6 mm in the left lower lobe on image 64/5. In the right lower lobe, a nodular density measuring approximately 7 mm on image 120/5 is intimately associated with an adjacent vessel, and is probably a small varix. Upper abdomen: As seen on recent abdominal CT, there is a large heterogeneous mass involving the upper pole of the left kidney consistent with renal cell carcinoma. This mass demonstrates  local extension into the retroperitoneal fat, and there is tumor thrombus in the left renal vein which may extend into the IVC. Extensive metastatic disease to the liver is again noted. Musculoskeletal/Chest wall: No definite osseous metastases are identified within the chest. Lytic lesion within the L2 vertebral body again noted, consistent with a metastasis. IMPRESSION: 1. No findings definitive for metastatic disease within the chest. 2. There are small indeterminate pulmonary nodules bilaterally, and there is impaction of a left lower lobe bronchus by a potential endobronchial mass. Recommend attention on follow-up. 3. 2.4 cm right thyroid nodule. Recommend thyroid [place](Ref: J Am Coll Radiol. 2015 Feb;12(2): 143-50). 4. Again demonstrated findings of locally advanced left renal cell carcinoma with metastatic disease to the liver and L2 vertebral body. Suspected extension of tumor thrombus into the IVC. Electronically Signed   By: WRichardean SaleM.D.   On: 09/22/2021 11:54   [place] COMPLETE W TRANSVAGINAL AND TORSION R/O  Result Date: 09/10/2021 CLINICAL DATA:  Abnormal uterus on recent CT EXAM: TRANSABDOMINAL AND TRANSVAGINAL ULTRASOUND OF PELVIS DOPPLER ULTRASOUND OF OVARIES TECHNIQUE: Both transabdominal and transvaginal ultrasound examinations of the pelvis were performed. Transabdominal technique was performed for global imaging of the pelvis including uterus, ovaries, adnexal regions, and pelvic cul-de-sac. It was necessary to proceed with endovaginal exam following the transabdominal exam to visualize the ovaries. Color and duplex Doppler ultrasound was utilized to evaluate blood flow to the ovaries. COMPARISON:  None Available. FINDINGS: Uterus Measurements: 5.9 x 2.9 x 5.0 cm. = volume: 45 mL. Diffusely heterogeneous with calcified lesions consistent with small fibroids. Largest of these measures 2.5 cm in the fundus. Endometrium Thickness: 4 mm. No discrete abnormality to correspond with the hypo  dense region seen on recent CT. Right ovary Not visualized Left ovary Not visualized Pulsed Doppler evaluation of both ovaries demonstrates normal low-resistance arterial and venous waveforms. Other findings No abnormal free fluid. IMPRESSION: Uterine fibroid change. No endometrial abnormality is identified to correspond with that seen on recent CT examination . Electronically Signed   By: MInez CatalinaM.D.   On: 09/10/2021  22:05   CT Abdomen Pelvis W Contrast  Result Date: 09/10/2021 CLINICAL DATA:  Severe abdominal pain and nausea. * Tracking Code: BO * EXAM: CT ABDOMEN AND PELVIS WITH CONTRAST TECHNIQUE: Multidetector CT imaging of the abdomen and pelvis was performed using the standard protocol following bolus administration of intravenous contrast. RADIATION DOSE REDUCTION: This exam was performed according to the departmental dose-optimization program which includes automated exposure control, adjustment of the mA and/or kV according to patient size and/or use of iterative reconstruction technique. CONTRAST:  144m OMNIPAQUE IOHEXOL 300 MG/ML  SOLN COMPARISON:  None Available. FINDINGS: Lower Chest: No acute findings. Hepatobiliary: Multiple hypovascular masses with peripheral rim enhancement are seen throughout the liver, consistent with diffuse liver metastases. One index inferior right hepatic lobe measures 4.0 x 2.7 cm on image 37/3. Another index lesion in the left hepatic lobe measures 2.6 x 2.2 cm on image 25/3. Small calcified gallstone is noted, however there is no evidence of cholecystitis or biliary ductal dilatation. Pancreas:  No mass or inflammatory changes. Spleen: Within normal limits in size and appearance. Adrenals/Urinary Tract: 5.7 x 5.8 cm heterogeneously enhancing mass in upper pole of left kidney, consistent with renal cell carcinoma. Soft tissue nodules are seen within a left posterior perinephric space, largest measuring 2.0 x 1.4 cm. Tumor thrombus is also seen extending into the  left renal vein which extends to the midline. Mild nonenhancing thrombus seen in the distal left gonadal vein. No evidence of IVC tumor thrombus. 6.6 cm benign-appearing right renal cyst also noted. Stomach/Bowel: No evidence of obstruction, inflammatory process or abnormal fluid collections. Diverticulosis is seen mainly involving the sigmoid colon, however there is no evidence of diverticulitis. Vascular/Lymphatic: No pathologically enlarged lymph nodes. No acute vascular findings. Aortic atherosclerotic calcification incidentally noted. Reproductive: Image resolution is limited by artifact from bilateral hip prostheses. Several small calcified uterine fibroids are noted. Central low attenuation is also seen within the endometrial cavity may represent diffuse endometrial thickening. Other:  None. Musculoskeletal: Lytic bone lesion involving L2 vertebral body and pedicle, consistent with bone metastasis IMPRESSION: 5.8 cm left upper pole renal mass, consistent with renal cell carcinoma. Adjacent soft tissue nodules within a left posterior perinephric space. Tumor thrombus in the left renal vein, with mild bland thrombus in distal left gonadal vein. 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 Cholelithiasis. No radiographic evidence of cholecystitis. Colonic diverticulosis, without radiographic evidence of diverticulitis. Small calcified uterine fibroids. Possible diffuse endometrial thickening although visualization limited by artifact from hip prostheses. Endometrial carcinoma cannot be excluded in a postmenopausal patient. Recommend transvaginal pelvic ultrasound for further evaluation. Aortic Atherosclerosis (ICD10-I70.0). Electronically Signed   By: JMarlaine HindM.D.   On: 09/10/2021 20:03   DG Chest 2 View  Result Date: 09/10/2021 CLINICAL DATA:  Abdominal pain, decreased appetite EXAM: CHEST - 2 VIEW COMPARISON:  None Available. FINDINGS: The heart size and mediastinal  contours are within normal limits. Both lungs are clear. The visualized skeletal structures are unremarkable. IMPRESSION: No active cardiopulmonary disease. Electronically Signed   By: PElmer PickerM.D.   On: 09/10/2021 19:27       IMPRESSION/PLAN: 1. 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 Dr. MLisbeth Renshawhas reviewed her imaging and pathology findings and today I reviewed the rationale for a palliative course of radiotherapy after she undergoes osteocool/kyphoplasty on Friday. Dr. MLisbeth Renshawwould recommend 2-3 weeks of radiotherapy depending on her systemic therapy to the L2 vertebral body. We discussed the risks, benefits, short,  and long term effects of radiotherapy, as well as the palliative intent, and the patient is interested in proceeding. I outlined the delivery and logistics of radiotherapy and that we would anticipate simulation next Monday, likely as an outpatient. Written consent is obtained and placed in the chart, a copy was provided to the patient.    In a visit lasting 60 minutes, greater than 50% of the time was spent face to face and in floor time discussing the patient's condition, in preparation for the discussion, and coordinating the patient's care.     Carola Rhine, [facility]   **Disclaimer: This note was dictated with voice recognition software. Similar sounding words can inadvertently be transcribed and this note may contain transcription errors which may not have been corrected upon publication of note.**

## 2021-10-09 ENCOUNTER — Inpatient Hospital Stay (HOSPITAL_COMMUNITY): Payer: Medicare HMO

## 2021-10-09 DIAGNOSIS — S32020D Wedge compression fracture of second lumbar vertebra, subsequent encounter for fracture with routine healing: Secondary | ICD-10-CM

## 2021-10-09 HISTORY — PX: IR IMAGING GUIDED PORT INSERTION: IMG5740

## 2021-10-09 LAB — COMPREHENSIVE METABOLIC PANEL
ALT: 18 U/L (ref 0–44)
AST: 34 U/L (ref 15–41)
Albumin: 2.2 g/dL — ABNORMAL LOW (ref 3.5–5.0)
Alkaline Phosphatase: 237 U/L — ABNORMAL HIGH (ref 38–126)
Anion gap: 5 (ref 5–15)
BUN: 6 mg/dL — ABNORMAL LOW (ref 8–23)
CO2: 26 mmol/L (ref 22–32)
Calcium: 9.1 mg/dL (ref 8.9–10.3)
Chloride: 108 mmol/L (ref 98–111)
Creatinine, Ser: 0.6 mg/dL (ref 0.44–1.00)
GFR, Estimated: >60 mL/min (ref >60)
Glucose, Bld: 123 mg/dL — ABNORMAL HIGH (ref 70–99)
Potassium: 4 mmol/L (ref 3.5–5.1)
Sodium: 139 mmol/L (ref 135–145)
Total Bilirubin: 0.4 mg/dL (ref 0.3–1.2)
Total Protein: 5.6 g/dL — ABNORMAL LOW (ref 6.5–8.1)

## 2021-10-09 LAB — CBC WITH DIFFERENTIAL/PLATELET
Abs Immature Granulocytes: 0.1 10*3/uL — ABNORMAL HIGH (ref 0.00–0.07)
Basophils Absolute: 0 10*3/uL (ref 0.0–0.1)
Basophils Relative: 1 %
Eosinophils Absolute: 0.2 10*3/uL (ref 0.0–0.5)
Eosinophils Relative: 3 %
HCT: 35 % — ABNORMAL LOW (ref 36.0–46.0)
Hemoglobin: 10.7 g/dL — ABNORMAL LOW (ref 12.0–15.0)
Immature Granulocytes: 1 %
Lymphocytes Relative: 13 %
Lymphs Abs: 1.1 10*3/uL (ref 0.7–4.0)
MCH: 25.6 pg — ABNORMAL LOW (ref 26.0–34.0)
MCHC: 30.6 g/dL (ref 30.0–36.0)
MCV: 83.7 fL (ref 80.0–100.0)
Monocytes Absolute: 1 10*3/uL (ref 0.1–1.0)
Monocytes Relative: 11 %
Neutro Abs: 6.3 10*3/uL (ref 1.7–7.7)
Neutrophils Relative %: 71 %
Platelets: 237 10*3/uL (ref 150–400)
RBC: 4.18 MIL/uL (ref 3.87–5.11)
RDW: 17.9 % — ABNORMAL HIGH (ref 11.5–15.5)
WBC: 8.7 10*3/uL (ref 4.0–10.5)
nRBC: 0 % (ref 0.0–0.2)

## 2021-10-09 LAB — PHOSPHORUS: Phosphorus: 1.5 mg/dL — ABNORMAL LOW (ref 2.5–4.6)

## 2021-10-09 LAB — MAGNESIUM: Magnesium: 1.8 mg/dL (ref 1.7–2.4)

## 2021-10-09 IMAGING — XA IR IMAGING GUIDED PORT INSERTION
1 series · 1 of 1 positions shown · non-contrast
Comparison: None Available.

INDICATION: 76-year-old female with history of metastatic renal cell carcinoma
requiring central venous access for chemotherapy administration.

EXAM:
IMPLANTED PORT A CATH PLACEMENT WITH ULTRASOUND AND FLUOROSCOPIC
GUIDANCE

[Series 1: ir fluoro/shunt/fist · 1 of 1 slices shown]
[im 1/1]
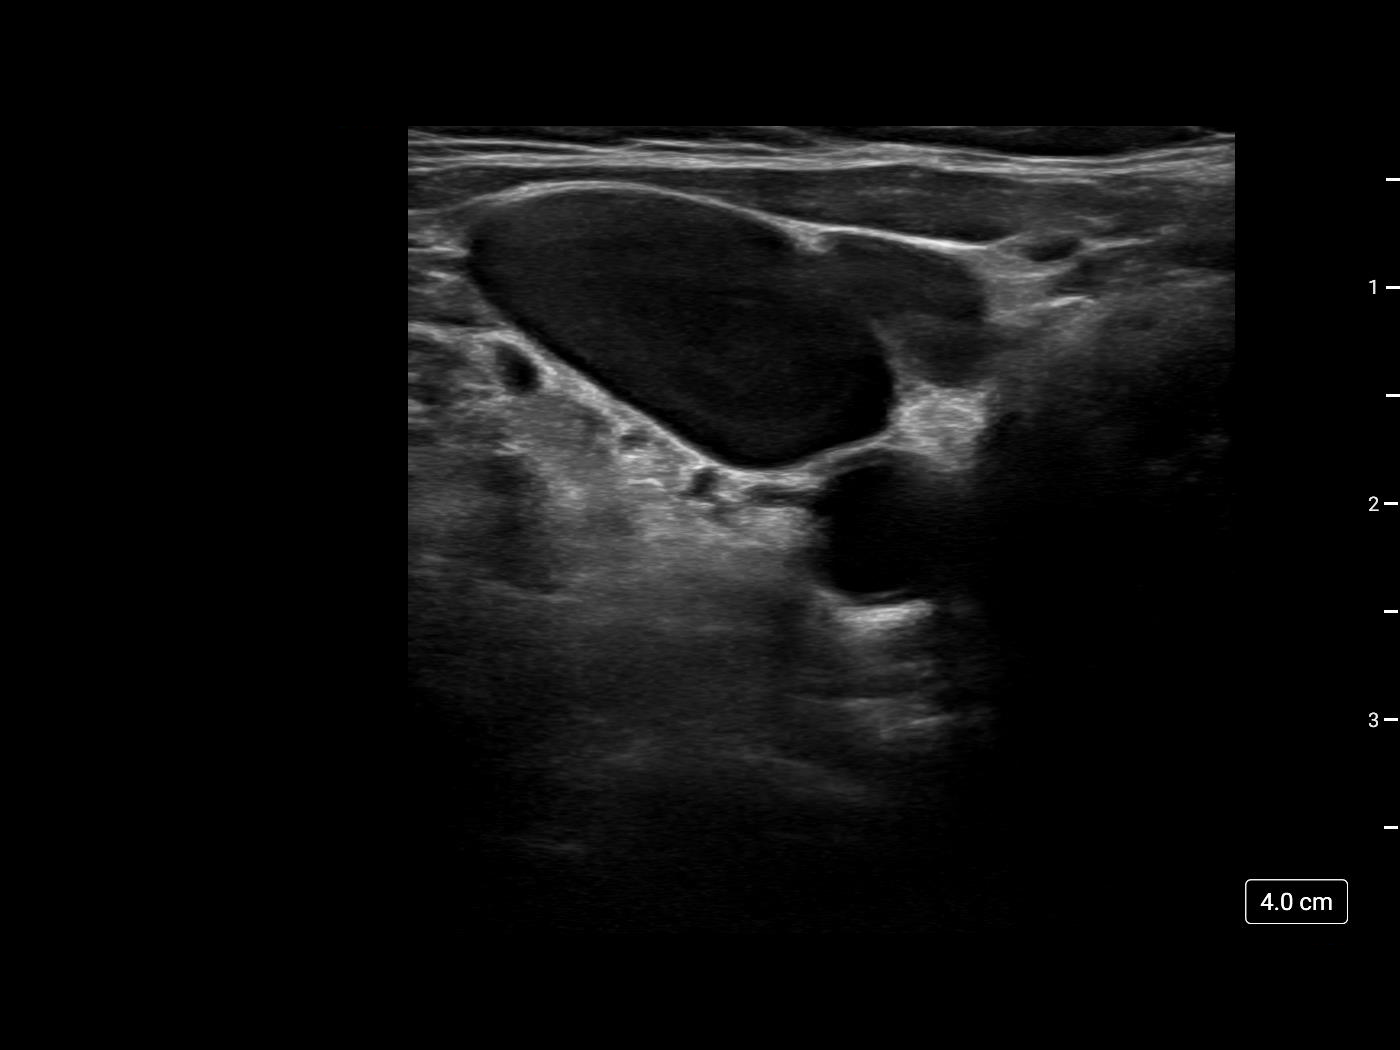

[1 of 1 positions shown; findings below may reference images not displayed]

MEDICATIONS:
None

ANESTHESIA/SEDATION:
Moderate (conscious) sedation was employed during this procedure. A
total of Versed 1 mg and Fentanyl 0 mcg was administered
intravenously.

Moderate Sedation Time: 15 minutes. The patient's level of
consciousness and vital signs were monitored continuously by
radiology nursing throughout the procedure under my direct
supervision.

CONTRAST:  None

FLUOROSCOPY TIME:  0 minutes,  ( mGy)

COMPLICATIONS:
None immediate.

PROCEDURE:
The procedure, risks, benefits, and alternatives were explained to
the patient. Questions regarding the procedure were encouraged and
answered. The patient understands and consents to the procedure.

The right neck and chest were prepped with chlorhexidine in a
sterile fashion, and a sterile drape was applied covering the
operative field. Maximum barrier sterile technique with sterile
gowns and gloves were used for the procedure. A timeout was
performed prior to the initiation of the procedure.

Ultrasound was used to examine the jugular vein which was
compressible and free of internal echoes. A skin marker was used to
demarcate the planned venotomy and port pocket incision sites. Local
anesthesia was provided to these sites and the subcutaneous tunnel
track with 1% lidocaine with [DATE] epinephrine.

A small incision was created at the jugular access site and blunt
dissection was performed of the subcutaneous tissues. Under
ultrasound guidance, the jugular vein was accessed with a 21 ga
micropuncture needle and an 0.018" wire was inserted to the superior
vena cava. Real-time ultrasound guidance was utilized for vascular
access including the acquisition of a permanent ultrasound image
documenting patency of the accessed vessel. A 5 Fr micopuncture set
was then used, through which a 0.035" Rosen wire was passed under
fluoroscopic guidance into the inferior vena cava. An 8 Fr dilator
was then placed over the wire.

A subcutaneous port pocket was then created along the upper chest
wall utilizing a combination of sharp and blunt dissection. The
pocket was irrigated with sterile saline, packed with gauze, and
observed for hemorrhage. A single lumen "ISP" sized power injectable
port was chosen for placement. The 8 Fr catheter was tunneled from
the port pocket site to the venotomy incision. The port was placed
in the pocket. The external catheter was trimmed to appropriate
length. The dilator was exchanged for an 8 Fr peel-away sheath under
fluoroscopic guidance. The catheter was then placed through the
sheath and the sheath was removed. Final catheter positioning was
confirmed and documented with a fluoroscopic spot radiograph. The
port was accessed with MARVINSITO needle, aspirated, and flushed with
heparinized saline.

The deep dermal layer of the port pocket incision was closed with
interrupted 3-0 Vicryl suture. Dermabond was then placed over the
port pocket and neck incisions. The patient tolerated the procedure
well without immediate post procedural complication.
FINDINGS: After catheter placement, the tip lies within the superior
cavoatrial junction. The catheter aspirates and flushes normally and
is ready for immediate use.
IMPRESSION: Successful placement of a power injectable Port-A-Cath via the right
internal jugular vein. The catheter is ready for immediate use.

## 2021-10-09 MED ORDER — K PHOS MONO-SOD PHOS DI & MONO 155-852-130 MG PO TABS
500.0000 mg | ORAL_TABLET | Freq: Two times a day (BID) | ORAL | Status: AC
Start: 2021-10-09 — End: 2021-10-09
  Administered 2021-10-09 (×2): 500 mg via ORAL
  Filled 2021-10-09 (×2): qty 2

## 2021-10-09 MED ORDER — MIDAZOLAM HCL 2 MG/2ML IJ SOLN
INTRAMUSCULAR | Status: AC
  Filled 2021-10-09: qty 2

## 2021-10-09 MED ORDER — LIDOCAINE-EPINEPHRINE 1 %-1:100000 IJ SOLN
INTRAMUSCULAR | Status: AC | PRN
  Administered 2021-10-09: 10 mL via INTRADERMAL

## 2021-10-09 MED ORDER — HEPARIN SOD (PORK) LOCK FLUSH 100 UNIT/ML IV SOLN
INTRAVENOUS | Status: AC | PRN
  Administered 2021-10-09: 500 [IU] via INTRAVENOUS

## 2021-10-09 MED ORDER — LIDOCAINE-EPINEPHRINE 1 %-1:100000 IJ SOLN
INTRAMUSCULAR | Status: AC
  Filled 2021-10-09: qty 1

## 2021-10-09 MED ORDER — MIDAZOLAM HCL 2 MG/2ML IJ SOLN
INTRAMUSCULAR | Status: AC | PRN
  Administered 2021-10-09: 1 mg via INTRAVENOUS

## 2021-10-09 MED ORDER — HEPARIN SOD (PORK) LOCK FLUSH 100 UNIT/ML IV SOLN
INTRAVENOUS | Status: AC
  Filled 2021-10-09: qty 5

## 2021-10-09 NOTE — Procedures (Signed)
Interventional Radiology Procedure Note ° °Procedure: Single Lumen Power Port Placement   ° °Access:  Right internal jugular vein ° °Findings: Catheter tip positioned at cavoatrial junction. Port is ready for immediate use.  ° °Complications: None ° °EBL: < 10 mL ° °Recommendations:  °- Ok to shower in 24 hours °- Do not submerge for 7 days °- Routine line care  ° ° °Anelly Samarin, MD ° ° ° °

## 2021-10-09 NOTE — Plan of Care (Signed)
  Problem: Health Behavior/Discharge Planning: Goal: Ability to manage health-related needs will improve Outcome: Progressing   

## 2021-10-09 NOTE — Progress Notes (Signed)
I can tell that she seems to be a little more confused this morning.  There is not a lot but yet this seems a bit different than yesterday.  I think that she just has a difficult time with opioids.  I would stop the fentanyl patch on her.  This is might be too much.  She is going for her Port-A-Cath today.  It sounds like she did not have the kyphoplasty on Friday.  After that, we will see about her radiation.  Her labs show calcium is 9.1.  Albumin is 2.2.  Her white count is 8.7 with a hemoglobin of 10.7.  Her alkaline phosphatase is 237.  Her blood sugar is 123.  She is not complaining of any pain.  It looks like she is going to the bathroom okay.  I do appreciate physical therapy trying to help with her activity level.  I know she does have her challenges.  Hopefully, she will continue to improve and get stronger with physical therapy.  She has had no nausea or vomiting.  Does not sound like there is a lot of issues with reflux.  There is no bleeding.  Her vital signs are temperature 99.2.  Pulse 100.  Blood pressure 131/62.  Her lungs sound clear.  She has good air movement bilaterally.  Cardiac exam regular rate and rhythm.  Abdomen is soft.  Bowel sounds are present.  There is no fluid wave.  There is no palpable liver or spleen tip.  Extremity shows no clubbing, cyanosis or edema.  Neurological exam shows no focal neurological deficits.  She may have a little bit of pressured speech.  Again, I think that Jane Kennedy is quite sensitive to narcotics.  We really have to try to be conservative with using these if possible.  We will stop the Duragesic patch.  Nutrition is can be key for her.  Hopefully, she will be able to eat after she has the porta cath put in.  I know that she is getting wonderful care from all the staff upon 5 E.  I appreciate their efforts.  Lattie Haw, MD  Psalms 46:10

## 2021-10-09 NOTE — Progress Notes (Signed)
Progress Note   Patient: Jane Kennedy OBS:[id number] DOB: 09/28/1945 DOA: 09/30/2021     8 DOS: the patient was seen and examined on 10/09/2021   Brief hospital course: 76 year old  f w/ actinic keratosis, benign neoplasm of large intestine, carpal tunnel syndrome, DDD DDD lumbar sacral, diverticulosis, GERD, hyperlipidemia, IBS, osteopenia, vertigo, vitamin D deficiency presented with nausea vomiting, abdominal pain and elevated calcium level.  Evaluated outside facility was diagnosed with UTI, antibiotics prescribed.  She was not able to fill out prescription.She was found to have potassium of 2.3, magnesium 1.7, phosphorus 2.6, calcium 12.1.  Patient admitted for further treatment of hypercalcemia, hypokalemia in the setting of malignancy. She has had worsening of her confusion and delirium agitated at times with tremors, was lethargic 6/6-CT head, ABG unrevealing. Suspect delirium multifactorial  related to acute illness, initial hypercalcemia, medications (receiving opioids, ativan), increase ammonia  level, started on lactulose enema.she underwent liver biopsy 6/07.  Followed by Dr. Marin Olp  Assessment and Plan: No notes have been filed under this hospital service. Service: Hospitalist Resolved hypercalcemia:  Likely related to her malignancy in the setting of renal mass/lytic lesion on the bone.  Received Zometa and calcitonin, calcium now 9.1 -repeat bmet in AM   Metastatic renal cell carcinoma She underwent liver biopsy on 10/02/2021 with findings consistent with metastatic renal cell carcinoma. Followed by Dr. Marin Olp Now s/p port Placement for chemotherapy on 6/14   Pathologic fracture L2 vertebrae Plan for kyphoplasty on 10/11/2021 by IR. Pain management and bowel regimen in place.   Resolved hyperammonemia Ammonia level is normalized 36 from 168 from 49. P.o. lactulose 20 g twice daily Goal 2-3 stools per day   Resolved hypokalemia:  Serum potassium 4.0   Elevated liver  chemistries Alkaline phosphatase, AST ALT uptrending. Cont to follow LFT trends   Resolved acute metabolic encephalopathy suspect secondary to hyper ammonemia:  Continue delirium and fall precautions Treat underlying conditions. Reorient as needed   GERD/indigestion:  Continue p.o. PPI twice daily. Continue supportive care as needed.   Hyperlipidemia: Home statin on hold due to elevated liver chemistries   Left kidney mass Diffuse liver mets, lytic bone metastasis involving L2 vertebra: Underwent liver biopsy 6/7 with findings as stated above,  IR consulted kyphoplasty evaluation.   IR kyphoplasty on 10/11/2021   Moderate protein malnutrition:  Liberalize diet Dietitian consulted   Anemia iron deficiency  IV iron, S/P 2 unit PRBC 6/7 monitor Hemoglobin 10.7 No overt bleeding reported      Subjective: Reports controlled pain this afternoon  Physical Exam: Vitals:   10/09/21 1610 10/09/21 1615 10/09/21 1620 10/09/21 1625  BP: 131/60 135/71 135/65 (!) 137/59  Pulse: 97 99 (!) 102 (!) 102  Resp: '18 17 14 19  '$ Temp:      TempSrc:      SpO2: 98% 99% 98% 97%  Weight:      Height:       General exam: Awake, laying in bed, in nad Respiratory system: Normal respiratory effort, no wheezing Cardiovascular system: regular rate, s1, s2 Gastrointestinal system: Soft, nondistended, positive BS Central nervous system: CN2-12 grossly intact, strength intact Extremities: Perfused, no clubbing Skin: Normal skin turgor, no notable skin lesions seen Psychiatry: Mood normal // no visual hallucinations   Data Reviewed:  Labs reviewed: Na 139, K 4.0, Cr 0.6  Family Communication: Pt in room, family at bedside  Disposition: Status is: Inpatient Remains inpatient appropriate because: Severity of illness  Planned Discharge Destination: Home    Author: Annie Main  Wyline Copas, MD 10/09/2021 4:44 PM  For on call review [email or web address].

## 2021-10-10 DIAGNOSIS — G9341 Metabolic encephalopathy: Secondary | ICD-10-CM | POA: Diagnosis not present

## 2021-10-10 LAB — CBC
HCT: 37 % (ref 36.0–46.0)
Hemoglobin: 11.2 g/dL — ABNORMAL LOW (ref 12.0–15.0)
MCH: 25.3 pg — ABNORMAL LOW (ref 26.0–34.0)
MCHC: 30.3 g/dL (ref 30.0–36.0)
MCV: 83.7 fL (ref 80.0–100.0)
Platelets: 261 10*3/uL (ref 150–400)
RBC: 4.42 MIL/uL (ref 3.87–5.11)
RDW: 18.1 % — ABNORMAL HIGH (ref 11.5–15.5)
WBC: 9.4 10*3/uL (ref 4.0–10.5)
nRBC: 0 % (ref 0.0–0.2)

## 2021-10-10 LAB — COMPREHENSIVE METABOLIC PANEL
ALT: 19 U/L (ref 0–44)
AST: 40 U/L (ref 15–41)
Albumin: 2.4 g/dL — ABNORMAL LOW (ref 3.5–5.0)
Alkaline Phosphatase: 259 U/L — ABNORMAL HIGH (ref 38–126)
Anion gap: 8 (ref 5–15)
BUN: 6 mg/dL — ABNORMAL LOW (ref 8–23)
CO2: 28 mmol/L (ref 22–32)
Calcium: 9.1 mg/dL (ref 8.9–10.3)
Chloride: 103 mmol/L (ref 98–111)
Creatinine, Ser: 0.59 mg/dL (ref 0.44–1.00)
GFR, Estimated: >60 mL/min (ref >60)
Glucose, Bld: 110 mg/dL — ABNORMAL HIGH (ref 70–99)
Potassium: 3.3 mmol/L — ABNORMAL LOW (ref 3.5–5.1)
Sodium: 139 mmol/L (ref 135–145)
Total Bilirubin: 0.7 mg/dL (ref 0.3–1.2)
Total Protein: 6.4 g/dL — ABNORMAL LOW (ref 6.5–8.1)

## 2021-10-10 MED ORDER — POTASSIUM CHLORIDE CRYS ER 20 MEQ PO TBCR
60.0000 meq | EXTENDED_RELEASE_TABLET | Freq: Once | ORAL | Status: AC
Start: 2021-10-10 — End: 2021-10-10
  Administered 2021-10-10: 60 meq via ORAL
  Filled 2021-10-10: qty 3

## 2021-10-10 MED ORDER — CEFAZOLIN SODIUM-DEXTROSE 2-4 GM/100ML-% IV SOLN: 2.0000 g | INTRAVENOUS | Status: AC

## 2021-10-10 MED ORDER — ENOXAPARIN SODIUM 40 MG/0.4ML IJ SOSY
40.0000 mg | PREFILLED_SYRINGE | INTRAMUSCULAR | Status: DC
Start: 2021-10-12 — End: 2021-10-21
  Administered 2021-10-12 – 2021-10-20 (×9): 40 mg via SUBCUTANEOUS
  Filled 2021-10-10 (×9): qty 0.4

## 2021-10-10 MED ORDER — CHLORHEXIDINE GLUCONATE CLOTH 2 % EX PADS
6.0000 | MEDICATED_PAD | Freq: Every day | CUTANEOUS | Status: DC
  Administered 2021-10-10 – 2021-10-21 (×12): 6 via TOPICAL

## 2021-10-10 NOTE — Care Management Important Message (Signed)
Important Message  Patient Details IM Letter given to the Patient. Name: Jane Kennedy MRN: [id number] Date of Birth: 09-28-1945   Medicare Important Message Given:  Yes     Kerin Salen 10/10/2021, 11:15 AM

## 2021-10-10 NOTE — Progress Notes (Signed)
Progress Note   Patient: Frank Pilger JJO:[id number] DOB: 02-15-1946 DOA: 09/30/2021     9 DOS: the patient was seen and examined on 10/10/2021   Brief hospital course: 76 year old  f w/ actinic keratosis, benign neoplasm of large intestine, carpal tunnel syndrome, DDD DDD lumbar sacral, diverticulosis, GERD, hyperlipidemia, IBS, osteopenia, vertigo, vitamin D deficiency presented with nausea vomiting, abdominal pain and elevated calcium level.  Evaluated outside facility was diagnosed with UTI, antibiotics prescribed.  She was not able to fill out prescription.She was found to have potassium of 2.3, magnesium 1.7, phosphorus 2.6, calcium 12.1.  Patient admitted for further treatment of hypercalcemia, hypokalemia in the setting of malignancy. She has had worsening of her confusion and delirium agitated at times with tremors, was lethargic 6/6-CT head, ABG unrevealing. Suspect delirium multifactorial  related to acute illness, initial hypercalcemia, medications (receiving opioids, ativan), increase ammonia  level, started on lactulose enema.she underwent liver biopsy 6/07.  Followed by Dr. Marin Olp  Assessment and Plan: No notes have been filed under this hospital service. Service: Hospitalist Resolved hypercalcemia:  Likely related to her malignancy in the setting of renal mass/lytic lesion on the bone.  Received Zometa and calcitonin, calcium now stable at 9.1 -repeat bmet in AM   Metastatic renal cell carcinoma She underwent liver biopsy on 10/02/2021 with findings consistent with metastatic renal cell carcinoma. Followed by Dr. Marin Olp Now s/p port Placement for chemotherapy on 6/14   Pathologic fracture L2 vertebrae Plan for kyphoplasty on 10/11/2021 by IR. Pain management and bowel regimen in place.   Resolved hyperammonemia Ammonia level improved with lactulose Goal 2-3 stools per day   Hypokalemia:  Replaced Repeat bmet in AM   Elevated liver chemistries Alkaline phosphatase, AST  ALT uptrending. Cont to follow LFT trends   Resolved acute metabolic encephalopathy suspect secondary to hyper ammonemia:  Continue delirium and fall precautions Treat underlying conditions. Conversing appropriately this AM   GERD/indigestion:  Continue p.o. PPI twice daily. Continue supportive care as needed.   Hyperlipidemia: Home statin on hold due to elevated liver chemistries   Left kidney mass Diffuse liver mets, lytic bone metastasis involving L2 vertebra: Underwent liver biopsy 6/7 with findings as stated above,  IR consulted kyphoplasty evaluation.   IR kyphoplasty planned for 10/11/2021   Moderate protein malnutrition:  Liberalize diet Dietitian consulted   Anemia iron deficiency  IV iron, S/P 2 unit PRBC 6/7 monitor Hemoglobin 11.2 No overt bleeding reported      Subjective: Complains of increased pain this afternoon, asking for analgesia. Did not sleep well last night  Physical Exam: Vitals:   10/09/21 1625 10/09/21 2036 10/10/21 0521 10/10/21 1523  BP: (!) 137/59 (!) 136/57 (!) 135/58 (!) 128/56  Pulse: (!) 102 100 99 96  Resp: '19 16 18   '$ Temp:  97.8 F (36.6 C) 98.6 F (37 C) 98.4 F (36.9 C)  TempSrc:  Oral Oral Oral  SpO2: 97% 96% 96% 98%  Weight:      Height:       General exam: Conversant, in no acute distress Respiratory system: normal chest rise, clear, no audible wheezing Cardiovascular system: regular rhythm, s1-s2 Gastrointestinal system: Nondistended, nontender, pos BS Central nervous system: No seizures, no tremors Extremities: No cyanosis, no joint deformities Skin: No rashes, no pallor Psychiatry: Affect normal // no auditory hallucinations   Data Reviewed:  Labs reviewed: Na 139, K 3.3, Cr 0.59  Family Communication: Pt in room, family at bedside  Disposition: Status is: Inpatient Remains inpatient appropriate because:  Severity of illness  Planned Discharge Destination: Home    Author: Marylu Lund, MD 10/10/2021 6:48  PM  For on call review [email or web address].

## 2021-10-10 NOTE — Progress Notes (Signed)
Overall, Ms. Din seems to be doing a little bit better.  She seems to be a little more alert.  She is off the Duragesic patch.  She had her Port-A-Cath placed yesterday.  As always, Interventional Radiology does a fantastic job.  She is going for the kyphoplasty tomorrow.  Hopefully, this will do quite a bit for her.  She has had no problems with cough or shortness of breath.  There is no nausea or vomiting.  She, I think has been going to the bathroom.  Her CBC that came back this morning showed a white cell count 9.4.  Hemoglobin 11.2.  Platelet count 261,000.  She does seem to be eating pretty well.  I know nutrition is can be critical for her.  Her vital signs show a temperature of 98.6.  Pulse 99.  Blood pressure 135/58.  Her lungs are clear.  Cardiac exam regular rate and rhythm.  Abdomen is soft.  Bowel sounds are present.  There is no fluid wave.  Extremity shows no clubbing, cyanosis or edema.  Neurological exam shows no focal deficits.  Again, she seems to be more alert and oriented.  Nutrition is going to be key.  Hopefully, we will be able to continue to get good calories into her.  She has a Port-A-Cath in place.  I would consider her for double immunotherapy.  I will consider her for Yervoy/nivolumab.  We can try to get this set up as an outpatient.  Hopefully, the kyphoplasty will really help with some of the pain.  Hopefully she will be able to be more mobile and be more ambulatory.  I know that she is gotten incredible care from all the staff on 5 E.  I appreciate all of their efforts.  Lattie Haw, MD  [facility] 1:7

## 2021-10-11 ENCOUNTER — Inpatient Hospital Stay (HOSPITAL_COMMUNITY): Payer: Medicare HMO

## 2021-10-11 ENCOUNTER — Encounter: Payer: Self-pay | Admitting: *Deleted

## 2021-10-11 DIAGNOSIS — K219 Gastro-esophageal reflux disease without esophagitis: Secondary | ICD-10-CM | POA: Diagnosis not present

## 2021-10-11 DIAGNOSIS — G9341 Metabolic encephalopathy: Secondary | ICD-10-CM | POA: Diagnosis not present

## 2021-10-11 HISTORY — PX: IR KYPHO LUMBAR INC FX REDUCE BONE BX UNI/BIL CANNULATION INC/IMAGING: IMG5519

## 2021-10-11 HISTORY — PX: IR BONE TUMOR(S)RF ABLATION: IMG2284

## 2021-10-11 LAB — CBC WITH DIFFERENTIAL/PLATELET
Abs Immature Granulocytes: 0.07 10*3/uL (ref 0.00–0.07)
Basophils Absolute: 0 10*3/uL (ref 0.0–0.1)
Basophils Relative: 0 %
Eosinophils Absolute: 0.2 10*3/uL (ref 0.0–0.5)
Eosinophils Relative: 2 %
HCT: 35.3 % — ABNORMAL LOW (ref 36.0–46.0)
Hemoglobin: 10.9 g/dL — ABNORMAL LOW (ref 12.0–15.0)
Immature Granulocytes: 1 %
Lymphocytes Relative: 16 %
Lymphs Abs: 1.5 10*3/uL (ref 0.7–4.0)
MCH: 25.7 pg — ABNORMAL LOW (ref 26.0–34.0)
MCHC: 30.9 g/dL (ref 30.0–36.0)
MCV: 83.3 fL (ref 80.0–100.0)
Monocytes Absolute: 0.9 10*3/uL (ref 0.1–1.0)
Monocytes Relative: 10 %
Neutro Abs: 6.7 10*3/uL (ref 1.7–7.7)
Neutrophils Relative %: 71 %
Platelets: 247 10*3/uL (ref 150–400)
RBC: 4.24 MIL/uL (ref 3.87–5.11)
RDW: 18.3 % — ABNORMAL HIGH (ref 11.5–15.5)
WBC: 9.3 10*3/uL (ref 4.0–10.5)
nRBC: 0 % (ref 0.0–0.2)

## 2021-10-11 LAB — COMPREHENSIVE METABOLIC PANEL
ALT: 17 U/L (ref 0–44)
AST: 36 U/L (ref 15–41)
Albumin: 2.3 g/dL — ABNORMAL LOW (ref 3.5–5.0)
Alkaline Phosphatase: 242 U/L — ABNORMAL HIGH (ref 38–126)
Anion gap: 5 (ref 5–15)
BUN: 6 mg/dL — ABNORMAL LOW (ref 8–23)
CO2: 26 mmol/L (ref 22–32)
Calcium: 9.8 mg/dL (ref 8.9–10.3)
Chloride: 109 mmol/L (ref 98–111)
Creatinine, Ser: 0.58 mg/dL (ref 0.44–1.00)
GFR, Estimated: >60 mL/min (ref >60)
Glucose, Bld: 123 mg/dL — ABNORMAL HIGH (ref 70–99)
Potassium: 4.5 mmol/L (ref 3.5–5.1)
Sodium: 140 mmol/L (ref 135–145)
Total Bilirubin: 0.8 mg/dL (ref 0.3–1.2)
Total Protein: 5.9 g/dL — ABNORMAL LOW (ref 6.5–8.1)

## 2021-10-11 LAB — LACTATE DEHYDROGENASE: LDH: 510 U/L — ABNORMAL HIGH (ref 98–192)

## 2021-10-11 MED ORDER — GELATIN ABSORBABLE 12-7 MM EX MISC
CUTANEOUS | Status: AC
  Filled 2021-10-11: qty 1

## 2021-10-11 MED ORDER — GELATIN ABSORBABLE 12-7 MM EX MISC
CUTANEOUS | Status: AC
  Filled 2021-10-11: qty 3

## 2021-10-11 MED ORDER — MIDAZOLAM HCL 2 MG/2ML IJ SOLN
INTRAMUSCULAR | Status: AC | PRN
  Administered 2021-10-11: 1 mg via INTRAVENOUS

## 2021-10-11 MED ORDER — MIDAZOLAM HCL 2 MG/2ML IJ SOLN
INTRAMUSCULAR | Status: AC | PRN
  Administered 2021-10-11: .5 mg via INTRAVENOUS

## 2021-10-11 MED ORDER — FENTANYL CITRATE (PF) 100 MCG/2ML IJ SOLN
INTRAMUSCULAR | Status: AC | PRN
  Administered 2021-10-11: 25 ug via INTRAVENOUS

## 2021-10-11 MED ORDER — FENTANYL CITRATE (PF) 100 MCG/2ML IJ SOLN
INTRAMUSCULAR | Status: AC | PRN
  Administered 2021-10-11: 50 ug via INTRAVENOUS

## 2021-10-11 MED ORDER — BUPIVACAINE HCL (PF) 0.5 % IJ SOLN
INTRAMUSCULAR | Status: AC | PRN
  Administered 2021-10-11: 15 mL

## 2021-10-11 MED ORDER — CEFAZOLIN SODIUM-DEXTROSE 2-4 GM/100ML-% IV SOLN
INTRAVENOUS | Status: AC
  Administered 2021-10-11: 2 g via INTRAVENOUS
  Filled 2021-10-11: qty 100

## 2021-10-11 MED ORDER — FENTANYL CITRATE (PF) 100 MCG/2ML IJ SOLN
INTRAMUSCULAR | Status: AC
  Filled 2021-10-11: qty 2

## 2021-10-11 MED ORDER — BUPIVACAINE HCL (PF) 0.5 % IJ SOLN
INTRAMUSCULAR | Status: AC
  Filled 2021-10-11: qty 30

## 2021-10-11 MED ORDER — LIDOCAINE HCL (PF) 1 % IJ SOLN
INTRAMUSCULAR | Status: AC
  Filled 2021-10-11: qty 30

## 2021-10-11 MED ORDER — MIDAZOLAM HCL 2 MG/2ML IJ SOLN
INTRAMUSCULAR | Status: AC
  Filled 2021-10-11: qty 2

## 2021-10-11 MED ORDER — LIDOCAINE HCL (PF) 1 % IJ SOLN
INTRAMUSCULAR | Status: AC | PRN
  Administered 2021-10-11: 15 mL via SUBCUTANEOUS

## 2021-10-11 NOTE — Progress Notes (Signed)
Progress Note   Patient: Jane Kennedy JSE:[id number] DOB: 08-31-1945 DOA: 09/30/2021     10 DOS: the patient was seen and examined on 10/11/2021   Brief hospital course: 76 year old  f w/ actinic keratosis, benign neoplasm of large intestine, carpal tunnel syndrome, DDD DDD lumbar sacral, diverticulosis, GERD, hyperlipidemia, IBS, osteopenia, vertigo, vitamin D deficiency presented with nausea vomiting, abdominal pain and elevated calcium level.  Evaluated outside facility was diagnosed with UTI, antibiotics prescribed.  She was not able to fill out prescription.She was found to have potassium of 2.3, magnesium 1.7, phosphorus 2.6, calcium 12.1.  Patient admitted for further treatment of hypercalcemia, hypokalemia in the setting of malignancy. She has had worsening of her confusion and delirium agitated at times with tremors, was lethargic 6/6-CT head, ABG unrevealing. Suspect delirium multifactorial  related to acute illness, initial hypercalcemia, medications (receiving opioids, ativan), increase ammonia  level, started on lactulose enema.she underwent liver biopsy 6/07.  Followed by Dr. Marin Olp  Assessment and Plan: No notes have been filed under this hospital service. Service: Hospitalist Resolved hypercalcemia:  Likely related to her malignancy in the setting of renal mass/lytic lesion on the bone.  Received Zometa and calcitonin, calcium this AM 9.8 -repeat bmet in AM   Metastatic renal cell carcinoma She underwent liver biopsy on 10/02/2021 with findings consistent with metastatic renal cell carcinoma. Followed by Dr. Marin Olp Now s/p port Placement for chemotherapy on 6/14   Pathologic fracture L2 vertebrae Pt now s/p kyphoplasty on 10/11/2021 by IR Pain management and bowel regimen in place.   Resolved hyperammonemia Ammonia level improved with lactulose Goal 2-3 stools per day   Hypokalemia:  Replaced Repeat bmet in AM   Elevated liver chemistries Alkaline phosphatase, AST ALT  uptrending. Cont to follow LFT trends   Resolved acute metabolic encephalopathy suspect secondary to hyper ammonemia:  Continue delirium and fall precautions Treat underlying conditions. Conversing appropriately this AM   GERD/indigestion:  Continue p.o. PPI twice daily. Continue supportive care as needed.   Hyperlipidemia: Home statin on hold due to elevated liver chemistries   Left kidney mass Diffuse liver mets, lytic bone metastasis involving L2 vertebra: Underwent liver biopsy 6/7 with findings as stated above,  IR kyphoplasty done 10/11/2021   Moderate protein malnutrition:  Liberalize diet Dietitian consulted   Anemia iron deficiency  IV iron, S/P 2 unit PRBC 6/7 monitor Hemoglobin 10.9 No overt bleeding reported      Subjective: Reports back pains this AM. Pt was seen prior to planned kyphoplasty  Physical Exam: Vitals:   10/11/21 1648 10/11/21 1649 10/11/21 1650 10/11/21 1655  BP:   130/65 126/66  Pulse: 99 98 97 99  Resp: '17 15 15 15  '$ Temp:      TempSrc:      SpO2: 99% 99% 99% 98%  Weight:      Height:       General exam: Awake, laying in bed, in nad Respiratory system: Normal respiratory effort, no wheezing Cardiovascular system: regular rate, s1, s2 Gastrointestinal system: Soft, nondistended, positive BS Central nervous system: CN2-12 grossly intact, strength intact Extremities: Perfused, no clubbing Skin: Normal skin turgor, no notable skin lesions seen Psychiatry: Mood normal // no visual hallucinations   Data Reviewed:  Labs reviewed: Na 140, K 4.5, Cr 0.58  Family Communication: Pt in room, family at bedside  Disposition: Status is: Inpatient Remains inpatient appropriate because: Severity of illness  Planned Discharge Destination: Home    Author: Marylu Lund, MD 10/11/2021 4:59 PM  For on  call review [email or web address].

## 2021-10-11 NOTE — Procedures (Signed)
Vascular and Interventional Radiology Procedure Note  Patient: Jane Kennedy DOB: Apr 09, 1946 Medical Record Number: [id number] Note Date/Time: 10/11/21 5:46 PM   Performing Physician: Michaelle Birks, MD Assistant(s): None  Diagnosis: metastatic RCC w symptomatic L2 osseous metastasis and pathologic fracture   Procedure:  L2 PERCUTANEOUS RADIOFREQUENCY "OsteoCool" ABLATION and VERTEBRAL KYPHOPLASTY  Anesthesia: Conscious Sedation Complications: None Estimated Blood Loss:  25 mL Specimens: Sent for None  Findings:  Successful Fluoroscopy-guided L2 "OsteoCool" RFA and bipedicular kyphoplasty. A total of 8 mL PMMA was used. Hemostasis of the tract was achieved using Manual Pressure.   Plan: Bed rest for 2 hours.  See detailed procedure note with images in PACS. The patient tolerated the procedure well without incident or complication and was returned to Floor Bed in stable condition.    Michaelle Birks, MD [facility] Rad[facility]   Pager. [place]

## 2021-10-11 NOTE — Progress Notes (Signed)
Patient continues to be hospitalized. Will continue to follow for post discharge needs and office follow up.   Oncology Nurse Navigator Documentation     10/11/2021    8:15 AM  Oncology Nurse Navigator Flowsheets  Navigator Follow Up Date: 10/15/2021  Navigator Follow Up Reason: Appointment Review  Navigator Location [facility]  Navigator Encounter Type Appt/Treatment Plan Review  Patient Visit Type MedOnc  Treatment Phase Pre-Tx/Tx Discussion  Barriers/Navigation Needs Coordination of Care;Education;Family Concerns  Interventions None Required  Acuity Level 2-Minimal Needs (1-2 Barriers Identified)  Support Groups/Services Friends and Family  Time Spent with Patient 15

## 2021-10-11 NOTE — Progress Notes (Signed)
Overall, Jane Kennedy is about the same.  She is going to the kyphoplasty today.  As always, I appreciate Interventional Radiology's help with all of this.  I think she is eating pretty well.  I know her albumin is still on the lower side.  I think it was 2.3 today.  She is not complaining of any pain.  There is no nausea or vomiting.  She does not have any kind of issues with reflux.  Her labs show white count 9.3.  Hemoglobin 10.9.  Platelet count 247,000.  Her alkaline phosphatase is 242.  The albumin is 2.3.  Her calcium is 9.8.  Her BUN is 6 with creatinine of 0.58.  She has had no problems with bleeding.  There has been no fever.  There is no cough or shortness of breath.  Her vital signs are temperature 99.  Pulse 110.  Blood pressure 138/74.  Her head neck exam shows no ocular or oral lesions.  She has no adenopathy in the neck.  Lungs are clear.  Cardiac exam is tachycardic but regular.  Abdomen is soft.  Bowel sounds are present.  There is no fluid wave.  There is no guarding or rebound.  Extremities shows no clubbing, cyanosis or edema.  Neurological exam is nonfocal.  Jane Kennedy has the metastatic renal cell carcinoma.  She is going to have her kyphoplasty today.  Hopefully, this will help with her back and give some strength to her back so she can be more independent.  We will have to watch her calcium.  I noted that it might be trending upward.  We will have to watch this closely.  Given her low albumin, the actual calcium is probably higher.  It would not be nice if she could go home soon.  We really need to institute systemic therapy on her.  She will also need radiotherapy to her back.  I know that she is getting incredible care from all the staff on [facility] [address].  I appreciate their compassion.  Lattie Haw, MD  1 Thessalonians 5:16-18

## 2021-10-12 DIAGNOSIS — K219 Gastro-esophageal reflux disease without esophagitis: Secondary | ICD-10-CM | POA: Diagnosis not present

## 2021-10-12 LAB — COMPREHENSIVE METABOLIC PANEL
ALT: 15 U/L (ref 0–44)
AST: 50 U/L — ABNORMAL HIGH (ref 15–41)
Albumin: 2.5 g/dL — ABNORMAL LOW (ref 3.5–5.0)
Alkaline Phosphatase: 267 U/L — ABNORMAL HIGH (ref 38–126)
Anion gap: 10 (ref 5–15)
BUN: 7 mg/dL — ABNORMAL LOW (ref 8–23)
CO2: 24 mmol/L (ref 22–32)
Calcium: 10 mg/dL (ref 8.9–10.3)
Chloride: 107 mmol/L (ref 98–111)
Creatinine, Ser: 0.62 mg/dL (ref 0.44–1.00)
GFR, Estimated: >60 mL/min (ref >60)
Glucose, Bld: 100 mg/dL — ABNORMAL HIGH (ref 70–99)
Potassium: 4.4 mmol/L (ref 3.5–5.1)
Sodium: 141 mmol/L (ref 135–145)
Total Bilirubin: 0.7 mg/dL (ref 0.3–1.2)
Total Protein: 6 g/dL — ABNORMAL LOW (ref 6.5–8.1)

## 2021-10-12 MED ORDER — ORAL CARE MOUTH RINSE
15.0000 mL | OROMUCOSAL | Status: DC
  Administered 2021-10-12 – 2021-10-21 (×25): 15 mL via OROMUCOSAL

## 2021-10-12 MED ORDER — LACTATED RINGERS IV SOLN: INTRAVENOUS | Status: DC

## 2021-10-12 MED ORDER — SODIUM CHLORIDE 0.9% FLUSH
10.0000 mL | Freq: Two times a day (BID) | INTRAVENOUS | Status: DC
  Administered 2021-10-12 – 2021-10-20 (×13): 10 mL

## 2021-10-12 MED ORDER — ORAL CARE MOUTH RINSE: 15.0000 mL | OROMUCOSAL | Status: DC | PRN

## 2021-10-12 MED ORDER — SODIUM CHLORIDE 0.9% FLUSH: 10.0000 mL | INTRAVENOUS | Status: DC | PRN

## 2021-10-12 NOTE — Progress Notes (Signed)
Progress Note   Patient: Jane Kennedy TMH:[id number] DOB: 09-06-1945 DOA: 09/30/2021     11 DOS: the patient was seen and examined on 10/12/2021   Brief hospital course: 76 year old  f w/ actinic keratosis, benign neoplasm of large intestine, carpal tunnel syndrome, DDD DDD lumbar sacral, diverticulosis, GERD, hyperlipidemia, IBS, osteopenia, vertigo, vitamin D deficiency presented with nausea vomiting, abdominal pain and elevated calcium level.  Evaluated outside facility was diagnosed with UTI, antibiotics prescribed.  She was not able to fill out prescription.She was found to have potassium of 2.3, magnesium 1.7, phosphorus 2.6, calcium 12.1.  Patient admitted for further treatment of hypercalcemia, hypokalemia in the setting of malignancy. She has had worsening of her confusion and delirium agitated at times with tremors, was lethargic 6/6-CT head, ABG unrevealing. Suspect delirium multifactorial  related to acute illness, initial hypercalcemia, medications (receiving opioids, ativan), increase ammonia  level, started on lactulose enema.she underwent liver biopsy 6/07.  Followed by Dr. Marin Olp  Assessment and Plan: No notes have been filed under this hospital service. Service: Hospitalist Hypercalcemia:  Likely related to her malignancy in the setting of renal mass/lytic lesion on the bone.  Received Zometa and calcitonin, calcium this AM up to 10.0, corrected to 11.2 given low albumin -Start on LR at 125cc/hr -repeat bmet in AM   Metastatic renal cell carcinoma She underwent liver biopsy on 10/02/2021 with findings consistent with metastatic renal cell carcinoma. Followed by Dr. Marin Olp Now s/p port Placement for chemotherapy on 6/14   Pathologic fracture L2 vertebrae Pt now s/p kyphoplasty on 10/11/2021 by IR Pain management and bowel regimen in place. -HHPT recommended by PT   Resolved hyperammonemia Ammonia level improved with lactulose Goal 2-3 stools per day   Hypokalemia:   Replaced Cont to follow lytes   Elevated liver chemistries Alkaline phosphatase, AST ALT uptrending. Cont to follow LFT trends   Resolved acute metabolic encephalopathy suspect secondary to hyper ammonemia:  Continue delirium and fall precautions Treat underlying conditions. Conversing appropriately this AM   GERD/indigestion:  Continue p.o. PPI twice daily. Continue supportive care as needed.   Hyperlipidemia: Home statin on hold due to elevated liver chemistries   Left kidney mass Diffuse liver mets, lytic bone metastasis involving L2 vertebra: Underwent liver biopsy 6/7 with findings as stated above,  IR kyphoplasty done 10/11/2021   Moderate protein malnutrition:  Liberalize diet Dietitian consulted   Anemia iron deficiency  IV iron, S/P 2 unit PRBC 6/7 monitor Hemoglobin 10.9 on most recent check No overt bleeding reported      Subjective: States back pain is improved markedly since kyphoplasty  Physical Exam: Vitals:   10/11/21 1741 10/11/21 2055 10/12/21 0422 10/12/21 1718  BP: 128/61 124/64 127/64 (!) 124/57  Pulse: 100 (!) 109 (!) 103 100  Resp: '14 17 20 16  '$ Temp: 99.3 F (37.4 C) 99.2 F (37.3 C) 99.1 F (37.3 C) 98.4 F (36.9 C)  TempSrc:      SpO2: 95% 96% 95% 97%  Weight:      Height:       General exam: Conversant, in no acute distress Respiratory system: normal chest rise, clear, no audible wheezing Cardiovascular system: regular rhythm, s1-s2 Gastrointestinal system: Nondistended, nontender, pos BS Central nervous system: No seizures, no tremors Extremities: No cyanosis, no joint deformities Skin: No rashes, no pallor Psychiatry: Affect normal // no auditory hallucinations   Data Reviewed:  Labs reviewed: Na 141, K 4.4, Cr 0.62  Family Communication: Pt in room, family at bedside  Disposition:  Status is: Inpatient Remains inpatient appropriate because: Severity of illness  Planned Discharge Destination:  Home    Author: Marylu Lund, MD 10/12/2021 5:59 PM  For on call review [email or web address].

## 2021-10-12 NOTE — Progress Notes (Signed)
Physical Therapy Treatment Patient Details Name: Jane Kennedy MRN: [id number] DOB: 01-Jan-1946 Today's Date: 10/12/2021   History of Present Illness 76 year old  f w/ actinic keratosis, benign neoplasm of large intestine, carpal tunnel syndrome, DDD lumbar sacral, diverticulosis, GERD, hyperlipidemia, IBS, osteopenia, vertigo, vitamin D deficiency presented with nausea, vomiting, abdominal pain and elevated calcium level.   Evaluated outside facility was diagnosed with UTI, antibiotics prescribed.  She was not able to fill out prescription.  She was found to have potassium of 2.3, magnesium 1.7, phosphorus 2.6, calcium 12.1.  Patient admitted for further treatment of hypercalcemia, hypokalemia in the setting of malignancy.  She has had worsening of confusion and delirium, agitated at times with tremors, was lethargic 6/6-CT head, ABG unrevealing. Suspect delirium multifactorial  related to acute illness, initial hypercalcemia, medications (receiving opioids, ativan), increase ammonia  level, started on lactulose.  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  Followed by Dr. Marin Olp.  Pt with Pathologic fracture L2 vertebrae now s/p kyphoplasty on 10/11/2021 by IR    PT Comments    Pt assisted with ambulating short distance in hallway and limited by fatigue.  Pt typically also limited by vertigo however no complaints of vertigo this session.  Pt's daughter present and reports they are getting pt an adjustable bed for home.  Recommend return home only if assist available at home (see "return home with the following" section).  Otherwise, Pt may benefit from SNF.  Pt left in recliner (able to sit cross legged) with lunch, and family and RN in room.   Recommendations for follow up therapy are one component of a multi-disciplinary discharge planning process, led by the attending physician.  Recommendations may be updated based on patient status, additional  functional criteria and insurance authorization.  Follow Up Recommendations  Home health PT     Assistance Recommended at Discharge Frequent or constant Supervision/Assistance  Patient can return home with the following A lot of help with walking and/or transfers;A lot of help with bathing/dressing/bathroom;Assistance with cooking/housework;Assistance with feeding;Direct supervision/assist for medications management;Direct supervision/assist for financial management;Assist for transportation;Help with stairs or ramp for entrance   Equipment Recommendations  Rolling walker (2 wheels)    Recommendations for Other Services       Precautions / Restrictions Precautions Precautions: Back;Fall Precaution Comments: hx vertigo, back for comfort s/p KP     Mobility  Bed Mobility Overal bed mobility: Needs Assistance Bed Mobility: Rolling, Sidelying to Sit Rolling: Min guard Sidelying to sit: Min assist       General bed mobility comments: reminded pt to perform log roll technique since s/p KP to L2 comp fx, increased time required, assist for trunk upright    Transfers Overall transfer level: Needs assistance Equipment used: Rolling walker (2 wheels) Transfers: Sit to/from Stand Sit to Stand: Min guard           General transfer comment: verbal cues for hand placement and utilizing LEs to "power up"; pt not c/o vertigo this session however has hx of vertigo and had vertigo last session per notes    Ambulation/Gait Ambulation/Gait assistance: Min assist, Mod assist, +2 safety/equipment Gait Distance (Feet): 45 Feet Assistive device: Rolling walker (2 wheels) Gait Pattern/deviations: Step-through pattern, Decreased stride length       General Gait Details: multimodal cues and max cues for RW positioning; pt with one instance of LOB requiring mod assist to correct and prevent fall, recliner following for safety and pt fatigued quickly   Stairs  Wheelchair Mobility    Modified Rankin (Stroke Patients Only)       Balance Overall balance assessment: Needs assistance         Standing balance support: Bilateral upper extremity supported, During functional activity, Reliant on assistive device for balance Standing balance-Leahy Scale: Poor                              Cognition Arousal/Alertness: Awake/alert Behavior During Therapy: WFL for tasks assessed/performed Overall Cognitive Status: Within Functional Limits for tasks assessed                                          Exercises      General Comments        Pertinent Vitals/Pain Pain Assessment Pain Assessment: Faces Faces Pain Scale: Hurts little more Pain Descriptors / Indicators: Aching, Sore Pain Intervention(s): Premedicated before session, Repositioned, Monitored during session    Home Living                          Prior Function            PT Goals (current goals can now be found in the care plan section) Acute Rehab PT Goals PT Goal Formulation: With patient/family Time For Goal Achievement: 10/25/21 Potential to Achieve Goals: Good Progress towards PT goals: Progressing toward goals    Frequency    Min 2X/week      PT Plan Current plan remains appropriate    Co-evaluation              AM-PAC PT "6 Clicks" Mobility   Outcome Measure  Help needed turning from your back to your side while in a flat bed without using bedrails?: A Little Help needed moving from lying on your back to sitting on the side of a flat bed without using bedrails?: A Little Help needed moving to and from a bed to a chair (including a wheelchair)?: A Lot Help needed standing up from a chair using your arms (e.g., wheelchair or bedside chair)?: A Lot Help needed to walk in hospital room?: A Lot Help needed climbing 3-5 steps with a railing? : Total 6 Click Score: 13    End of Session Equipment Utilized  During Treatment: Gait belt Activity Tolerance: Patient limited by fatigue Patient left: with call bell/phone within reach;in chair;with chair alarm set;with family/visitor present;with nursing/sitter in room Nurse Communication: Mobility status PT Visit Diagnosis: Muscle weakness (generalized) (M62.81);Other abnormalities of gait and mobility (R26.89)     Time: 2820-6015 PT Time Calculation (min) (ACUTE ONLY): 22 min  Charges:  $Gait Training: 8-22 mins                    Jannette Spanner PT, DPT Acute Rehabilitation Services Pager: [phone] Office: [place] 10/12/2021, 3:40 PM

## 2021-10-12 NOTE — Progress Notes (Signed)
Supervising Physician: Ruthann Cancer  Patient Status:  [facility] - In-pt  Chief Complaint:  1 day s/p L2 percutaneous radiofrequency "OsteoCool" ablation and vertebral kyphoplasty  Subjective:  Currently struggling with vertigo and nausea, curled up in bed.  Denies fever/chills Has walked the hall this morning and ambulates in the room.  Pain improved from pre-procedure.     Allergies: Codeine and Nickel  Medications: Prior to Admission medications   Medication Sig Start Date End Date Taking? Authorizing Provider  acetaminophen (TYLENOL) 325 MG tablet Take 325 mg by mouth once. pain   Yes [provider]  amitriptyline (ELAVIL) 100 MG tablet Take 100 mg by mouth daily.   Yes [provider]  atorvastatin (LIPITOR) 40 MG tablet Take 40 mg by mouth daily. 09/18/21  Yes [provider]  Calcium Carbonate Antacid (TUMS PO) Take 2 tablets by mouth daily as needed (acid reflux).   Yes [provider]  ibuprofen (ADVIL) 200 MG tablet Take 200-400 mg by mouth 2 (two) times daily as needed for moderate pain.   Yes [provider]  lidocaine (XYLOCAINE) 5 % ointment Apply 1 application. topically 2 (two) times daily as needed (back pain).   Yes [provider]  omeprazole (PRILOSEC) 20 MG capsule Take 20 mg by mouth daily. 07/01/21  Yes [provider]  ondansetron (ZOFRAN) 8 MG tablet Take 1 tablet (8 mg total) by mouth every 8 (eight) hours as needed for nausea or vomiting. 09/20/21  Yes Ennever, Rudell Cobb, MD  cephALEXin (KEFLEX) 500 MG capsule Take 500 mg by mouth 2 (two) times daily. Patient not taking: Reported on 09/30/2021 09/29/21   [provider]  HYDROcodone-acetaminophen (NORCO/VICODIN) 5-325 MG tablet Take 1-2 tablets by mouth every 4 (four) hours as needed for moderate pain. Max 4-6 tabs a day Patient not taking: Reported on 09/30/2021 09/17/21   Volanda Napoleon, MD  Ibuprofen-diphenhydrAMINE HCl (IBUPROFEN PM) 200-25 MG  CAPS Take 1 tablet by mouth at bedtime as needed (sleep). Patient not taking: Reported on 09/30/2021    [provider]  ondansetron (ZOFRAN-ODT) 4 MG disintegrating tablet Take by mouth. Patient not taking: Reported on 09/30/2021 09/29/21   [provider]  traMADol Veatrice Bourbon) 50 MG tablet  09/29/21   [provider]     Vital Signs: BP 127/64 (BP Location: Right Arm)   Pulse (!) 103   Temp 99.1 F (37.3 C)   Resp 20   Ht '5\' 3"'$  (1.6 m)   Wt 132 lb 3.2 oz (60 kg)   SpO2 95%   BMI 23.42 kg/m   Physical Exam Constitutional:      General: She is in acute distress.     Appearance: She is ill-appearing.  Cardiovascular:     Rate and Rhythm: Tachycardia present.  Skin:    General: Skin is warm and dry.     Comments: Bilateral dressings over the KP access sites.  Skin is soft and there is no hematoma, erythema, or drainage.    Neurological:     General: No focal deficit present.     Sensory: Sensation is intact.     Motor: Motor function is intact.     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 Patient with lower  back discomfort and MRI consistent with pathologic fracture at L2. As such, patient presents today for L2 vertebral body ablation and kyphoplasty / cement augmentation. EXAM: FLUOROSCOPIC GUIDED L2 VERTEBRAL BODY RADIOFREQUENCY (OsteoCool) ABLATION and BALLOON KYPHOPLASTY AUGMENTATION COMPARISON:  MR L-spine, 09/22/2021.  CT AP, 09/10/2021. MEDICATIONS: 1% lidocaine subcutaneous, and 0.25 % bupivacaine IM within the access level paraspinal muscles. Ancef 2 g IV; The antibiotic was administered in an appropriate time interval prior to needle puncture of the skin. ANESTHESIA/SEDATION: Moderate (conscious) sedation was  employed during this procedure. A total of Versed 5.5 mg and Fentanyl 275 mcg was administered intravenously. Moderate Sedation Time: 105 minutes. The patient's level of consciousness and vital signs were monitored continuously by radiology nursing throughout the procedure under my direct supervision. FLUOROSCOPY TIME:  Fluoroscopic dose; 628 mGy COMPLICATIONS: None immediate. TECHNIQUE: Informed written consent was obtained from the patient after a thorough discussion of the procedural risks, benefits and alternatives. All questions were addressed. Maximal Sterile Barrier Technique was utilized including caps, mask, sterile gowns, sterile gloves, sterile drape, hand hygiene and skin antiseptic. A timeout was performed prior to the initiation of the procedure. The patient was placed prone on the fluoroscopic table. The skin overlying the lumbar spine region was then prepped and draped in the usual sterile fashion. Maximal barrier sterile technique was utilized including caps, mask, sterile gowns, sterile gloves, sterile drape, hand hygiene and skin antiseptic. The LEFT pedicle at L2 was then infiltrated with 1% lidocaine followed by the advancement of a Kyphon trocar needle through the LEFT pedicle into the posterior one-third of the vertebral body. Subsequently, the osteo drill was advanced to the anterior third of the vertebral body. The osteo drill was retracted. Through the working cannula, a 12 mm OsteoCoolRF ablation probe was inserted and positioned under fluoroscopic guidance. In similar fashion, the RIGHT L2 pedicle was infiltrated with 1% lidocaine. Utilizing a transpedicular approach, a second Kyphon trocar needle was advanced into the posterior third of the vertebral body. Subsequently, the osteo drill was coaxially advanced to the anterior right third. The osteo drill was exchanged for a 12 mm OsteoCool RF ablation probe which was positioned under fluoroscopic guidance. With both OsteoCool ablation  probes in place, the ablation was performed per protocol. Additionally, tract ablation at the LEFT pedicle was performed. The ablation was performed for approximately 25-30 seconds. Attention was now paid towards the kyphoplasty portion of the procedure. A Kyphon inflatable bone tamp was advanced through both working cannulas and positioned with the distal marker approximately 5 mm from the anterior aspect of the cortex. Appropriate positioning was confirmed on the AP projection. At this time, the balloon was expanded using contrast via a Kyphon inflation syringe device via micro tubing. Inflations were continued under direct fluoroscopic guidance. At this time, methylmethacrylate mixture was reconstituted in the Kyphon bone mixing device system. This was then loaded into the delivery mechanism, attached to Kyphon bone fillers. The balloons were deflated and removed followed by the instillation of methylmethacrylate mixture with excellent filling in the AP and lateral projections. The working cannulae and the bone filler were then retrieved and removed. Multiple spot radiographic images were obtained in various obliquities. Hemostasis was achieved with manual compression. The patient tolerated the procedure well without immediate postprocedural complication. FINDINGS: *Malignant demineralization/erosion with replacement of tumor at the LEFT pedicle. *Successful percutaneous radiofrequency "OsteoCool" ablation of the vertebral body and LEFT pedicular tract ablation. *Adequate cement filling of the L2 vertebral body on both the AP and lateral projections. *  No extravasation was noted in the disk spaces or posteriorly into the spinal canal or along the pedicular access. *No epidural venous contamination was seen. IMPRESSION: Successful L2 vertebral radiofrequency "OsteoCool" vertebral body and LEFT pedicular tract ablation, and bi-pedicular cement augmentation with balloon kyphoplasty, as above. PLAN: The patient will  follow-up with me at the Vascular Interventional Radiology clinic in 4 weeks. Select T8 Michaelle Birks, MD [facility] Radiology Electronically Signed   By: Michaelle Birks M.D.   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 Patient with lower back discomfort and MRI consistent with pathologic fracture at L2. As such, patient presents today for L2 vertebral body ablation and kyphoplasty / cement augmentation. EXAM: FLUOROSCOPIC GUIDED L2 VERTEBRAL BODY RADIOFREQUENCY (OsteoCool) ABLATION and BALLOON KYPHOPLASTY AUGMENTATION COMPARISON:  MR L-spine, 09/22/2021.  CT AP, 09/10/2021. MEDICATIONS: 1% lidocaine subcutaneous, and 0.25 % bupivacaine IM within the access level paraspinal muscles. Ancef 2 g IV; The antibiotic was administered in an appropriate time interval prior to needle puncture of the skin. ANESTHESIA/SEDATION: Moderate (conscious) sedation was employed during this procedure. A total of Versed 5.5 mg and Fentanyl 275 mcg was administered intravenously. Moderate Sedation Time: 105 minutes. The patient's level of consciousness and vital signs were monitored continuously by radiology nursing throughout the procedure under my direct supervision. FLUOROSCOPY TIME:  Fluoroscopic dose; 355 mGy COMPLICATIONS: None immediate. TECHNIQUE: Informed written consent was obtained from the patient after a thorough discussion of the procedural risks, benefits and alternatives. All questions were addressed. Maximal Sterile Barrier Technique was utilized including caps, mask, sterile gowns, sterile gloves, sterile drape, hand hygiene and skin antiseptic. A timeout was performed prior to the initiation of the procedure. The patient was placed prone on the fluoroscopic table. The skin overlying the lumbar spine region was then  prepped and draped in the usual sterile fashion. Maximal barrier sterile technique was utilized including caps, mask, sterile gowns, sterile gloves, sterile drape, hand hygiene and skin antiseptic. The LEFT pedicle at L2 was then infiltrated with 1% lidocaine followed by the advancement of a Kyphon trocar needle through the LEFT pedicle into the posterior one-third of the vertebral body. Subsequently, the osteo drill was advanced to the anterior third of the vertebral body. The osteo drill was retracted. Through the working cannula, a 12 mm OsteoCoolRF ablation probe was inserted and positioned under fluoroscopic guidance. In similar fashion, the RIGHT L2 pedicle was infiltrated with 1% lidocaine. Utilizing a transpedicular approach, a second Kyphon trocar needle was advanced into the posterior third of the vertebral body. Subsequently, the osteo drill was coaxially advanced to the anterior right third. The osteo drill was exchanged for a 12 mm OsteoCool RF ablation probe which was positioned under fluoroscopic guidance. With both OsteoCool ablation probes in place, the ablation was performed per protocol. Additionally, tract ablation at the LEFT pedicle was performed. The ablation was performed for approximately 25-30 seconds. Attention was now paid towards the kyphoplasty portion of the procedure. A Kyphon inflatable bone tamp was advanced through both working cannulas and positioned with the distal marker approximately 5 mm from the anterior aspect of the cortex. Appropriate positioning was confirmed on the AP projection. At this time, the balloon was expanded using contrast via a Kyphon inflation syringe device via micro tubing. Inflations were continued under direct fluoroscopic guidance. At this time, methylmethacrylate mixture was reconstituted  in the Kyphon bone mixing device system. This was then loaded into the delivery mechanism, attached to Kyphon bone fillers. The balloons were deflated and removed  followed by the instillation of methylmethacrylate mixture with excellent filling in the AP and lateral projections. The working cannulae and the bone filler were then retrieved and removed. Multiple spot radiographic images were obtained in various obliquities. Hemostasis was achieved with manual compression. The patient tolerated the procedure well without immediate postprocedural complication. FINDINGS: *Malignant demineralization/erosion with replacement of tumor at the LEFT pedicle. *Successful percutaneous radiofrequency "OsteoCool" ablation of the vertebral body and LEFT pedicular tract ablation. *Adequate cement filling of the L2 vertebral body on both the AP and lateral projections. *No extravasation was noted in the disk spaces or posteriorly into the spinal canal or along the pedicular access. *No epidural venous contamination was seen. IMPRESSION: Successful L2 vertebral radiofrequency "OsteoCool" vertebral body and LEFT pedicular tract ablation, and bi-pedicular cement augmentation with balloon kyphoplasty, as above. PLAN: The patient will follow-up with me at the Vascular Interventional Radiology clinic in 4 weeks. Select T8 Michaelle Birks, MD [facility] Radiology Electronically Signed   By: Michaelle Birks M.D.   On: 10/11/2021 18:38   IR IMAGING GUIDED PORT INSERTION  Result Date: 10/09/2021 INDICATION: 76 year old female with history of metastatic renal cell carcinoma requiring central venous access for chemotherapy administration. EXAM: IMPLANTED PORT A CATH PLACEMENT WITH ULTRASOUND AND FLUOROSCOPIC GUIDANCE COMPARISON:  None Available. MEDICATIONS: None ANESTHESIA/SEDATION: Moderate (conscious) sedation was employed during this procedure. A total of Versed 1 mg and Fentanyl 0 mcg was administered intravenously. Moderate Sedation Time: 15 minutes. The patient's level of consciousness and vital signs were monitored continuously by radiology nursing  throughout the procedure under my direct supervision. CONTRAST:  None FLUOROSCOPY TIME:  0 minutes,  ( mGy) COMPLICATIONS: None immediate. PROCEDURE: The procedure, risks, benefits, and alternatives were explained to the patient. Questions regarding the procedure were encouraged and answered. The patient understands and consents to the procedure. The right neck and chest were prepped with chlorhexidine in a sterile fashion, and a sterile drape was applied covering the operative field. Maximum barrier sterile technique with sterile gowns and gloves were used for the procedure. A timeout was performed prior to the initiation of the procedure. Ultrasound was used to examine the jugular vein which was compressible and free of internal echoes. A skin marker was used to demarcate the planned venotomy and port pocket incision sites. Local anesthesia was provided to these sites and the subcutaneous tunnel track with 1% lidocaine with 1:100,000 epinephrine. A small incision was created at the jugular access site and blunt dissection was performed of the subcutaneous tissues. Under ultrasound guidance, the jugular vein was accessed with a 21 ga micropuncture needle and an 0.018" wire was inserted to the superior vena cava. Real-time ultrasound guidance was utilized for vascular access including the acquisition of a permanent ultrasound image documenting patency of the accessed vessel. A 5 Fr micopuncture set was then used, through which a 0.035" Rosen wire was passed under fluoroscopic guidance into the inferior vena cava. An 8 Fr dilator was then placed over the wire. A subcutaneous port pocket was then created along the upper chest wall utilizing a combination of sharp and blunt dissection. The pocket was irrigated with sterile saline, packed with gauze, and observed for hemorrhage. A single lumen "ISP" sized power injectable port was chosen for placement. The 8 Fr catheter was tunneled from the port pocket site to  the  venotomy incision. The port was placed in the pocket. The external catheter was trimmed to appropriate length. The dilator was exchanged for an 8 Fr peel-away sheath under fluoroscopic guidance. The catheter was then placed through the sheath and the sheath was removed. Final catheter positioning was confirmed and documented with a fluoroscopic spot radiograph. The port was accessed with a Huber needle, aspirated, and flushed with heparinized saline. The deep dermal layer of the port pocket incision was closed with interrupted 3-0 Vicryl suture. Dermabond was then placed over the port pocket and neck incisions. The patient tolerated the procedure well without immediate post procedural complication. FINDINGS: After catheter placement, the tip lies within the superior cavoatrial junction. The catheter aspirates and flushes normally and is ready for immediate use. IMPRESSION: Successful placement of a power injectable Port-A-Cath via the right internal jugular vein. The catheter is ready for immediate use. Ruthann Cancer, MD [facility] Radiology Electronically Signed   By: Ruthann Cancer M.D.   On: 10/09/2021 16:54    Labs:  CBC: Recent Labs    10/08/21 0456 10/09/21 0601 10/10/21 0547 10/11/21 0521  WBC 9.6 8.7 9.4 9.3  HGB 10.8* 10.7* 11.2* 10.9*  HCT 34.8* 35.0* 37.0 35.3*  PLT 227 237 261 247    COAGS: Recent Labs    10/01/21 1058  INR 1.0    BMP: Recent Labs    10/09/21 0601 10/10/21 0547 10/11/21 0521 10/12/21 0559  NA 139 139 140 141  K 4.0 3.3* 4.5 4.4  CL 108 103 109 107  CO2 '26 28 26 24  '$ GLUCOSE 123* 110* 123* 100*  BUN 6* 6* 6* 7*  CALCIUM 9.1 9.1 9.8 10.0  CREATININE 0.60 0.59 0.58 0.62  GFRNONAA >60 >60 >60 >60    LIVER FUNCTION TESTS: Recent Labs    10/09/21 0601 10/10/21 0547 10/11/21 0521 10/12/21 0559  BILITOT 0.4 0.7 0.8 0.7  AST 34 40 36 50*  ALT '18 19 17 15  '$ ALKPHOS 237* 259* 242* 267*  PROT 5.6* 6.4*  5.9* 6.0*  ALBUMIN 2.2* 2.4* 2.3* 2.5*    Assessment and Plan:  Metastatic renal cancer With L2 fracture --1 day s/p OC and KP --pain improved, ambulation subjectively slightly improved --vertigo limiting advancement of clinical status this afternoon --Access sites are unremarkable.  IR to sign off, but remains available as needed.  Electronically Signed: Pasty Spillers, PA 10/12/2021, 2:02 PM   I spent a total of 15 Minutes at the the patient's bedside AND on the patient's hospital floor or unit, greater than 50% of which was counseling/coordinating care for L2 fracture

## 2021-10-12 NOTE — Progress Notes (Signed)
Ms. Onofre had the kyphoplasty yesterday.  As always, Interventional Radiology did a fantastic job.  But she feels okay.  We really need to start getting her up and about now.  She really needs to start physical therapy.  We need to get her sit in the chair.  We also need to get her Port-A-Cath accessed.  We need to discontinue the IVs and her arms.  Her CMET is not back yet.  We will going to have to watch her calcium level.  We also will hopefully get her to eat better.  Now that all the procedures are done, she should be able to eat whenever she would like.  She still has some indigestion.  There has been no fever.  She has had no bleeding.  There is been no diarrhea.  Her overall mental status seems to be pretty much close to baseline right now.  She does have problems with narcotics with respect to change in mental status.  Her vital signs are temperature of 99.1.  Pulse 103.  Blood pressure 127/64.  Her lungs are clear.  Cardiac exam regular rate and rhythm.  Abdomen is soft.  Bowel sounds are present.  There is no fluid wave.  Extremity shows no clubbing, cyanosis or edema.  Neurological exam is nonfocal.  Ms. Lindquist has metastatic kidney cancer.  This is a high-grade cancer.  She came in with severe back pain because of fracture at L2.  She has had the kyphoplasty now.  We have to watch her calcium.  When she came in her calcium was quite high.  Again we need to start getting her more active now.  Nutrition is going be critical.  Hopefully, she will be able to eat what she would like now.  I am sure her family will bring stuff in for her.  Would be nice to try to get her home in the next few days.  Again we really need physical therapy to help to see what kind of equipment she will need and what type of home physical therapy can be done.  She will also need outpatient radiation therapy.  We would like to get started with immunotherapy on her in a week or so.  I do appreciate the  incredible care she is gone from all the staff on 5 E.   Lattie Haw, MD  [place] 14:14

## 2021-10-12 NOTE — Progress Notes (Signed)
PROCEDURE SUMMARY:  Successful US guided left thoracentesis. Yielded 1397m of sanguinous fluid. Pt tolerated procedure well. No immediate complications.  Specimen was sent for labs. CXR ordered.  EBL < 5 mL  Gissella Niblack PA-C 10/12/2021 2:55 PM

## 2021-10-13 DIAGNOSIS — K219 Gastro-esophageal reflux disease without esophagitis: Secondary | ICD-10-CM | POA: Diagnosis not present

## 2021-10-13 DIAGNOSIS — G9341 Metabolic encephalopathy: Secondary | ICD-10-CM | POA: Diagnosis not present

## 2021-10-13 LAB — COMPREHENSIVE METABOLIC PANEL
ALT: 17 U/L (ref 0–44)
AST: 42 U/L — ABNORMAL HIGH (ref 15–41)
Albumin: 2.2 g/dL — ABNORMAL LOW (ref 3.5–5.0)
Alkaline Phosphatase: 311 U/L — ABNORMAL HIGH (ref 38–126)
Anion gap: 5 (ref 5–15)
BUN: 7 mg/dL — ABNORMAL LOW (ref 8–23)
CO2: 27 mmol/L (ref 22–32)
Calcium: 10.2 mg/dL (ref 8.9–10.3)
Chloride: 103 mmol/L (ref 98–111)
Creatinine, Ser: 0.6 mg/dL (ref 0.44–1.00)
GFR, Estimated: >60 mL/min (ref >60)
Glucose, Bld: 116 mg/dL — ABNORMAL HIGH (ref 70–99)
Potassium: 4.2 mmol/L (ref 3.5–5.1)
Sodium: 135 mmol/L (ref 135–145)
Total Bilirubin: 0.7 mg/dL (ref 0.3–1.2)
Total Protein: 5.7 g/dL — ABNORMAL LOW (ref 6.5–8.1)

## 2021-10-13 LAB — CBC WITH DIFFERENTIAL/PLATELET
Abs Immature Granulocytes: 0.06 10*3/uL (ref 0.00–0.07)
Basophils Absolute: 0 10*3/uL (ref 0.0–0.1)
Basophils Relative: 0 %
Eosinophils Absolute: 0.1 10*3/uL (ref 0.0–0.5)
Eosinophils Relative: 1 %
HCT: 32.3 % — ABNORMAL LOW (ref 36.0–46.0)
Hemoglobin: 9.9 g/dL — ABNORMAL LOW (ref 12.0–15.0)
Immature Granulocytes: 1 %
Lymphocytes Relative: 8 %
Lymphs Abs: 0.9 10*3/uL (ref 0.7–4.0)
MCH: 25.4 pg — ABNORMAL LOW (ref 26.0–34.0)
MCHC: 30.7 g/dL (ref 30.0–36.0)
MCV: 83 fL (ref 80.0–100.0)
Monocytes Absolute: 1 10*3/uL (ref 0.1–1.0)
Monocytes Relative: 9 %
Neutro Abs: 9.1 10*3/uL — ABNORMAL HIGH (ref 1.7–7.7)
Neutrophils Relative %: 81 %
Platelets: 246 10*3/uL (ref 150–400)
RBC: 3.89 MIL/uL (ref 3.87–5.11)
RDW: 18.1 % — ABNORMAL HIGH (ref 11.5–15.5)
WBC: 11.2 10*3/uL — ABNORMAL HIGH (ref 4.0–10.5)
nRBC: 0 % (ref 0.0–0.2)

## 2021-10-13 MED ORDER — ZOLEDRONIC ACID 4 MG/5ML IV CONC
4.0000 mg | Freq: Once | INTRAVENOUS | Status: AC
  Administered 2021-10-13: 4 mg via INTRAVENOUS
  Filled 2021-10-13: qty 5

## 2021-10-13 MED ORDER — MECLIZINE HCL 25 MG PO TABS
25.0000 mg | ORAL_TABLET | Freq: Three times a day (TID) | ORAL | Status: DC | PRN
  Administered 2021-10-13 – 2021-10-18 (×6): 25 mg via ORAL
  Filled 2021-10-13 (×6): qty 1

## 2021-10-13 NOTE — Progress Notes (Signed)
Progress Note   Patient: Jane Kennedy [id number]:[id number] DOB: [birth year] DOA: 09/30/2021     12 DOS: the patient was seen and examined on 10/13/2021   Brief hospital course: 76 year old  f w/ actinic keratosis, benign neoplasm of large intestine, carpal tunnel syndrome, DDD DDD lumbar sacral, diverticulosis, GERD, hyperlipidemia, IBS, osteopenia, vertigo, vitamin D deficiency presented with nausea vomiting, abdominal pain and elevated calcium level.  Evaluated outside facility was diagnosed with UTI, antibiotics prescribed.  She was not able to fill out prescription.She was found to have potassium of 2.3, magnesium 1.7, phosphorus 2.6, calcium 12.1.  Patient admitted for further treatment of hypercalcemia, hypokalemia in the setting of malignancy. She has had worsening of her confusion and delirium agitated at times with tremors, was lethargic 6/6-CT head, ABG unrevealing. Suspect delirium multifactorial  related to acute illness, initial hypercalcemia, medications (receiving opioids, ativan), increase ammonia  level, started on lactulose enema.she underwent liver biopsy 6/07.  Followed by Dr. Marin Olp  Assessment and Plan: No notes have been filed under this hospital service. Service: Hospitalist Hypercalcemia:  Likely related to her malignancy in the setting of renal mass/lytic lesion on the bone.  Received Zometa and calcitonin, calcium this AM up to 10.0, corrected to 11.6 given low albumin -continued on LR at 125cc/hr -With rising Ca, have ordered repeat dose of zometa.  -repeat Ca in AM. If still no improvement, then consider repeat calcitonin   Metastatic renal cell carcinoma She underwent liver biopsy on 10/02/2021 with findings consistent with metastatic renal cell carcinoma. Followed by Dr. Marin Olp Now s/p port Placement for chemotherapy on 6/14   Pathologic fracture L2 vertebrae Pt now s/p kyphoplasty on 10/11/2021 by IR Pain management and bowel regimen in place. -HHPT recommended by PT.  Family interested in [place] services as well, including aide   Resolved hyperammonemia Ammonia level improved with lactulose Goal 2-3 stools per day   Hypokalemia:  Replaced Cont to follow lytes   Elevated liver chemistries Alkaline phosphatase, AST ALT uptrending. Cont to follow LFT trends   Resolved acute metabolic encephalopathy suspect secondary to hyper ammonemia:  Continue delirium and fall precautions Treat underlying conditions. Conversing appropriately   GERD/indigestion:  Continue p.o. PPI twice daily. Continue supportive care as needed.   Hyperlipidemia: Home statin on hold due to elevated liver chemistries   Left kidney mass Diffuse liver mets, lytic bone metastasis involving L2 vertebra: Underwent liver biopsy 6/7 with findings as stated above,  IR kyphoplasty done 10/11/2021   Moderate protein malnutrition:  Liberalize diet Dietitian consulted   Anemia iron deficiency  IV iron, S/P 2 unit PRBC 6/7 monitor Hemoglobin 10.9 on most recent check No overt bleeding reported      Subjective: Complains of mild vertigo this AM, requesting home meclizine  Physical Exam: Vitals:   10/12/21 1718 10/12/21 1959 10/13/21 0553 10/13/21 1347  BP: (!) 124/57 (!) 126/46 133/62 124/72  Pulse: 100 (!) 108 (!) 110 (!) 110  Resp: '16 17 17 20  '$ Temp: 98.4 F (36.9 C) 98.7 F (37.1 C) 98 F (36.7 C) 97.8 F (36.6 C)  TempSrc:  Oral  Oral  SpO2: 97% 97% 94% 97%  Weight:      Height:       General exam: Awake, laying in bed, in nad Respiratory system: Normal respiratory effort, no wheezing Cardiovascular system: regular rate, s1, s2 Gastrointestinal system: Soft, nondistended, positive BS Central nervous system: CN2-12 grossly intact, strength intact Extremities: Perfused, no clubbing Skin: Normal skin turgor, no notable skin lesions  seen Psychiatry: Mood normal // no visual hallucinations   Data Reviewed:  Labs reviewed: Na 135, K 4.2, Corrected Ca  11.6  Family Communication: Pt in room, family at bedside  Disposition: Status is: Inpatient Remains inpatient appropriate because: Severity of illness  Planned Discharge Destination: Home    Author: Marylu Lund, MD 10/13/2021 5:19 PM  For on call review [email or web address].

## 2021-10-14 DIAGNOSIS — G9341 Metabolic encephalopathy: Secondary | ICD-10-CM | POA: Diagnosis not present

## 2021-10-14 DIAGNOSIS — K219 Gastro-esophageal reflux disease without esophagitis: Secondary | ICD-10-CM | POA: Diagnosis not present

## 2021-10-14 LAB — COMPREHENSIVE METABOLIC PANEL
ALT: 19 U/L (ref 0–44)
AST: 41 U/L (ref 15–41)
Albumin: 2.1 g/dL — ABNORMAL LOW (ref 3.5–5.0)
Alkaline Phosphatase: 316 U/L — ABNORMAL HIGH (ref 38–126)
Anion gap: 5 (ref 5–15)
BUN: 8 mg/dL (ref 8–23)
CO2: 28 mmol/L (ref 22–32)
Calcium: 10.3 mg/dL (ref 8.9–10.3)
Chloride: 104 mmol/L (ref 98–111)
Creatinine, Ser: 0.49 mg/dL (ref 0.44–1.00)
GFR, Estimated: >60 mL/min (ref >60)
Glucose, Bld: 111 mg/dL — ABNORMAL HIGH (ref 70–99)
Potassium: 3.8 mmol/L (ref 3.5–5.1)
Sodium: 137 mmol/L (ref 135–145)
Total Bilirubin: 0.7 mg/dL (ref 0.3–1.2)
Total Protein: 5.7 g/dL — ABNORMAL LOW (ref 6.5–8.1)

## 2021-10-14 LAB — CBC WITH DIFFERENTIAL/PLATELET
Abs Immature Granulocytes: 0.04 10*3/uL (ref 0.00–0.07)
Basophils Absolute: 0 10*3/uL (ref 0.0–0.1)
Basophils Relative: 0 %
Eosinophils Absolute: 0.1 10*3/uL (ref 0.0–0.5)
Eosinophils Relative: 1 %
HCT: 31.4 % — ABNORMAL LOW (ref 36.0–46.0)
Hemoglobin: 9.4 g/dL — ABNORMAL LOW (ref 12.0–15.0)
Immature Granulocytes: 0 %
Lymphocytes Relative: 11 %
Lymphs Abs: 1.1 10*3/uL (ref 0.7–4.0)
MCH: 24.9 pg — ABNORMAL LOW (ref 26.0–34.0)
MCHC: 29.9 g/dL — ABNORMAL LOW (ref 30.0–36.0)
MCV: 83.3 fL (ref 80.0–100.0)
Monocytes Absolute: 0.9 10*3/uL (ref 0.1–1.0)
Monocytes Relative: 9 %
Neutro Abs: 8.2 10*3/uL — ABNORMAL HIGH (ref 1.7–7.7)
Neutrophils Relative %: 79 %
Platelets: 231 10*3/uL (ref 150–400)
RBC: 3.77 MIL/uL — ABNORMAL LOW (ref 3.87–5.11)
RDW: 18.1 % — ABNORMAL HIGH (ref 11.5–15.5)
WBC: 10.4 10*3/uL (ref 4.0–10.5)
nRBC: 0 % (ref 0.0–0.2)

## 2021-10-14 MED ORDER — SODIUM CHLORIDE 0.9 % IV SOLN: INTRAVENOUS | Status: DC

## 2021-10-14 NOTE — Progress Notes (Signed)
Progress Note   Patient: Jane Kennedy [id number] DOB: 04/19/1946 DOA: 09/30/2021     13 DOS: the patient was seen and examined on 10/14/2021   Brief hospital course: 76 year old  f w/ actinic keratosis, benign neoplasm of large intestine, carpal tunnel syndrome, DDD DDD lumbar sacral, diverticulosis, GERD, hyperlipidemia, IBS, osteopenia, vertigo, vitamin D deficiency presented with nausea vomiting, abdominal pain and elevated calcium level.  Evaluated outside facility was diagnosed with UTI, antibiotics prescribed.  She was not able to fill out prescription.She was found to have potassium of 2.3, magnesium 1.7, phosphorus 2.6, calcium 12.1.  Patient admitted for further treatment of hypercalcemia, hypokalemia in the setting of malignancy. She has had worsening of her confusion and delirium agitated at times with tremors, was lethargic 6/6-CT head, ABG unrevealing. Suspect delirium multifactorial  related to acute illness, initial hypercalcemia, medications (receiving opioids, ativan), increase ammonia  level, started on lactulose enema.she underwent liver biopsy 6/07.  Followed by Dr. Marin Olp  Assessment and Plan: No notes have been filed under this hospital service. Service: Hospitalist Hypercalcemia:  Likely related to her malignancy in the setting of renal mass/lytic lesion on the bone.  Earlier received Zometa and calcitonin -With rising Ca, pt was given repeat dose of zometa on 6/18 -Ca cont to trend up to 11.8 corrected. Initially considered repeat calcitonin -Discussed with pharmacy who recommended changing IVF to NS instead and if Ca cont to increase in AM, then can give then -Recheck bmet in AM   Metastatic renal cell carcinoma She underwent liver biopsy on 10/02/2021 with findings consistent with metastatic renal cell carcinoma. Followed by Dr. Marin Olp Now s/p port Placement for chemotherapy on 6/14   Pathologic fracture L2 vertebrae Pt now s/p kyphoplasty on 10/11/2021 by  IR Pain management and bowel regimen in place. -HHPT recommended by PT. Family interested in [place] services as well, including aide   Resolved hyperammonemia Ammonia level improved with lactulose Goal 2-3 stools per day   Hypokalemia:  Replaced Cont to follow lytes   Elevated liver chemistries Alkaline phosphatase, AST ALT uptrending. Cont to follow LFT trends   Resolved acute metabolic encephalopathy suspect secondary to hyper ammonemia:  Continue delirium and fall precautions Treat underlying conditions. Conversing appropriately, although family reports pt does seem more confused   GERD/indigestion:  Continue p.o. PPI twice daily. Continue supportive care as needed.   Hyperlipidemia: Home statin on hold due to elevated liver chemistries   Left kidney mass Diffuse liver mets, lytic bone metastasis involving L2 vertebra: Underwent liver biopsy 6/7 with findings as stated above,  IR kyphoplasty done 10/11/2021   Moderate protein malnutrition:  Liberalize diet Dietitian consulted   Anemia iron deficiency  IV iron, S/P 2 unit PRBC 6/7 monitor No overt bleeding reported      Subjective: Pleasant, without complaints. Family in room reports increased confusion  Physical Exam: Vitals:   10/13/21 0553 10/13/21 1347 10/13/21 1958 10/14/21 0506  BP: 133/62 124/72 (!) 123/58 (!) 114/54  Pulse: (!) 110 (!) 110 (!) 101 99  Resp: '17 20 18 16  '$ Temp: 98 F (36.7 C) 97.8 F (36.6 C) 98.3 F (36.8 C) 98.3 F (36.8 C)  TempSrc:  Oral    SpO2: 94% 97% 94% 92%  Weight:      Height:       General exam: Awake, laying in bed, in nad Respiratory system: Normal respiratory effort, no wheezing Cardiovascular system: regular rate, s1, s2 Gastrointestinal system: Soft, nondistended, positive BS Central nervous system: CN2-12 grossly intact,  strength intact Extremities: Perfused, no clubbing Skin: Normal skin turgor, no notable skin lesions seen Psychiatry: Mood normal //  no visual hallucinations   Data Reviewed:  Labs reviewed: Na 137, K 3.8, Corrected Ca 11.8  Family Communication: Pt in room, family at bedside  Disposition: Status is: Inpatient Remains inpatient appropriate because: Severity of illness  Planned Discharge Destination: Home    Author: Marylu Lund, MD 10/14/2021 3:07 PM  For on call review [email or web address].

## 2021-10-14 NOTE — Progress Notes (Signed)
Physical Therapy Treatment Patient Details Name: Jane Kennedy MRN: [id number] DOB: 1945/08/29 Today's Date: 10/14/2021   History of Present Illness 76 year old  f w/ actinic keratosis, benign neoplasm of large intestine, carpal tunnel syndrome, DDD lumbar sacral, diverticulosis, GERD, hyperlipidemia, IBS, osteopenia, vertigo, vitamin D deficiency presented with nausea, vomiting, abdominal pain and elevated calcium level.   Evaluated outside facility was diagnosed with UTI, antibiotics prescribed.  She was not able to fill out prescription.  She was found to have potassium of 2.3, magnesium 1.7, phosphorus 2.6, calcium 12.1.  Patient admitted for further treatment of hypercalcemia, hypokalemia in the setting of malignancy.  She has had worsening of confusion and delirium, agitated at times with tremors, was lethargic 6/6-CT head, ABG unrevealing. Suspect delirium multifactorial  related to acute illness, initial hypercalcemia, medications (receiving opioids, ativan), increase ammonia  level, started on lactulose.  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  Followed by Dr. Marin Olp.  Pt with Pathologic fracture L2 vertebrae now s/p kyphoplasty on 10/11/2021 by IR    PT Comments    Pt requiring at least mod assist today due to "vertigo."  Pt unable to keep her eyes open during standing and pivoting and felt unable to tolerate ambulation.  RN brought Meclizine end of session.  Due to pt's slow progress and requiring assist for mobility, updated d/c recommendations to SNF.     Recommendations for follow up therapy are one component of a multi-disciplinary discharge planning process, led by the attending physician.  Recommendations may be updated based on patient status, additional functional criteria and insurance authorization.  Follow Up Recommendations  Skilled nursing-short term rehab (<3 hours/day)     Assistance Recommended at Discharge Frequent or  constant Supervision/Assistance  Patient can return home with the following A lot of help with walking and/or transfers;A lot of help with bathing/dressing/bathroom;Assistance with cooking/housework;Assistance with feeding;Direct supervision/assist for medications management;Direct supervision/assist for financial management;Assist for transportation;Help with stairs or ramp for entrance   Equipment Recommendations  Rolling walker (2 wheels)    Recommendations for Other Services       Precautions / Restrictions Precautions Precautions: Back;Fall Precaution Comments: hx vertigo, back for comfort s/p KP     Mobility  Bed Mobility Overal bed mobility: Needs Assistance Bed Mobility: Rolling, Sidelying to Sit Rolling: Min guard Sidelying to sit: Mod assist       General bed mobility comments: reminded pt to perform log roll technique since s/p KP to L2 comp fx, assist for trunk upright; pt reporting increased vertigo symptoms today - increased time required    Transfers Overall transfer level: Needs assistance Equipment used: Rolling walker (2 wheels) Transfers: Sit to/from Stand, Bed to chair/wheelchair/BSC Sit to Stand: Mod assist, +2 physical assistance   Step pivot transfers: Mod assist       General transfer comment: verbal cues for hand placement and utilizing LEs to "power up"; pt unable to tolerate ambulating due to vertigo however transferred to recliner for OOB activity, RN brought pt meclizine, performed one more sit to stand from recliner so NT could change depends    Ambulation/Gait                   Stairs             Wheelchair Mobility    Modified Rankin (Stroke Patients Only)       Balance  Cognition Arousal/Alertness: Awake/alert Behavior During Therapy: WFL for tasks assessed/performed Overall Cognitive Status: Within Functional Limits for tasks assessed                                           Exercises      General Comments        Pertinent Vitals/Pain Pain Assessment Pain Assessment: Faces Faces Pain Scale: Hurts little more Pain Location: back Pain Descriptors / Indicators: Aching, Sore Pain Intervention(s): Repositioned, Monitored during session    Home Living                          Prior Function            PT Goals (current goals can now be found in the care plan section) Progress towards PT goals: Progressing toward goals    Frequency    Min 2X/week      PT Plan Discharge plan needs to be updated    Co-evaluation              AM-PAC PT "6 Clicks" Mobility   Outcome Measure  Help needed turning from your back to your side while in a flat bed without using bedrails?: A Little Help needed moving from lying on your back to sitting on the side of a flat bed without using bedrails?: A Lot Help needed moving to and from a bed to a chair (including a wheelchair)?: A Lot Help needed standing up from a chair using your arms (e.g., wheelchair or bedside chair)?: A Lot Help needed to walk in hospital room?: A Lot Help needed climbing 3-5 steps with a railing? : Total 6 Click Score: 12    End of Session Equipment Utilized During Treatment: Gait belt Activity Tolerance: Patient limited by fatigue Patient left: in chair;with chair alarm set;with call bell/phone within reach Nurse Communication: Mobility status PT Visit Diagnosis: Muscle weakness (generalized) (M62.81);Other abnormalities of gait and mobility (R26.89)     Time: 0177-9390 PT Time Calculation (min) (ACUTE ONLY): 19 min  Charges:  $Therapeutic Activity: 8-22 mins                     Jannette Spanner PT, DPT Acute Rehabilitation Services Pager: [phone] Office: [place] 10/14/2021, 4:19 PM

## 2021-10-14 NOTE — Progress Notes (Signed)
Jane Kennedy seems to be doing okay.  I did note that her calcium has been trending upward.  I appreciate Dr. Rhetta Mura help and given her the Zometa yesterday.  Given that her albumin is still low, her corrected calcium is quite high.  She is not having any obvious back pain.  I think she had little physical therapy Saturday.  I still think she did not be able to go home.  I still think she can be able to be independent at home.  I am not sure she qualifies for 24-hour nursing care.  I really think she is going to have to consider some kind of rehab.  I would see if she would be a candidate for [facility] Inpatient Re[facility].  I think this would be reasonable.  We really need to try to get her to eat a little bit better.  Again given her low albumin, this is certainly a factor with how well she is going to do with treatment.  We really need to get treatment started on her.  Unfortunately, because of Medicare rules, we cannot do inpatient immunotherapy.  Her labs today show white count 10.4.  Hemoglobin 9.4.  Platelet count 231,000.  Her calcium is 10.3 with an albumin of 2.1.  Her alkaline phosphatase is 316.  Her creatinine is 0.49.  She we will also need to have Radiation Oncology see her again.  Now that she has had the kyphoplasty, they can start radiation on her back.  I think from what her daughter says, that have an appointment on Wednesday.  I think the real problem now is where she needs to go with discharge.  I just do not think she is able to be by herself.  I know she has her 2 cats as she wants to be with.  Again, I do not think that she is going be independent enough to be home by herself.  I guess Case management can see about 24-hour nursing care.  I am sure this is can be quite expensive.  Her vital signs are temperature 98.3.  Pulse 99.  Blood pressure 114/54.  Her lungs are clear bilaterally.  Cardiac exam regular rate and rhythm.  Abdomen is soft.  Bowel sounds are present.  There is no  fluid wave.  Extremity shows no clubbing, cyanosis or edema.  Neurological exam shows some slight disorientation.  I know this is incredibly complicated.  Jane Kennedy has a high-grade renal cell carcinoma.  She has hypercalcemia.  She has extensive bony lesions and hepatic metastasis.  We really need to get systemic therapy started on her.  Again, I am not sure when we can get this started with her being in the hospital.  I know that she is getting incredible care from all the staff up on [address].  I do appreciate all of their compassion.  Jane Haw, MD  1 Mikeal [place]thorne 2:2

## 2021-10-15 ENCOUNTER — Encounter: Payer: Self-pay | Admitting: *Deleted

## 2021-10-15 DIAGNOSIS — G9341 Metabolic encephalopathy: Secondary | ICD-10-CM | POA: Diagnosis not present

## 2021-10-15 DIAGNOSIS — K219 Gastro-esophageal reflux disease without esophagitis: Secondary | ICD-10-CM | POA: Diagnosis not present

## 2021-10-15 LAB — CBC WITH DIFFERENTIAL/PLATELET
Abs Immature Granulocytes: 0.05 10*3/uL (ref 0.00–0.07)
Basophils Absolute: 0 10*3/uL (ref 0.0–0.1)
Basophils Relative: 0 %
Eosinophils Absolute: 0.2 10*3/uL (ref 0.0–0.5)
Eosinophils Relative: 2 %
HCT: 29.3 % — ABNORMAL LOW (ref 36.0–46.0)
Hemoglobin: 8.8 g/dL — ABNORMAL LOW (ref 12.0–15.0)
Immature Granulocytes: 1 %
Lymphocytes Relative: 14 %
Lymphs Abs: 1.2 10*3/uL (ref 0.7–4.0)
MCH: 25.1 pg — ABNORMAL LOW (ref 26.0–34.0)
MCHC: 30 g/dL (ref 30.0–36.0)
MCV: 83.7 fL (ref 80.0–100.0)
Monocytes Absolute: 0.6 10*3/uL (ref 0.1–1.0)
Monocytes Relative: 7 %
Neutro Abs: 6.3 10*3/uL (ref 1.7–7.7)
Neutrophils Relative %: 76 %
Platelets: 217 10*3/uL (ref 150–400)
RBC: 3.5 MIL/uL — ABNORMAL LOW (ref 3.87–5.11)
RDW: 18.3 % — ABNORMAL HIGH (ref 11.5–15.5)
WBC: 8.3 10*3/uL (ref 4.0–10.5)
nRBC: 0 % (ref 0.0–0.2)

## 2021-10-15 LAB — IRON AND TIBC
Iron: 13 ug/dL — ABNORMAL LOW (ref 28–170)
Saturation Ratios: 8 % — ABNORMAL LOW (ref 10.4–31.8)
TIBC: 161 ug/dL — ABNORMAL LOW (ref 250–450)
UIBC: 148 ug/dL

## 2021-10-15 LAB — COMPREHENSIVE METABOLIC PANEL
ALT: 18 U/L (ref 0–44)
AST: 40 U/L (ref 15–41)
Albumin: 2 g/dL — ABNORMAL LOW (ref 3.5–5.0)
Alkaline Phosphatase: 329 U/L — ABNORMAL HIGH (ref 38–126)
Anion gap: 4 — ABNORMAL LOW (ref 5–15)
BUN: 8 mg/dL (ref 8–23)
CO2: 27 mmol/L (ref 22–32)
Calcium: 9.9 mg/dL (ref 8.9–10.3)
Chloride: 107 mmol/L (ref 98–111)
Creatinine, Ser: 0.5 mg/dL (ref 0.44–1.00)
GFR, Estimated: >60 mL/min (ref >60)
Glucose, Bld: 108 mg/dL — ABNORMAL HIGH (ref 70–99)
Potassium: 3.7 mmol/L (ref 3.5–5.1)
Sodium: 138 mmol/L (ref 135–145)
Total Bilirubin: 0.5 mg/dL (ref 0.3–1.2)
Total Protein: 5.3 g/dL — ABNORMAL LOW (ref 6.5–8.1)

## 2021-10-15 MED ORDER — DARBEPOETIN ALFA 300 MCG/0.6ML IJ SOSY
300.0000 ug | PREFILLED_SYRINGE | Freq: Once | INTRAMUSCULAR | Status: AC
  Administered 2021-10-15: 300 ug via SUBCUTANEOUS
  Filled 2021-10-15: qty 0.6

## 2021-10-15 MED ORDER — CALCITONIN (SALMON) 200 UNIT/ML IJ SOLN
4.0000 [IU]/kg | Freq: Two times a day (BID) | INTRAMUSCULAR | Status: AC
  Administered 2021-10-15 (×2): 240 [IU] via SUBCUTANEOUS
  Filled 2021-10-15 (×2): qty 1.2

## 2021-10-15 NOTE — Progress Notes (Signed)
Progress Note   Patient: Jane Kennedy FVC:[id number] DOB: 11-May-1945 DOA: 09/30/2021     14 DOS: the patient was seen and examined on 10/15/2021   Brief hospital course: 76 year old  f w/ actinic keratosis, benign neoplasm of large intestine, carpal tunnel syndrome, DDD DDD lumbar sacral, diverticulosis, GERD, hyperlipidemia, IBS, osteopenia, vertigo, vitamin D deficiency presented with nausea vomiting, abdominal pain and elevated calcium level.  Evaluated outside facility was diagnosed with UTI, antibiotics prescribed.  She was not able to fill out prescription.She was found to have potassium of 2.3, magnesium 1.7, phosphorus 2.6, calcium 12.1.  Patient admitted for further treatment of hypercalcemia, hypokalemia in the setting of malignancy. She has had worsening of her confusion and delirium agitated at times with tremors, was lethargic 6/6-CT head, ABG unrevealing. Suspect delirium multifactorial  related to acute illness, initial hypercalcemia, medications (receiving opioids, ativan), increase ammonia  level, started on lactulose enema.she underwent liver biopsy 6/07.  Followed by Dr. Marin Olp  Assessment and Plan: No notes have been filed under this hospital service. Service: Hospitalist Hypercalcemia:  Likely related to her malignancy in the setting of renal mass/lytic lesion on the bone.  Earlier received Zometa and calcitonin -Ca initially normalized, however began to rise again. With rising Ca, pt was given repeat dose of zometa on 6/18 -Corrected Ca still 11.5. Calcitonin ordered by Oncology -Continue IVF hydration as tolerated -Recheck bmet in AM   Metastatic renal cell carcinoma She underwent liver biopsy on 10/02/2021 with findings consistent with metastatic renal cell carcinoma. Followed by Dr. Marin Olp Now s/p port Placement for chemotherapy on 6/14   Pathologic fracture L2 vertebrae Pt now s/p kyphoplasty on 10/11/2021 by IR Pain management and bowel regimen in place. -HHPT  recommended by PT. Family interested in [place] services as well, including aide   Resolved hyperammonemia Ammonia level improved with lactulose Goal 2-3 stools per day   Hypokalemia:  K 3.7 today, will give supplement Cont to follow lytes   Elevated liver chemistries Alkaline phosphatase, trending up slightly AST/ALT normalized Cont to follow LFT trends   Resolved acute metabolic encephalopathy suspect secondary to hyper ammonemia:  Continue delirium and fall precautions Treat underlying conditions. Conversing appropriately, although family reports pt does seem more confused   GERD/indigestion:  Continue p.o. PPI twice daily. Continue supportive care as needed.   Hyperlipidemia: Statin had been on hold due to elevated liver chemistries initially   Left kidney mass Diffuse liver mets, lytic bone metastasis involving L2 vertebra: Underwent liver biopsy 6/7 with findings as stated above,  IR kyphoplasty done 10/11/2021   Moderate protein malnutrition:  Liberalize diet Dietitian consulted   Anemia iron deficiency  IV iron, S/P 2 unit PRBC 6/7 monitor No overt bleeding reported Hgb stable Repeat cbc in AM      Subjective: More oriented with increased appetite today  Physical Exam: Vitals:   10/14/21 1554 10/14/21 1922 10/15/21 0308 10/15/21 1422  BP: (!) 117/47 (!) 125/55 (!) 120/54 (!) 137/55  Pulse: 100 (!) 107 (!) 105 (!) 109  Resp: '17 16 16 16  '$ Temp: 98 F (36.7 C) 99.4 F (37.4 C) 98.8 F (37.1 C) 98.7 F (37.1 C)  TempSrc: Oral Oral Axillary Oral  SpO2: 93% 91% 92% 93%  Weight:      Height:       General exam: Awake, laying in bed, in nad Respiratory system: Normal respiratory effort, no wheezing Cardiovascular system: regular rate, s1, s2 Gastrointestinal system: Soft, nondistended, positive BS Central nervous system: CN2-12 grossly  intact, strength intact Extremities: Perfused, no clubbing Skin: Normal skin turgor, no notable skin lesions  seen Psychiatry: Mood normal // no visual hallucinations   Data Reviewed:  Labs reviewed: Na 138, K 3.7, Corrected Ca 11.5  Family Communication: Pt in room, family at bedside  Disposition: Status is: Inpatient Remains inpatient appropriate because: Severity of illness  Planned Discharge Destination: Home    Author: Marylu Lund, MD 10/15/2021 2:51 PM  For on call review [email or web address].

## 2021-10-15 NOTE — Progress Notes (Signed)
Occupational Therapy Treatment Patient Details Name: Jane Kennedy MRN: [id number] DOB: 12/31/1945 Today's Date: 10/15/2021   History of present illness 76 year old  f w/ actinic keratosis, benign neoplasm of large intestine, carpal tunnel syndrome, DDD lumbar sacral, diverticulosis, GERD, hyperlipidemia, IBS, osteopenia, vertigo, vitamin D deficiency presented with nausea, vomiting, abdominal pain and elevated calcium level.   Evaluated outside facility was diagnosed with UTI, antibiotics prescribed.  She was not able to fill out prescription.  She was found to have potassium of 2.3, magnesium 1.7, phosphorus 2.6, calcium 12.1.  Patient admitted for further treatment of hypercalcemia, hypokalemia in the setting of malignancy.  She has had worsening of confusion and delirium, agitated at times with tremors, was lethargic 6/6-CT head, ABG unrevealing. Suspect delirium multifactorial  related to acute illness, initial hypercalcemia, medications (receiving opioids, ativan), increase ammonia  level, started on lactulose.  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  Followed by Dr. Marin Olp.  Pt with Pathologic fracture L2 vertebrae now s/p kyphoplasty on 10/11/2021 by IR   OT comments  Patient noted to have increased pain with all activity with pain medications on board. Patient was able to engage in dangling on EOB x2 with pain limiting any further movement. Patient will need 24/7 caregiver support in next level of care at current level. Patient would continue to benefit from skilled OT services at this time while admitted and after d/c to address noted deficits in order to improve overall safety and independence in ADLs.     Recommendations for follow up therapy are one component of a multi-disciplinary discharge planning process, led by the attending physician.  Recommendations may be updated based on patient status, additional functional criteria and insurance  authorization.    Follow Up Recommendations  Skilled nursing-short term rehab (<3 hours/day)    Assistance Recommended at Discharge Frequent or constant Supervision/Assistance  Patient can return home with the following  A lot of help with walking and/or transfers;A lot of help with bathing/dressing/bathroom   Equipment Recommendations  None recommended by OT    Recommendations for Other Services      Precautions / Restrictions Precautions Precautions: Back;Fall Precaution Comments: hx vertigo, back for comfort s/p KP Restrictions Weight Bearing Restrictions: No       Mobility Bed Mobility Overal bed mobility: Needs Assistance Bed Mobility: Rolling, Sidelying to Sit   Sidelying to sit: Mod assist       General bed mobility comments: mod A with cues for log rolling technique with patient unable to carryover between bed mobility tasks.    Transfers                         Balance                                           ADL either performed or assessed with clinical judgement   ADL Overall ADL's : Needs assistance/impaired                                       General ADL Comments: patient was mod a for supine to sit on edge of bed x2 with patient unable to carryover education. patient with increased pain sitting EOB. patient's daughter inquired about AIR services. daughter was educated  that patient would not tolerate AIR services at this point. patient was limited by pain and weakness at this time. patient unable to follow cues for proper positioning on edge of bed with granddaughter there to provide encouragement. patient attempted to engage in scooting up bed with patient able to move one tiny scoot then patient put herself back in supine. patient declined to participate further at this time. patient was positioned in bed with all needs within reach at this time, nurse updated on participation in session     Extremity/Trunk Assessment              Vision       Perception     Praxis      Cognition Arousal/Alertness: Awake/alert Behavior During Therapy: [facility] for tasks assessed/performed Overall Cognitive Status: Within Functional Limits for tasks assessed                                 General Comments: daughter and granddaughter in room        Exercises      Shoulder Instructions       General Comments      Pertinent Vitals/ Pain       Pain Assessment Pain Assessment: Faces Faces Pain Scale: Hurts whole lot Pain Location: back Pain Descriptors / Indicators: Aching, Sore Pain Intervention(s): Patient requesting pain meds-RN notified, RN gave pain meds during session, Monitored during session, Repositioned  Home Living                                          Prior Functioning/Environment              Frequency  Min 2X/week        Progress Toward Goals  OT Goals(current goals can now be found in the care plan section)  Progress towards OT goals: Not progressing toward goals - comment (pain limiting progress)     Plan Discharge plan remains appropriate    Co-evaluation                 AM-PAC OT "6 Clicks" Daily Activity     Outcome Measure   Help from another person eating meals?: A Little Help from another person taking care of personal grooming?: A Little Help from another person toileting, which includes using toliet, bedpan, or urinal?: Total Help from another person bathing (including washing, rinsing, drying)?: Total Help from another person to put on and taking off regular upper body clothing?: A Lot Help from another person to put on and taking off regular lower body clothing?: Total 6 Click Score: 11    End of Session    OT Visit Diagnosis: Muscle weakness (generalized) (M62.81)   Activity Tolerance     Patient Left in bed;with call bell/phone within reach;with bed alarm set;with  family/visitor present   Nurse Communication Other (comment) (patients participation in session)        Time: 1308-6578 OT Time Calculation (min): 20 min  Charges: OT General Charges $OT Visit: 1 Visit OT Treatments $Therapeutic Activity: 8-22 mins  Jackelyn Poling OTR/L, MS Acute Rehabilitation Department Office# [phone] Pager# [phone]   Marcellina Millin 10/15/2021, 4:20 PM

## 2021-10-15 NOTE — Progress Notes (Signed)
Overall, Ms. Jane Kennedy seems to be a little bit more disoriented.  There is not a lot of disorientation but she just seems a little bit different than yesterday.  She tried some physical therapy yesterday.  It did not go all that well.  I suspect that she is going to have to go to either rehab or skilled nursing.  I know she wants to go home.  I do still think she is going to be able to be cared for at home.  I know her family is trying really hard but I am not sure that they really have the resources be able to take care of her at home.  I noted that her calcium is still on the high side.   Her calcium was 9.9 today.  When corrected, the calcium is 11.9.  I will have to give her a dose of calcitonin today.  I also know that her hemoglobin is lower.  This is trending down.  Her MCV is a little on the lower side.  I will need to check her iron studies.  I will give her a dose of Aranesp.  Her erythropoietin level is only 40.  As such, I think she would respond to Aranesp.  I want to try to avoid trying to transfuse her.  I am unsure when she will start radiation for her back.  It sounds like it might be this week.  I just wish that her overall performance status was limited better.  I wish that she was a bit more functional and mobile.  I think this would help her.  She has had a decent appetite.  I do not think there is been a problems with nausea or vomiting.  She does not complain of any obvious indigestion or reflux.  Again, we really have to treat her systemically.  I am unsure we can do this until she is an outpatient.  Again, I do not think we can use immunotherapy in an inpatient setting.  I just hate that this is taking a lot out of her.  I know that she has good support from her family.  I know that she is trying her best.  I appreciate the great care that she is getting from the staff up on 5 E.  Lattie Haw, MD  [name] 1:37

## 2021-10-15 NOTE — Progress Notes (Signed)
Nutrition Follow-up  INTERVENTION:   -Boost Breeze po TID, each supplement provides 250 kcal and 9 grams of protein  -Multivitamin with minerals daily  NUTRITION DIAGNOSIS:   Increased nutrient needs related to acute illness as evidenced by estimated needs.  Ongoing.  GOAL:   Patient will meet greater than or equal to 90% of their needs  Progressing.  MONITOR:   PO intake, Supplement acceptance, Labs, Weight trends, I & O's  ASSESSMENT:   76 year old with past medical history significant for actinic keratosis, benign neoplasm of large intestine, carpal tunnel syndrome, DDD DDD lumbar sacral, diverticulosis, GERD, hyperlipidemia, IBS, osteopenia, vertigo, vitamin D deficiency presents with nausea vomiting, abdominal pain and elevated calcium level.  6/7: s/p liver biopsy 6/14: s/p PAC placement 6/16: s/p kyphoplasty  Patient currently consuming 10-50% of meals. Accepting Boost Breeze most days, not much yesterday.   Admission weight: 130 lbs Last weight 6/14: 132 lbs  Medications: Multivitamin with minerals daily, Miralax, Senokot, Carafate  Labs reviewed: Low iron (13)  Diet Order:   Diet Order             Diet regular Room service appropriate? Yes; Fluid consistency: Thin  Diet effective now                   EDUCATION NEEDS:   Not appropriate for education at this time  Skin:  Skin Assessment: Reviewed RN Assessment  Last BM:  6/16 -type 6  Height:   Ht Readings from Last 1 Encounters:  09/30/21 '5\' 3"'$  (1.6 m)    Weight:   Wt Readings from Last 1 Encounters:  10/09/21 60 kg    BMI:  Body mass index is 23.42 kg/m.  Estimated Nutritional Needs:   Kcal:  1500-1700  Protein:  70-85g  Fluid:  1.7L/day  Clayton Bibles, MS, RD, LDN Inpatient Clinical Dietitian Contact information available via Amion

## 2021-10-15 NOTE — Progress Notes (Signed)
Patient continues to be hospitalized. Will continue to follow for post discharge needs and office follow up.   Called and checked in with [facility], her daughter. She is doing ok. Her and brother are sharing responsibility to try and avoid burnout. She knows we will try to coordinate treatment once her mother has been discharged.   Oncology Nurse Navigator Documentation     10/15/2021   10:15 AM  Oncology Nurse Navigator Flowsheets  Navigator Follow Up Date: 10/21/2021  Navigator Follow Up Reason: Appointment Review  Navigator Location [facility]  Navigator Encounter Type Appt/Treatment Plan Review;Telephone  Telephone Outgoing Call  Patient Visit Type MedOnc  Treatment Phase Pre-Tx/Tx Discussion  Barriers/Navigation Needs Coordination of Care;Education;Family Concerns  Interventions Education;Psycho-Social Support  Acuity Level 2-Minimal Needs (1-2 Barriers Identified)  Education Method Verbal  Support Groups/Services Friends and Family  Time Spent with Patient 15

## 2021-10-16 ENCOUNTER — Inpatient Hospital Stay (HOSPITAL_COMMUNITY): Payer: Medicare HMO

## 2021-10-16 ENCOUNTER — Ambulatory Visit
Admit: 2021-10-16 | Discharge: 2021-10-16 | Disposition: A | Discharge status: Home / Self Care | Payer: Medicare HMO | Attending: Radiation Oncology | Admitting: Radiation Oncology

## 2021-10-16 DIAGNOSIS — D500 Iron deficiency anemia secondary to blood loss (chronic): Secondary | ICD-10-CM | POA: Diagnosis not present

## 2021-10-16 DIAGNOSIS — G9341 Metabolic encephalopathy: Secondary | ICD-10-CM | POA: Diagnosis not present

## 2021-10-16 DIAGNOSIS — C7951 Secondary malignant neoplasm of bone: Secondary | ICD-10-CM | POA: Diagnosis not present

## 2021-10-16 LAB — COMPREHENSIVE METABOLIC PANEL
ALT: 19 U/L (ref 0–44)
AST: 43 U/L — ABNORMAL HIGH (ref 15–41)
Albumin: 2.1 g/dL — ABNORMAL LOW (ref 3.5–5.0)
Alkaline Phosphatase: 357 U/L — ABNORMAL HIGH (ref 38–126)
Anion gap: 5 (ref 5–15)
BUN: 7 mg/dL — ABNORMAL LOW (ref 8–23)
CO2: 25 mmol/L (ref 22–32)
Calcium: 9.6 mg/dL (ref 8.9–10.3)
Chloride: 108 mmol/L (ref 98–111)
Creatinine, Ser: 0.56 mg/dL (ref 0.44–1.00)
GFR, Estimated: >60 mL/min (ref >60)
Glucose, Bld: 120 mg/dL — ABNORMAL HIGH (ref 70–99)
Potassium: 3.9 mmol/L (ref 3.5–5.1)
Sodium: 138 mmol/L (ref 135–145)
Total Bilirubin: 0.9 mg/dL (ref 0.3–1.2)
Total Protein: 5.7 g/dL — ABNORMAL LOW (ref 6.5–8.1)

## 2021-10-16 LAB — CBC WITH DIFFERENTIAL/PLATELET
Abs Immature Granulocytes: 0.06 10*3/uL (ref 0.00–0.07)
Basophils Absolute: 0 10*3/uL (ref 0.0–0.1)
Basophils Relative: 0 %
Eosinophils Absolute: 0 10*3/uL (ref 0.0–0.5)
Eosinophils Relative: 1 %
HCT: 31.5 % — ABNORMAL LOW (ref 36.0–46.0)
Hemoglobin: 9.6 g/dL — ABNORMAL LOW (ref 12.0–15.0)
Immature Granulocytes: 1 %
Lymphocytes Relative: 9 %
Lymphs Abs: 0.8 10*3/uL (ref 0.7–4.0)
MCH: 25.5 pg — ABNORMAL LOW (ref 26.0–34.0)
MCHC: 30.5 g/dL (ref 30.0–36.0)
MCV: 83.8 fL (ref 80.0–100.0)
Monocytes Absolute: 0.5 10*3/uL (ref 0.1–1.0)
Monocytes Relative: 6 %
Neutro Abs: 7.4 10*3/uL (ref 1.7–7.7)
Neutrophils Relative %: 83 %
Platelets: 282 10*3/uL (ref 150–400)
RBC: 3.76 MIL/uL — ABNORMAL LOW (ref 3.87–5.11)
RDW: 18.5 % — ABNORMAL HIGH (ref 11.5–15.5)
WBC: 8.8 10*3/uL (ref 4.0–10.5)
nRBC: 0 % (ref 0.0–0.2)

## 2021-10-16 LAB — TYPE AND SCREEN
ABO/RH(D): A POS
Antibody Screen: NEGATIVE

## 2021-10-16 LAB — AMMONIA: Ammonia: 44 umol/L — ABNORMAL HIGH (ref 9–35)

## 2021-10-16 IMAGING — DX DG ABD PORTABLE 2V
1 series · 2 of 2 positions shown · non-contrast
Comparison: None Available.

CLINICAL DATA: Abdominal pain

EXAM:
PORTABLE ABDOMEN - 2 VIEW

[Series 1: abdomen kub · 0.14mm/px · 2 of 2 slices shown]
[im 1/2]
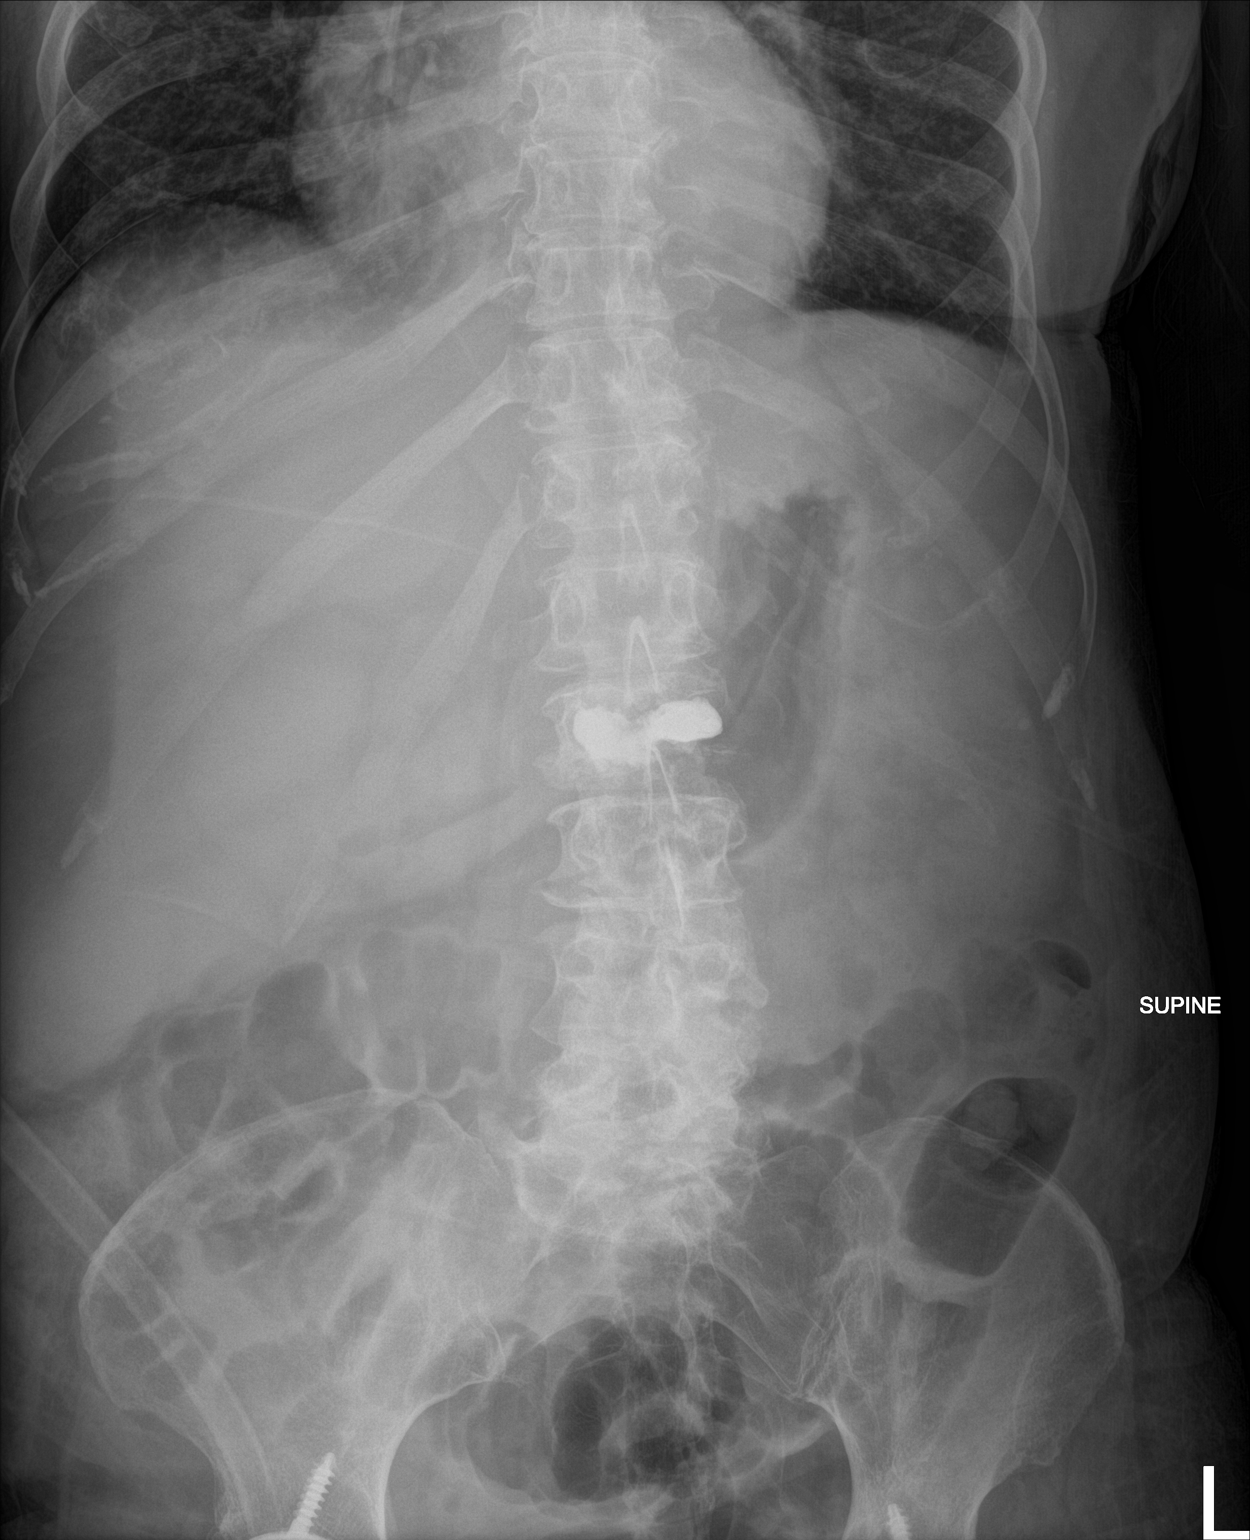
[im 2/2]
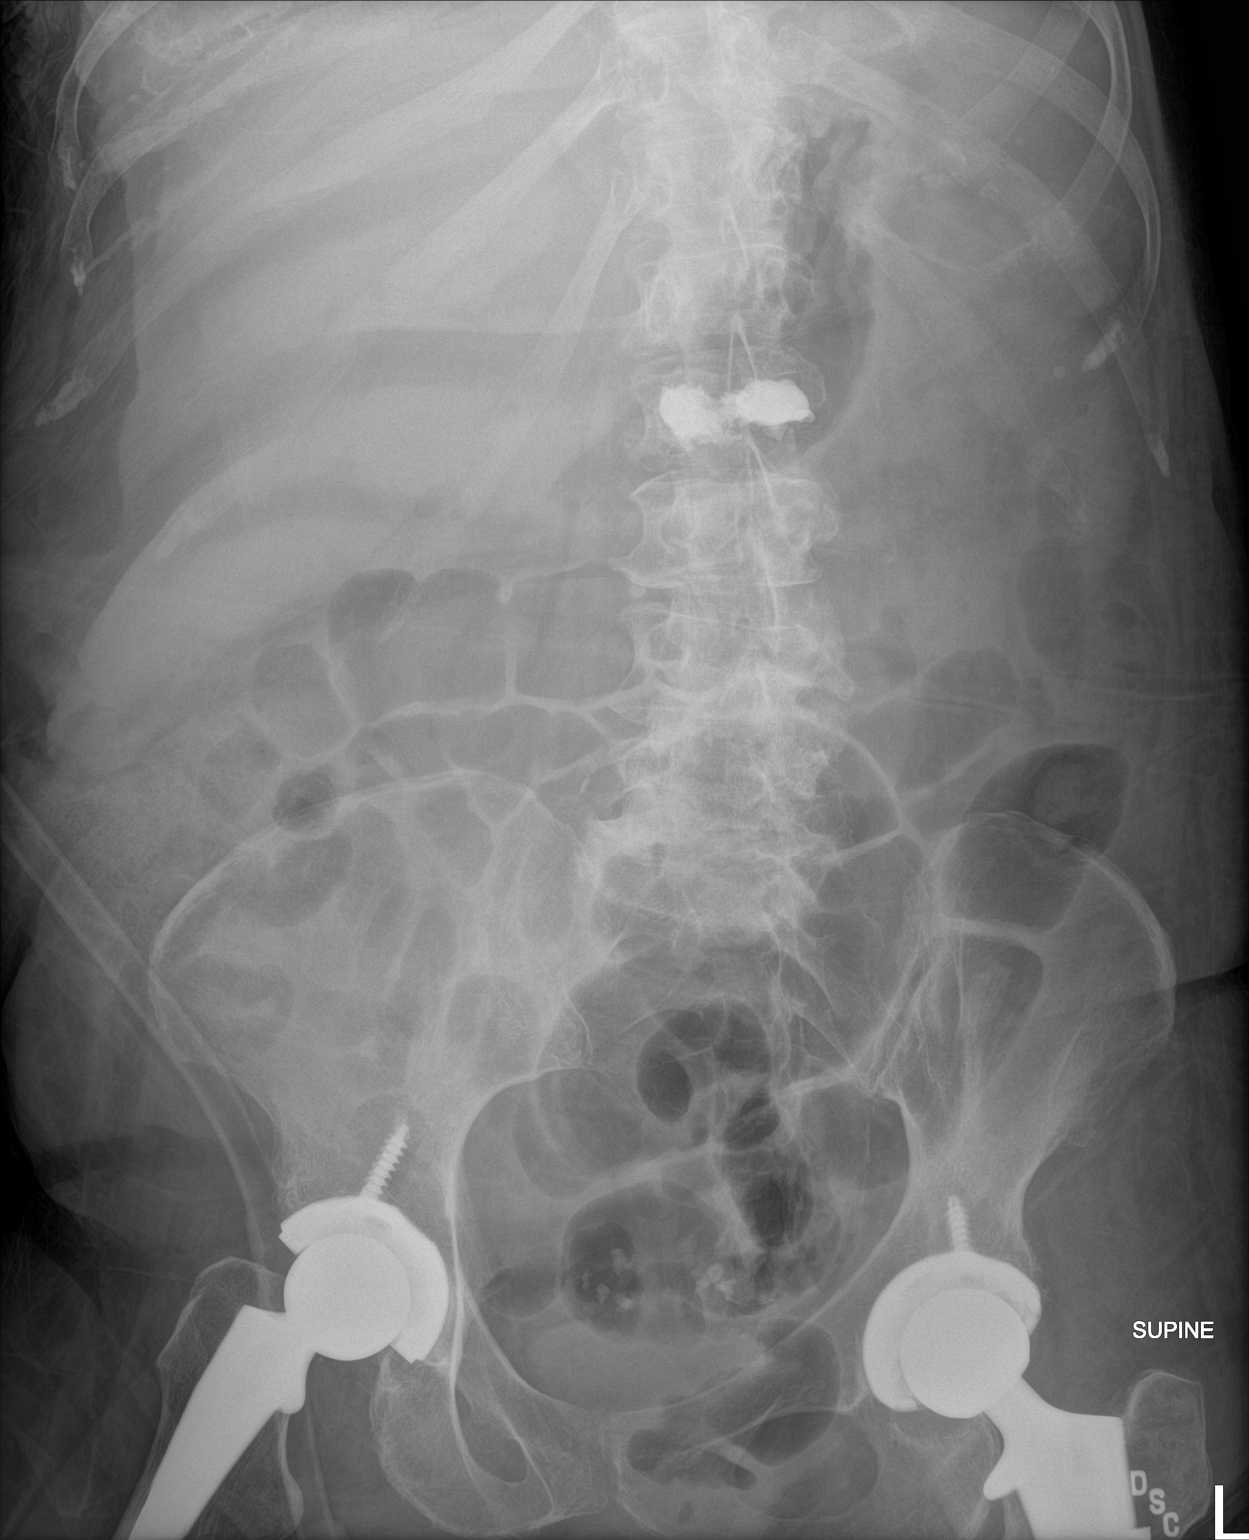

[2 of 2 positions shown; findings below may reference images not displayed]

FINDINGS: Nonobstructive bowel-gas pattern. Visualized lung bases are clear.
Prior bilateral total hip arthroplasties. Moderate to severe
degenerative changes of the lumbar spine with prior L2
vertebroplasty.
IMPRESSION: Nonobstructive bowel gas pattern.

## 2021-10-16 MED ORDER — LACTULOSE 10 GM/15ML PO SOLN
20.0000 g | Freq: Two times a day (BID) | ORAL | Status: DC
Start: 2021-10-16 — End: 2021-10-18
  Administered 2021-10-16 – 2021-10-18 (×5): 20 g via ORAL
  Filled 2021-10-16 (×5): qty 30

## 2021-10-16 MED ORDER — SENNOSIDES-DOCUSATE SODIUM 8.6-50 MG PO TABS
2.0000 | ORAL_TABLET | Freq: Two times a day (BID) | ORAL | Status: DC
  Administered 2021-10-16 – 2021-10-20 (×8): 2 via ORAL
  Filled 2021-10-16 (×11): qty 2

## 2021-10-16 MED ORDER — PROCHLORPERAZINE EDISYLATE 10 MG/2ML IJ SOLN
10.0000 mg | Freq: Four times a day (QID) | INTRAMUSCULAR | Status: DC | PRN
  Administered 2021-10-16: 10 mg via INTRAVENOUS
  Filled 2021-10-16: qty 2

## 2021-10-16 MED ORDER — POLYETHYLENE GLYCOL 3350 17 G PO PACK
17.0000 g | PACK | Freq: Two times a day (BID) | ORAL | Status: DC
  Administered 2021-10-16: 17 g via ORAL
  Filled 2021-10-16 (×2): qty 1

## 2021-10-16 MED ORDER — SODIUM CHLORIDE 0.9 % IV SOLN
510.0000 mg | Freq: Once | INTRAVENOUS | Status: AC
  Administered 2021-10-16: 510 mg via INTRAVENOUS
  Filled 2021-10-16: qty 17

## 2021-10-16 NOTE — TOC Progression Note (Signed)
Transition of Care [facility]) - Progression Note    Patient Details  Name: Jane Kennedy MRN: [id number] Date of Birth: May 13, 1945  Transition of Care [facility]) CM/SW Desha, LCSW Phone Number: 10/16/2021, 9:13 AM  Clinical Narrative:    CSW spoke w/ pt's daughter, Robbi Garter ([phone]) to re-discuss discharge plans for this pt. CSW provided information around what [facility] services could provide and reiterated recommendation for SNF. Pt's daughter does feel that this pt will need more care than what she will receive from [facility] services. Pt's daughter requested palliative be involved to explore possible hospice recommendations for this pt.    Expected Discharge Plan: [place] Barriers to Discharge: Continued Medical Work up  Expected Discharge Plan and Services Expected Discharge Plan: [place] In-house Referral: Clinical Social Work Discharge Planning Services: CM Consult Post Acute Care Choice: [place] arrangements for the past 2 months: Single Family Home                 DME Arranged: [place] rolling DME Agency: [employer] Date DME Agency Contacted: 10/04/21 Time DME Agency Contacted: [id number] Representative spoke with at DME Agency: [place]: PT [place]: [place] Date [place]: 10/04/21 Time [place]: 1300 Representative spoke with at [place]: Levada Dy   Social Determinants of Health ([place]) Interventions    Readmission Risk Interventions     No data to display

## 2021-10-16 NOTE — Progress Notes (Signed)
[facility] PROGRESS NOTE   Jane Kennedy ZTI:[id number] DOB: Feb 02, 1946 DOA: 09/30/2021  PCP: Houston Siren., MD  Brief History/Interval Summary: 76 year old  f w/ actinic keratosis, benign neoplasm of large intestine, carpal tunnel syndrome, DDD DDD lumbar sacral, diverticulosis, GERD, hyperlipidemia, IBS, osteopenia, vertigo, vitamin D deficiency presented with nausea vomiting, abdominal pain and elevated calcium level.  Recently diagnosed with metastatic renal cell carcinoma.  Consultants: Dr. Marin Olp with medical oncology  Procedures: Kyphoplasty L2    Subjective/Interval History: Patient noted to be mildly confused this morning.  Daughter is at the bedside.  According to nursing staff patient had nausea and vomiting this morning.    Assessment/Plan:  Hypercalcemia Secondary to malignancy.  Patient received Zometa and calcitonin.  Calcium was initially improving but then started rising again. Given another dose of Zometa on 6/16 and started on calcitonin by oncology.  Calcium levels noted to be slightly better today.  Continue to monitor.  Nausea and vomiting Abdomen is benign.  Reason for her symptoms not entirely clear.  Has not had a bowel movement in several days.  Could be constipated.  Treat symptomatically.  Bowel regimen.  Constipation Increase the dose of MiraLAX and Senokot. Based on previous notes patient was supposed to be on lactulose but its not listed on her schedule medication list.  Acute metabolic encephalopathy Noted to be mildly confused this morning.  Previously had elevated ammonia level 168 which improved to 36.  Will recheck ammonia level.  Resume lactulose.  Metastatic renal cell carcinoma Followed by Dr. Marin Olp.  Port-A-Cath placed for chemotherapy.  To start receiving treatment in the outpatient setting. Family interested in talking to palliative care as well.  Will consult.  Pathologic fracture L2 Status post kyphoplasty on 6/16.  It  looks like there is plan for radiation treatment to this area as per oncology notes.  Hyperammonemia Recheck levels she appears to be mildly confused this morning.  Abnormal LFTs Secondary to liver metastases.  Continue to monitor.  Hypokalemia Repleted.  Normocytic anemia/iron deficiency Likely due to chronic disease.  No evidence of overt bleeding. Iron infusion ordered by medical oncology.  Moderate protein calorie malnutrition Dietitian has been consulted.  DVT Prophylaxis: Lovenox Code Status: Full code Family Communication: Discussed with patient's daughter Disposition Plan: SNF  Status is: Inpatient Remains inpatient appropriate because: Hypercalcemia, encephalopathy    Medications: Scheduled:  Chlorhexidine Gluconate Cloth  6 each Topical Q0600   enoxaparin (LOVENOX) injection  40 mg Subcutaneous Q24H   feeding supplement  1 Container Oral TID BM   multivitamin with minerals  1 tablet Oral Daily   mouth rinse  15 mL Mouth Rinse 4 times per day   pantoprazole  40 mg Oral BID   polyethylene glycol  17 g Oral BID   senna-docusate  2 tablet Oral BID   sodium chloride flush  10-40 mL Intracatheter Q12H   sucralfate  1 g Oral TID WC & HS   Continuous:  sodium chloride 125 mL/hr at 10/16/21 8250   NLZ:[id number] **OR** acetaminophen, alum & mag hydroxide-simeth, HYDROmorphone (DILAUDID) injection, lidocaine, lip balm, LORazepam, meclizine, morphine, ondansetron **OR** ondansetron (ZOFRAN) IV, mouth rinse, polyethylene glycol, sodium chloride flush  Antibiotics: Anti-infectives (From admission, onward)    Start     Dose/Rate Route Frequency Ordered Stop   10/11/21 1500  ceFAZolin (ANCEF) IVPB 2g/100 mL premix        2 g 200 mL/hr over 30 Minutes Intravenous To Radiology 10/10/21 1440 10/11/21 1600   10/02/21 1000  rifaximin (XIFAXAN) tablet 550 mg  Status:  Discontinued        550 mg Oral 2 times daily 10/02/21 0732 10/05/21 0654       Objective:  Vital  Signs  Vitals:   10/15/21 0308 10/15/21 1422 10/15/21 1925 10/16/21 0311  BP: (!) 120/54 (!) 137/55 (!) 130/54 140/68  Pulse: (!) 105 (!) 109 (!) 105 (!) 107  Resp: '16 16 18 18  '$ Temp: 98.8 F (37.1 C) 98.7 F (37.1 C) 98.9 F (37.2 C) 98.1 F (36.7 C)  TempSrc: Axillary Oral Oral Oral  SpO2: 92% 93% 90% 90%  Weight:      Height:        Intake/Output Summary (Last 24 hours) at 10/16/2021 1118 Last data filed at 10/16/2021 0900 Gross per 24 hour  Intake 958.09 ml  Output 1000 ml  Net -41.91 ml   Filed Weights   09/30/21 1026 10/09/21 0800  Weight: 59 kg 60 kg    General appearance: Awake alert.  In no distress.  Distracted. Resp: Clear to auscultation bilaterally.  Normal effort Cardio: S1-S2 is normal regular.  No S3-S4.  No rubs murmurs or bruit GI: Abdomen is soft.  Nontender nondistended.  Bowel sounds are present normal.  No masses organomegaly Extremities: No edema.   Neurologic:   No focal neurological deficits.    Lab Results:  Data Reviewed: I have personally reviewed following labs and reports of the imaging studies  CBC: Recent Labs  Lab 10/11/21 0521 10/13/21 0407 10/14/21 0412 10/15/21 0320 10/16/21 0425  WBC 9.3 11.2* 10.4 8.3 8.8  NEUTROABS 6.7 9.1* 8.2* 6.3 7.4  HGB 10.9* 9.9* 9.4* 8.8* 9.6*  HCT 35.3* 32.3* 31.4* 29.3* 31.5*  MCV 83.3 83.0 83.3 83.7 83.8  PLT 247 246 231 217 035    Basic Metabolic Panel: Recent Labs  Lab 10/12/21 0559 10/13/21 0407 10/14/21 0412 10/15/21 0320 10/16/21 0425  NA 141 135 137 138 138  K 4.4 4.2 3.8 3.7 3.9  CL 107 103 104 107 108  CO2 '24 27 28 27 25  '$ GLUCOSE 100* 116* 111* 108* 120*  BUN 7* 7* 8 8 7*  CREATININE 0.62 0.60 0.49 0.50 0.56  CALCIUM 10.0 10.2 10.3 9.9 9.6    GFR: Estimated Creatinine Clearance: 49.5 mL/min (by C-G formula based on SCr of 0.56 mg/dL).  Liver Function Tests: Recent Labs  Lab 10/12/21 0559 10/13/21 0407 10/14/21 0412 10/15/21 0320 10/16/21 0425  AST 50* 42* 41  40 43*  ALT '15 17 19 18 19  '$ ALKPHOS 267* 311* 316* 329* 357*  BILITOT 0.7 0.7 0.7 0.5 0.9  PROT 6.0* 5.7* 5.7* 5.3* 5.7*  ALBUMIN 2.5* 2.2* 2.1* 2.0* 2.1*     Anemia Panel: Recent Labs    10/15/21 0858  TIBC 161*  IRON 13*    Radiology Studies: No results found.     LOS: 15 days   Jane Kennedy [employer] on [email or web address]  10/16/2021, 11:18 AM

## 2021-10-16 NOTE — Progress Notes (Addendum)
Physical Therapy Treatment Patient Details Name: Jane Kennedy MRN: [id number] DOB: May 14, 1945 Today's Date: 10/16/2021   History of Present Illness 76 year old female w/ PMHx significant for actinic keratosis, benign neoplasm of large intestine, carpal tunnel syndrome, DDD lumbar sacral, diverticulosis, GERD, hyperlipidemia, IBS, osteopenia, vertigo, vitamin D deficiency presented with nausea, vomiting, abdominal pain and elevated calcium level.   Evaluated outside facility was diagnosed with UTI, antibiotics prescribed.  She was not able to fill out prescription.  She was found to have potassium of 2.3, magnesium 1.7, phosphorus 2.6, calcium 12.1.  Patient admitted 09/30/21 for further treatment of hypercalcemia, hypokalemia in the setting of malignancy.  She has had worsening of confusion and delirium, agitated at times with tremors, was lethargic 6/6-CT head, ABG unrevealing. Suspect delirium multifactorial  related to acute illness, initial hypercalcemia, medications (receiving opioids, ativan), increase ammonia  level, started on lactulose.  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  Followed by Dr. Marin Olp.  Pt with Pathologic fracture L2 vertebrae now s/p kyphoplasty on 10/11/2021 by IR    PT Comments    Pt premedicated with Meclizine for session however still c/o nausea and had dry heaving during session.  Pt also limited by back pain upon sitting EOB.  Pt requiring increased time to process and answer questions.  Pt repeatedly asking for family or to call family since none were present in room.  Pt states her family will assist her upon d/c.  Recommend SNF at this time however per [facility] notes, pt and family prefer home.  Pt agreeable to continuing physical therapy and requests assist upon d/c.  Difficulty further assessing nausea and dizziness due to pt not providing description/information and prefers to keep eyes closed during session.  Pt would benefit  from palliative consult.     Recommendations for follow up therapy are one component of a multi-disciplinary discharge planning process, led by the attending physician.  Recommendations may be updated based on patient status, additional functional criteria and insurance authorization.  Follow Up Recommendations  Skilled nursing-short term rehab (<3 hours/day)     Assistance Recommended at Discharge Frequent or constant Supervision/Assistance  Patient can return home with the following A lot of help with walking and/or transfers;A lot of help with bathing/dressing/bathroom;Assistance with cooking/housework;Assistance with feeding;Direct supervision/assist for medications management;Direct supervision/assist for financial management;Assist for transportation;Help with stairs or ramp for entrance   Equipment Recommendations  Rolling walker (2 wheels)    Recommendations for Other Services       Precautions / Restrictions Precautions Precautions: Back;Fall Precaution Comments: hx vertigo, back for comfort s/p KP     Mobility  Bed Mobility Overal bed mobility: Needs Assistance Bed Mobility: Rolling, Sidelying to Sit, Sit to Supine Rolling: Min assist Sidelying to sit: Mod assist   Sit to supine: Mod assist   General bed mobility comments: pt requiring step by step instructions for log roll technique, pt takes frequent breaks however unable to explain why, does reports nausea and had dry heaving x2 during session, pt denies "dizziness", unable to tolerate sitting EOB due to back pain and required assist for safe return to supine    Transfers                        Ambulation/Gait                   Stairs             Wheelchair Mobility  Modified Rankin (Stroke Patients Only)       Balance       Sitting balance - Comments: pt unable to tolerate sitting EOB due to pain                                    Cognition  Arousal/Alertness: Awake/alert Behavior During Therapy: Flat affect Overall Cognitive Status: Within Functional Limits for tasks assessed                                 General Comments: pt mostly keeps eyes closed, difficulty to obtain information from pt, poor historian        Exercises      General Comments        Pertinent Vitals/Pain Pain Assessment Pain Assessment: Faces Faces Pain Scale: Hurts even more Pain Location: back Pain Descriptors / Indicators: Aching, Sore Pain Intervention(s): Repositioned, Monitored during session    Home Living                          Prior Function            PT Goals (current goals can now be found in the care plan section) Acute Rehab PT Goals PT Goal Formulation: With patient Time For Goal Achievement: 10/25/21 Potential to Achieve Goals: Good Progress towards PT goals: Progressing toward goals    Frequency    Min 2X/week      PT Plan Current plan remains appropriate    Co-evaluation              AM-PAC PT "6 Clicks" Mobility   Outcome Measure  Help needed turning from your back to your side while in a flat bed without using bedrails?: A Little Help needed moving from lying on your back to sitting on the side of a flat bed without using bedrails?: A Lot Help needed moving to and from a bed to a chair (including a wheelchair)?: A Lot Help needed standing up from a chair using your arms (e.g., wheelchair or bedside chair)?: A Lot Help needed to walk in hospital room?: Total Help needed climbing 3-5 steps with a railing? : Total 6 Click Score: 11    End of Session   Activity Tolerance: Treatment limited secondary to medical complications (Comment) (cognition, nausea, pain) Patient left: in bed;with call bell/phone within reach;with bed alarm set   PT Visit Diagnosis: Muscle weakness (generalized) (M62.81);Other abnormalities of gait and mobility (R26.89)     Time: 4287-6811 PT  Time Calculation (min) (ACUTE ONLY): 18 min  Charges:  $Therapeutic Activity: 8-22 mins                    Jannette Spanner PT, DPT Acute Rehabilitation Services Pager: [phone] Office: [place] 10/16/2021, 1:11 PM

## 2021-10-16 NOTE — Consult Note (Cosign Needed)
Consultation Note Date: 10/17/2021   Patient Name: Jane Kennedy  DOB: Feb 20, 1946  MRN: [id number]  Age / Sex: 76 y.o., female  PCP: Houston Siren., MD Referring Physician: Bonnielee Haff, MD  Reason for Consultation: Establishing goals of care  HPI/Patient Profile: 76 y.o. female  with past medical history of actinic keratosis, benign neoplasm of large intestine, carpal tunnel syndrome, lumbosacral DDD, diverticulosis, eczema, GERD, hyperlipidemia, impaired fasting glucose, IBS, osteopenia, seasonal allergies, vertigo, vitamin D deficiency admitted on 09/30/2021 with elevated calcium (s/p Zometa and calcitonin) with epigastric pain with nausea/emesis and confusion noted by family. ED visit to outside hospital prior to admission with concern for UTI with recently diagnosed left renal mass with bone and liver mets (confirmed renal cell carcinoma with biopsy). Hospitalization complicated by ongoing confusion, N/V, constipation, s/p L2 kyphoplasty 6/16, hyperammonemia.   Clinical Assessment and Goals of Care: I met today at Keyari's bedside along with daughter Sena Slate, brother Iona Beard, and granddaughter Haze Boyden. [name] is lying in bed. She appears somewhat restless and confused (pulling her sheets and gown off). Although she is confused she does continue to tell us she is tired and wants to go home. Family at bedside acknowledge that this has been her consistent wish and want to respect her wishes. We discussed hospice support at home and they agree with support to assist with managing symptoms to keep her comfortable while trying to optimize her time to spend with family. We also discussed hospice facility as family recognizes that they may not be able to care for at home very long but they do wish to respect her wish to return home. We discussed hospice support and how they can assist at home. Family are also interested in  supplementing care.   I spoke further with family while Merilyn was being cleaned. Sena Slate initially wished to continue radiation therapy with hopes that this will help with her mother's pain. I expressed my concern for the burden of trying to get her to radiation and concern this could take energy away that she could utilize to visit with family. I expressed my concern that the benefits may not outweigh the risks of pursuing radiation after discharge given her ongoing decline and complications during her prolonged hospital stay. We also discussed code status and they all agree that DNR best aligns with her wishes. They do not want her to suffer. They would like to continue to treat her hypercalcemia to optimize her prior to discharge. They will need time to get her house prepared for her to come home. Karynn has been caring for her husband for the past 10 years who died just 2 weeks before Vassie's hospital admission. Overall they desire for her to continue to be medically optimized prior to discharge but also to have symptom management to minimize suffering.   All questions/concerns addressed. Emotional support provided. Work note provided to granddaughter.   Primary Decision Maker NEXT OF KIN patient can contribute but more confused today; daughter is next of kin    SUMMARY OF RECOMMENDATIONS   -  DNR - Home with hospice likely Monday - Continue medical management to optimize and symptom management to minimize suffering  Code Status/Advance Care Planning: DNR   Symptom Management:  Pain: Roxanol 10 mg every 2 hours prn.  Constipation: Lactulose BID. May d/c senokot if she continues to have frequent BMs.  Nausea: May have Zofran ODT at home with phenergan suppository backup.   Palliative Prophylaxis:  Aspiration, Bowel Regimen, Delirium Protocol, Frequent Pain Assessment, and Turn Reposition  Psycho-social/Spiritual:  Desire for further Chaplaincy support:yes Additional Recommendations:  Grief/Bereavement Support  Prognosis:  Likely weeks or less when transitioned to comfort care at home.   Discharge Planning: Home with Hospice      Primary Diagnoses: Present on Admission:  Hypercalcemia  Gastroesophageal reflux disease with esophagitis  Hyperlipidemia, unspecified  Hypokalemia  Left kidney mass  Moderate protein malnutrition ([place])   I have reviewed the medical record, interviewed the patient and family, and examined the patient. The following aspects are pertinent.  Past Medical History:  Diagnosis Date   Cancer ([place])    Hyperlipidemia    Vertigo    Vitamin D deficiency 06/09/2015   Social History   Socioeconomic History   Marital status: Widowed    Spouse name: Not on file   Number of children: Not on file   Years of education: Not on file   Highest education level: Not on file  Occupational History   Not on file  Tobacco Use   Smoking status: Former    Packs/day: 1.00    Types: Cigarettes    Quit date: 2018    Years since quitting: 5.4   Smokeless tobacco: Never  Vaping Use   Vaping Use: Never used  Substance and Sexual Activity   Alcohol use: Never   Drug use: Never   Sexual activity: Not on file  Other Topics Concern   Not on file  Social History Narrative   Not on file   Social Determinants of Health   Financial Resource Strain: Not on file  Food Insecurity: Not on file  Transportation Needs: Not on file  Physical Activity: Not on file  Stress: Not on file  Social Connections: Not on file   Family History  Problem Relation Age of Onset   Migraines Mother    Multiple myeloma Mother    Diabetes Mellitus II Father    Stroke Maternal Grandfather    Colon cancer Maternal Aunt    Breast cancer Maternal Aunt    Scheduled Meds:  Chlorhexidine Gluconate Cloth  6 each Topical Q0600   enoxaparin (LOVENOX) injection  40 mg Subcutaneous Q24H   feeding supplement  1 Container Oral TID BM   lactulose  20 g Oral BID   multivitamin  with minerals  1 tablet Oral Daily   mouth rinse  15 mL Mouth Rinse 4 times per day   pantoprazole  40 mg Oral BID   polyethylene glycol  17 g Oral BID   senna-docusate  2 tablet Oral BID   sodium chloride flush  10-40 mL Intracatheter Q12H   sucralfate  1 g Oral TID WC & HS   Continuous Infusions:  sodium chloride 125 mL/hr at 10/16/21 0949   PRN Meds:.acetaminophen **OR** acetaminophen, alum & mag hydroxide-simeth, HYDROmorphone (DILAUDID) injection, lidocaine, lip balm, LORazepam, meclizine, morphine, ondansetron **OR** ondansetron (ZOFRAN) IV, mouth rinse, polyethylene glycol, prochlorperazine, sodium chloride flush Allergies  Allergen Reactions   Codeine Nausea And Vomiting   Nickel Rash    Patient states that nickel causes  her skin to turn green and breakout.   Review of Systems  Constitutional:  Positive for activity change, appetite change and fatigue.  Gastrointestinal:  Positive for nausea.  Musculoskeletal:  Positive for back pain.    Physical Exam Vitals and nursing note reviewed.  Constitutional:      Appearance: She is ill-appearing.  Cardiovascular:     Rate and Rhythm: Tachycardia present.  Pulmonary:     Effort: No tachypnea, accessory muscle usage or respiratory distress.  Abdominal:     Palpations: Abdomen is soft.  Neurological:     Mental Status: She is alert. She is confused.     Vital Signs: BP (!) 134/53 (BP Location: Left Arm)   Pulse (!) 109   Temp 98.4 F (36.9 C) (Oral)   Resp 18   Ht '5\' 3"'  (1.6 m)   Wt 60 kg   SpO2 92%   BMI 23.42 kg/m  Pain Scale: 0-10 POSS *See Group Information*: S-Acceptable,Sleep, easy to arouse Pain Score: Asleep   SpO2: SpO2: 92 % O2 Device:SpO2: 92 % O2 Flow Rate: .O2 Flow Rate (L/min): 2 L/min  IO: Intake/output summary:  Intake/Output Summary (Last 24 hours) at 10/16/2021 1614 Last data filed at 10/16/2021 0900 Gross per 24 hour  Intake 898.09 ml  Output 1000 ml  Net -101.91 ml    LBM: Last BM  Date : 10/11/21 Baseline Weight: Weight: 59 kg Most recent weight: Weight: 60 kg     Palliative Assessment/Data:     Time In: 0830  Time Total: 95 min  Greater than 50%  of this time was spent counseling and coordinating care related to the above assessment and plan.  Signed by: Vinie Sill, NP Palliative Medicine Team Pager # [phone] (M-F 8a-5p) Team Phone # [phone] (Nights/Weekends)

## 2021-10-16 NOTE — Progress Notes (Signed)
Palliative:  Family meeting coordinated with Ms. Handler, daughter, Sena Slate, and brother Iona Beard for tomorrow 6/22 0830 am. Ms. Nault is clear about her intentions to go home and be with her cats with the help of hospice. Iona Beard at bedside and then [place] over telephone confirms. I have reached out to [facility] with their permission to assist with coordination and recommendations for private caregivers as well. Full consultation to be completed in the morning.   No charge  Vinie Sill, NP Palliative Medicine Team Pager [phone] (Please see [email or web address] for schedule) Team Phone [phone]

## 2021-10-16 NOTE — Progress Notes (Signed)
Everything is about the same with Jane Kennedy.  She may have a little bit more alert and oriented this morning.  Her corrected calcium is 11.5.  I think she got some calcitonin yesterday.  She got Aranesp yesterday.  Hemoglobin is 9.6.  Her iron levels are low.  Iron saturation is only 8%.  I will give her a dose of Feraheme today.  She is not hurting.  She is eating.  I think she is being seen by dietary services.  I know that physical therapy and Occupational Therapy when trying to work with her.  Again, I am unsure if she can be able to go home.  I am unsure if family is going be able to take care of her at home.  I think we are looking into trying to get her into either rehab or some kind of skilled nursing.  She has had no problems with cough.  She still has a little heartburn.  She says there is no diarrhea.  She has had no fever.  There is been no obvious bleeding.  Her vital signs are temperature of 98.1.  Pulse 107.  Blood pressure 140/68.  Her head neck exam shows no oral lesions.  There is no thrush.  There is no adenopathy in the neck.  Lungs are clear bilaterally.  Cardiac exam shows a slightly tachycardic rate that is regular.  Abdomen is soft.  She has decent bowel sounds.  There is no fluid wave.  There is no palpable abdominal mass.  Neurological exam is nonfocal.  Again, Ms. Lacomb does need radiation therapy.  She needs this for her back.  She had her kyphoplasty back on Friday.  I think radiation will start this week.  We have to get on systemic therapy for the metastatic kidney cancer.  This will be immunotherapy.  We can do this in the office.  I do appreciate the incredible care that she is getting from the staff on 5 E.  Lattie Haw, MD  [place] 10:35-36

## 2021-10-17 ENCOUNTER — Encounter (HOSPITAL_COMMUNITY): Payer: Self-pay | Admitting: Hematology & Oncology

## 2021-10-17 DIAGNOSIS — G9341 Metabolic encephalopathy: Secondary | ICD-10-CM | POA: Diagnosis not present

## 2021-10-17 DIAGNOSIS — Z66 Do not resuscitate: Secondary | ICD-10-CM | POA: Diagnosis not present

## 2021-10-17 DIAGNOSIS — Z7189 Other specified counseling: Secondary | ICD-10-CM | POA: Diagnosis not present

## 2021-10-17 DIAGNOSIS — Z515 Encounter for palliative care: Secondary | ICD-10-CM

## 2021-10-17 DIAGNOSIS — C7951 Secondary malignant neoplasm of bone: Secondary | ICD-10-CM | POA: Diagnosis not present

## 2021-10-17 DIAGNOSIS — E876 Hypokalemia: Secondary | ICD-10-CM | POA: Diagnosis not present

## 2021-10-17 LAB — CBC
HCT: 31.6 % — ABNORMAL LOW (ref 36.0–46.0)
Hemoglobin: 9.5 g/dL — ABNORMAL LOW (ref 12.0–15.0)
MCH: 25.1 pg — ABNORMAL LOW (ref 26.0–34.0)
MCHC: 30.1 g/dL (ref 30.0–36.0)
MCV: 83.4 fL (ref 80.0–100.0)
Platelets: 277 10*3/uL (ref 150–400)
RBC: 3.79 MIL/uL — ABNORMAL LOW (ref 3.87–5.11)
RDW: 18.3 % — ABNORMAL HIGH (ref 11.5–15.5)
WBC: 9.9 10*3/uL (ref 4.0–10.5)
nRBC: 0 % (ref 0.0–0.2)

## 2021-10-17 LAB — TSH: TSH: 2.382 u[IU]/mL (ref 0.350–4.500)

## 2021-10-17 LAB — BASIC METABOLIC PANEL WITH GFR
Anion gap: 5 (ref 5–15)
BUN: 6 mg/dL — ABNORMAL LOW (ref 8–23)
CO2: 25 mmol/L (ref 22–32)
Calcium: 9.3 mg/dL (ref 8.9–10.3)
Chloride: 109 mmol/L (ref 98–111)
Creatinine, Ser: 0.45 mg/dL (ref 0.44–1.00)
GFR, Estimated: >60 mL/min
Glucose, Bld: 120 mg/dL — ABNORMAL HIGH (ref 70–99)
Potassium: 3.2 mmol/L — ABNORMAL LOW (ref 3.5–5.1)
Sodium: 139 mmol/L (ref 135–145)

## 2021-10-17 LAB — PREALBUMIN: Prealbumin: 5.8 mg/dL — ABNORMAL LOW (ref 18–38)

## 2021-10-17 LAB — AMMONIA: Ammonia: 39 umol/L — ABNORMAL HIGH (ref 9–35)

## 2021-10-17 MED ORDER — POTASSIUM CHLORIDE IN NACL 20-0.9 MEQ/L-% IV SOLN
INTRAVENOUS | Status: DC
  Filled 2021-10-17 (×7): qty 1000

## 2021-10-17 MED ORDER — POTASSIUM CHLORIDE CRYS ER 20 MEQ PO TBCR
40.0000 meq | EXTENDED_RELEASE_TABLET | Freq: Once | ORAL | Status: AC
  Administered 2021-10-17: 40 meq via ORAL
  Filled 2021-10-17: qty 2

## 2021-10-17 MED ORDER — MORPHINE SULFATE (CONCENTRATE) 10 MG/0.5ML PO SOLN
10.0000 mg | ORAL | Status: DC | PRN
  Administered 2021-10-17: 10 mg via SUBLINGUAL
  Filled 2021-10-17: qty 0.5

## 2021-10-17 MED ORDER — MORPHINE SULFATE (CONCENTRATE) 10 MG/0.5ML PO SOLN
10.0000 mg | ORAL | Status: DC | PRN
  Administered 2021-10-17 – 2021-10-20 (×12): 10 mg via SUBLINGUAL
  Filled 2021-10-17 (×12): qty 0.5

## 2021-10-17 NOTE — TOC Transition Note (Signed)
Transition of Care [facility]) - CM/SW Discharge Note   Patient Details  Name: Jane Kennedy MRN: [id number] Date of Birth: 11-Apr-1946  Transition of Care [facility]) CM/SW Contact:  Vassie Moselle, LCSW Phone Number: 10/17/2021, 11:25 AM   Clinical Narrative:    Pt and family met with palliative care to discuss plans for this pt's care and discharge. Pt and family have decided for pt to return home with hospice care. A referral has been made to [facility] who will follow this pt for discharge plans and care at home. CSW cancelled [facility] services for pt.  No further TOC needs identified at this time.    Final next level of care: Home w Hospice Care Barriers to Discharge: Continued Medical Work up   Patient Goals and CMS Choice Patient states their goals for this hospitalization and ongoing recovery are:: Return home   Choice offered to / list presented to : Patient  Discharge Placement                       Discharge Plan and Services In-house Referral: Clinical Social Work Discharge Planning Services: CM Consult Post Acute Care Choice: Home Health          DME Arranged: Gilford Rile rolling DME Agency: [employer] Date DME Agency Contacted: 10/04/21 Time DME Agency Contacted: [id number] Representative spoke with at DME Agency: [place]: NA [place] Agency: NA Date [place]: 10/04/21 Time [place]: 1300 Representative spoke with at [place]: Levada Dy  Social Determinants of Health ([place]) Interventions     Readmission Risk Interventions     No data to display

## 2021-10-17 NOTE — Progress Notes (Signed)
[facility] PROGRESS NOTE   Jane Kennedy ZOX:[id number] DOB: 20-Oct-1945 DOA: 09/30/2021  PCP: Houston Siren., MD  Brief History/Interval Summary: 76 year old  f w/ actinic keratosis, benign neoplasm of large intestine, carpal tunnel syndrome, DDD DDD lumbar sacral, diverticulosis, GERD, hyperlipidemia, IBS, osteopenia, vertigo, vitamin D deficiency presented with nausea vomiting, abdominal pain and elevated calcium level.  Recently diagnosed with metastatic renal cell carcinoma.  Consultants:  Dr. Marin Olp with medical oncology Palliative care  Procedures: Kyphoplasty L2    Subjective/Interval History: Patient noted to be distracted this morning.  No distress appreciated.  Denies any complaints otherwise.     Assessment/Plan:  Hypercalcemia Secondary to malignancy.  Patient received Zometa and calcitonin.  Calcium was initially improving but then started rising again. Given another dose of Zometa on 6/16 and started on calcitonin by oncology.  Calcium level has improved.  Corrected calcium is 10.8 today.  Nausea and vomiting Nausea vomiting appears to have improved.  Abdominal films did not show any acute findings.  Abdomen remains benign on examination.  Continue bowel regimen.  Constipation Continue MiraLAX and Senokot.  Also on lactulose.  Did have bowel movements yesterday.  Acute metabolic encephalopathy Noted to have some degree of confusion this morning.  Ammonia level is only 39.  No focal neurological deficits noted.   Reason for her confusion as not entirely clear.  Recent MRI done in May did not show any evidence for intracranial metastatic disease.  No clear indication to repeat at this time.  CT head done on June 6 did not show any acute findings either.    Metastatic renal cell carcinoma Followed by Dr. Marin Olp.  Port-A-Cath placed for chemotherapy.  Plan was to start systemic therapy in the outpatient setting though it appears that patient and family are  more interested in hospice at this time.  Pathologic fracture L2 Status post kyphoplasty on 6/16.  It looks like there is plan for radiation treatment to this area as per oncology notes.  Hyperammonemia Ammonia levels are stable and improved compared to before.  Abnormal LFTs Secondary to liver metastases.  Continue to monitor.  Hypokalemia We will replace potassium.  Normocytic anemia/iron deficiency Likely due to chronic disease.  No evidence of overt bleeding. Iron infusion ordered by medical oncology.  Moderate protein calorie malnutrition Dietitian has been consulted.  Goals of care Palliative care to have family meeting this morning.  Looks like patient and family are more interested in hospice approach.  DVT Prophylaxis: Lovenox Code Status: Full code Family Communication: No family at bedside this morning. Disposition Plan: Patient wants to go home although SNF is recommended by physical therapy.  Status is: Inpatient Remains inpatient appropriate because: Hypercalcemia, encephalopathy    Medications: Scheduled:  Chlorhexidine Gluconate Cloth  6 each Topical Q0600   enoxaparin (LOVENOX) injection  40 mg Subcutaneous Q24H   feeding supplement  1 Container Oral TID BM   lactulose  20 g Oral BID   multivitamin with minerals  1 tablet Oral Daily   mouth rinse  15 mL Mouth Rinse 4 times per day   pantoprazole  40 mg Oral BID   polyethylene glycol  17 g Oral BID   senna-docusate  2 tablet Oral BID   sodium chloride flush  10-40 mL Intracatheter Q12H   sucralfate  1 g Oral TID WC & HS   Continuous:  0.9 % NaCl with KCl 20 mEq / L 75 mL/hr at 10/17/21 0753   WJX:BJYNWGNFAOZHY **OR** acetaminophen, alum & mag  hydroxide-simeth, lidocaine, lip balm, meclizine, morphine CONCENTRATE, ondansetron **OR** ondansetron (ZOFRAN) IV, mouth rinse, polyethylene glycol, sodium chloride flush  Antibiotics: Anti-infectives (From admission, onward)    Start     Dose/Rate Route  Frequency Ordered Stop   10/11/21 1500  ceFAZolin (ANCEF) IVPB 2g/100 mL premix        2 g 200 mL/hr over 30 Minutes Intravenous To Radiology 10/10/21 1440 10/11/21 1600   10/02/21 1000  rifaximin (XIFAXAN) tablet 550 mg  Status:  Discontinued        550 mg Oral 2 times daily 10/02/21 0732 10/05/21 0654       Objective:  Vital Signs  Vitals:   10/16/21 0311 10/16/21 1406 10/16/21 2033 10/17/21 0504  BP: 140/68 (!) 134/53 (!) 133/56 (!) 123/56  Pulse: (!) 107 (!) 109 (!) 101 (!) 106  Resp: '18 18 16 16  '$ Temp: 98.1 F (36.7 C) 98.4 F (36.9 C) 98.2 F (36.8 C) 98.1 F (36.7 C)  TempSrc: Oral Oral Oral Oral  SpO2: 90% 92% 94% 94%  Weight:      Height:        Intake/Output Summary (Last 24 hours) at 10/17/2021 1108 Last data filed at 10/17/2021 0948 Gross per 24 hour  Intake 220 ml  Output 500 ml  Net -280 ml    Filed Weights   09/30/21 1026 10/09/21 0800  Weight: 59 kg 60 kg    General appearance: Awake alert.  In no distress.  Distracted Resp: Clear to auscultation bilaterally.  Normal effort Cardio: S1-S2 is normal regular.  No S3-S4.  No rubs murmurs or bruit GI: Abdomen is soft.  Nontender nondistended.  Bowel sounds are present normal.  No masses organomegaly Extremities: No edema.   Neurologic: No focal neurological deficits.     Lab Results:  Data Reviewed: I have personally reviewed following labs and reports of the imaging studies  CBC: Recent Labs  Lab 10/11/21 0521 10/13/21 0407 10/14/21 0412 10/15/21 0320 10/16/21 0425 10/17/21 0356  WBC 9.3 11.2* 10.4 8.3 8.8 9.9  NEUTROABS 6.7 9.1* 8.2* 6.3 7.4  --   HGB 10.9* 9.9* 9.4* 8.8* 9.6* 9.5*  HCT 35.3* 32.3* 31.4* 29.3* 31.5* 31.6*  MCV 83.3 83.0 83.3 83.7 83.8 83.4  PLT 247 246 231 217 282 277     Basic Metabolic Panel: Recent Labs  Lab 10/13/21 0407 10/14/21 0412 10/15/21 0320 10/16/21 0425 10/17/21 0356  NA 135 137 138 138 139  K 4.2 3.8 3.7 3.9 3.2*  CL 103 104 107 108 109  CO2  '27 28 27 25 25  '$ GLUCOSE 116* 111* 108* 120* 120*  BUN 7* 8 8 7* 6*  CREATININE 0.60 0.49 0.50 0.56 0.45  CALCIUM 10.2 10.3 9.9 9.6 9.3     GFR: Estimated Creatinine Clearance: 49.5 mL/min (by C-G formula based on SCr of 0.45 mg/dL).  Liver Function Tests: Recent Labs  Lab 10/12/21 0559 10/13/21 0407 10/14/21 0412 10/15/21 0320 10/16/21 0425  AST 50* 42* 41 40 43*  ALT '15 17 19 18 19  '$ ALKPHOS 267* 311* 316* 329* 357*  BILITOT 0.7 0.7 0.7 0.5 0.9  PROT 6.0* 5.7* 5.7* 5.3* 5.7*  ALBUMIN 2.5* 2.2* 2.1* 2.0* 2.1*      Anemia Panel: Recent Labs    10/15/21 0858  TIBC 161*  IRON 13*     Radiology Studies: DG Abd Portable 2V  Result Date: 10/16/2021 CLINICAL DATA:  Abdominal pain EXAM: PORTABLE ABDOMEN - 2 VIEW COMPARISON:  None Available. FINDINGS: Nonobstructive  bowel-gas pattern. Visualized lung bases are clear. Prior bilateral total hip arthroplasties. Moderate to severe degenerative changes of the lumbar spine with prior L2 vertebroplasty. IMPRESSION: Nonobstructive bowel gas pattern. Electronically Signed   By: Yetta Glassman M.D.   On: 10/16/2021 16:39       LOS: 16 days   Angel Weedon [employer] Pager on [email or web address]  10/17/2021, 11:08 AM

## 2021-10-17 NOTE — Progress Notes (Addendum)
Unfortunately, Jane Kennedy is totally confused this morning.  She did not know where she was.  She did not know the date or year.  She kept talking about things that were very unusual.  She clearly has some count of metabolic encephalopathy.  She does not have a high ammonia level.  I really do not think that is the issue.  Her calcium should be fine.  I do not know if medications might be doing this.  I would stop her pain medications.  I will stop her Ativan.  She clearly does not appear to be in alot of pain.  We did give her iron yesterday.  Her hemoglobin is holding steady.  I just hate the fact that we are  starting to "lose ground.".  I do not know if she would even be able to manage doing radiation therapy for her back.  She clearly needs radiation therapy for her spine to help with the pain issues.  I had a long talk with her daughter this morning on the phone.  I just hated the fact that Jane Kennedy as such significant disorientation right now.  I told her daughter that I was not sure that we would ever be able to treat her systemically for the kidney cancer.  It is possible that this may be some type of paraneoplastic effect of her cancer.  It would be nice to get a MRI of the brain but I do not think that she would cooperate for this.  I do not think she is really eating much.  She is not getting out of bed.  I know that they are meeting with Palliative Care today.  I saw a mention of Hospice.  We are at the point that we might have to think about Hospice for her if she does not improve her overall status.  I would have to think that her performance status right now is probably ECOG 3, at best.  I will check to make sure there is no infection.  I would not think this would be the case.  However, I have seen marked confusion with infection before.  I would also check her thyroid.  Her vital signs are all pretty stable.  Temperature 98.1.  Pulse 106.  Blood pressure 123/56.  Her lungs  sound clear.  She has good air movement bilaterally.  Her oxygen saturation is 94%.  Her cardiac exam is slightly tachycardic but regular.  Abdomen is soft.  There is no tenderness.  There is no fluid wave.  I really cannot palpate her liver edge.  Extremity shows no clubbing, cyanosis or edema.  Neurological exam shows the disorientation.  There is no focal neurological deficits.  Again, Ms. Melecio is dealing with this obviously aggressive renal cell carcinoma.  This is a high-grade tumor.  I have to believe that is somehow causing her disorientation.  Again, an MRI of the brain would be nice to get but, I am not sure she would cooperate for this.  We can certainly adjust her medications.  If we are looking at Hospice, I would have no problems with this.  I think we will have to address end-of-life issues if that is the case.  This really is disappointing to me.  When I first saw her, she looked really good.  She was totally alert and oriented.  Now, and this is really not even a month later, she is just declined significantly.  We will continue to work hard to  try to get her to have some quality of life.  I think she deserves this.  I know the staff on [address] are doing a fantastic job trying to help.  I appreciate their compassion.  Lattie Haw, MD  Jeneen Rinks 1:5-7

## 2021-10-17 NOTE — Progress Notes (Signed)
Chaplain engaged in a follow-up visit with Sariyah and her brother.  Brother noted that Smith is the [place] of their family.  He explained that Orlean has been like a second mom to him with them being 8 years apart.  Kayna is well loved and supported as the head of her family.  She has a daughter, grandchildren, and siblings that provide her support and plan on being by her side as she transitions home.    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  Chaplain offered reflective listening and a compassionate presence during this time.  They also shared about Merrilyn's love of cats.  She is looking forward to getting home and being with them.   Chaplain offered support and is available to follow-up as needed.    10/17/21 1100  Clinical Encounter Type  Visited With Patient and family together  Visit Type Follow-up

## 2021-10-18 DIAGNOSIS — Z7189 Other specified counseling: Secondary | ICD-10-CM | POA: Diagnosis not present

## 2021-10-18 DIAGNOSIS — Z515 Encounter for palliative care: Secondary | ICD-10-CM | POA: Diagnosis not present

## 2021-10-18 DIAGNOSIS — E876 Hypokalemia: Secondary | ICD-10-CM | POA: Diagnosis not present

## 2021-10-18 DIAGNOSIS — G9341 Metabolic encephalopathy: Secondary | ICD-10-CM | POA: Diagnosis not present

## 2021-10-18 DIAGNOSIS — C7951 Secondary malignant neoplasm of bone: Secondary | ICD-10-CM | POA: Diagnosis not present

## 2021-10-18 LAB — MAGNESIUM: Magnesium: 1.8 mg/dL (ref 1.7–2.4)

## 2021-10-18 LAB — BASIC METABOLIC PANEL
Anion gap: 3 — ABNORMAL LOW (ref 5–15)
BUN: 5 mg/dL — ABNORMAL LOW (ref 8–23)
CO2: 23 mmol/L (ref 22–32)
Calcium: 9.2 mg/dL (ref 8.9–10.3)
Chloride: 111 mmol/L (ref 98–111)
Creatinine, Ser: 0.47 mg/dL (ref 0.44–1.00)
GFR, Estimated: >60 mL/min (ref >60)
Glucose, Bld: 105 mg/dL — ABNORMAL HIGH (ref 70–99)
Potassium: 4 mmol/L (ref 3.5–5.1)
Sodium: 137 mmol/L (ref 135–145)

## 2021-10-18 MED ORDER — LACTULOSE 10 GM/15ML PO SOLN
30.0000 g | Freq: Two times a day (BID) | ORAL | Status: DC
  Administered 2021-10-18 – 2021-10-20 (×4): 30 g via ORAL
  Filled 2021-10-18 (×5): qty 45

## 2021-10-19 DIAGNOSIS — G9341 Metabolic encephalopathy: Secondary | ICD-10-CM | POA: Diagnosis not present

## 2021-10-19 DIAGNOSIS — Z515 Encounter for palliative care: Secondary | ICD-10-CM | POA: Diagnosis not present

## 2021-10-19 DIAGNOSIS — C7951 Secondary malignant neoplasm of bone: Secondary | ICD-10-CM | POA: Diagnosis not present

## 2021-10-19 DIAGNOSIS — Z7189 Other specified counseling: Secondary | ICD-10-CM | POA: Diagnosis not present

## 2021-10-19 DIAGNOSIS — C649 Malignant neoplasm of unspecified kidney, except renal pelvis: Secondary | ICD-10-CM | POA: Diagnosis not present

## 2021-10-19 MED ORDER — PANTOPRAZOLE SODIUM 40 MG PO TBEC
40.0000 mg | DELAYED_RELEASE_TABLET | Freq: Every day | ORAL | Status: DC
  Administered 2021-10-19 – 2021-10-21 (×2): 40 mg via ORAL
  Filled 2021-10-19 (×3): qty 1

## 2021-10-19 MED ORDER — ONDANSETRON HCL 8 MG PO TABS: 8.0000 mg | ORAL_TABLET | Freq: Three times a day (TID) | ORAL | 0 refills | Status: AC | PRN

## 2021-10-19 MED ORDER — MECLIZINE HCL 25 MG PO TABS
25.0000 mg | ORAL_TABLET | Freq: Three times a day (TID) | ORAL | 0 refills | Status: AC | PRN
Start: 2021-10-19 — End: ?

## 2021-10-19 MED ORDER — LACTULOSE 10 GM/15ML PO SOLN: 30.0000 g | Freq: Two times a day (BID) | ORAL | 1 refills | Status: AC

## 2021-10-19 MED ORDER — METHOCARBAMOL 500 MG PO TABS
500.0000 mg | ORAL_TABLET | Freq: Three times a day (TID) | ORAL | Status: DC | PRN
Start: 2021-10-19 — End: 2021-10-21
  Administered 2021-10-19 – 2021-10-20 (×2): 500 mg via ORAL
  Filled 2021-10-19 (×2): qty 1

## 2021-10-19 MED ORDER — METHOCARBAMOL 500 MG PO TABS: 500.0000 mg | ORAL_TABLET | Freq: Three times a day (TID) | ORAL | 2 refills | Status: AC | PRN

## 2021-10-19 MED ORDER — SENNOSIDES-DOCUSATE SODIUM 8.6-50 MG PO TABS: 2.0000 | ORAL_TABLET | Freq: Two times a day (BID) | ORAL | 2 refills | Status: AC

## 2021-10-19 MED ORDER — MORPHINE SULFATE (CONCENTRATE) 10 MG/0.5ML PO SOLN: 10.0000 mg | ORAL | 0 refills | Status: DC | PRN

## 2021-10-19 NOTE — Progress Notes (Signed)
Jane Kennedy may have a little bit more orientation today.  Her sister was then from [place].  Jane Kennedy tried physical therapy yesterday.  Unfortunately, she really did not do all that well with this.  I know that we are going to try to go for Hospice.  He would be nice if we could try some radiation therapy for the back to help with any kind of pain.  However, I am not sure that she would be able to cooperate because of the intermittent confusion.  I am not sure when she is eating.  Hopefully, with her family with her, she will be able to eat a little bit better.  Again, our focus is pain.  Hopefully, we can help with pain ease.  I think that she does have some sensitivity to pain medication.  There is no lab work back yet today.  Her vital signs are all pretty stable.  Temperature 98.8.  Pulse 97.  Blood pressure 115/48.  Overall, there is really no change in her physical exam.  I think the goal is for her to have Hospice.  Again, be nice if she could have some radiation to her back which would help improve her mobility I think.  Again I do not know if this can be done.  I has to know if she would cooperate for this.  I do appreciate the great care that she is getting from all the staff up on [facility].  Christin Bach, MD  Philippians 4:6

## 2021-10-19 NOTE — Progress Notes (Signed)
Pt sister refused bed alarm. Pt sister educated on purpose of bed alarm (due to pt being a high fall risk), and fall precautions. Pt sister verbalized understanding.

## 2021-10-20 DIAGNOSIS — C7951 Secondary malignant neoplasm of bone: Secondary | ICD-10-CM | POA: Diagnosis not present

## 2021-10-20 DIAGNOSIS — C649 Malignant neoplasm of unspecified kidney, except renal pelvis: Secondary | ICD-10-CM | POA: Diagnosis not present

## 2021-10-20 DIAGNOSIS — G9341 Metabolic encephalopathy: Secondary | ICD-10-CM | POA: Diagnosis not present

## 2021-10-20 LAB — CBC WITH DIFFERENTIAL/PLATELET
Abs Immature Granulocytes: 0.08 10*3/uL — ABNORMAL HIGH (ref 0.00–0.07)
Basophils Absolute: 0 10*3/uL (ref 0.0–0.1)
Basophils Relative: 0 %
Eosinophils Absolute: 0.2 10*3/uL (ref 0.0–0.5)
Eosinophils Relative: 2 %
HCT: 32.4 % — ABNORMAL LOW (ref 36.0–46.0)
Hemoglobin: 9.5 g/dL — ABNORMAL LOW (ref 12.0–15.0)
Immature Granulocytes: 1 %
Lymphocytes Relative: 17 %
Lymphs Abs: 1.4 10*3/uL (ref 0.7–4.0)
MCH: 25.3 pg — ABNORMAL LOW (ref 26.0–34.0)
MCHC: 29.3 g/dL — ABNORMAL LOW (ref 30.0–36.0)
MCV: 86.4 fL (ref 80.0–100.0)
Monocytes Absolute: 0.8 10*3/uL (ref 0.1–1.0)
Monocytes Relative: 10 %
Neutro Abs: 5.6 10*3/uL (ref 1.7–7.7)
Neutrophils Relative %: 70 %
Platelets: 274 10*3/uL (ref 150–400)
RBC: 3.75 MIL/uL — ABNORMAL LOW (ref 3.87–5.11)
RDW: 20.3 % — ABNORMAL HIGH (ref 11.5–15.5)
WBC: 8.1 10*3/uL (ref 4.0–10.5)
nRBC: 0 % (ref 0.0–0.2)

## 2021-10-20 LAB — COMPREHENSIVE METABOLIC PANEL
ALT: 18 U/L (ref 0–44)
AST: 46 U/L — ABNORMAL HIGH (ref 15–41)
Albumin: 2 g/dL — ABNORMAL LOW (ref 3.5–5.0)
Alkaline Phosphatase: 357 U/L — ABNORMAL HIGH (ref 38–126)
Anion gap: 6 (ref 5–15)
BUN: <5 mg/dL — ABNORMAL LOW (ref 8–23)
CO2: 25 mmol/L (ref 22–32)
Calcium: 9.9 mg/dL (ref 8.9–10.3)
Chloride: 111 mmol/L (ref 98–111)
Creatinine, Ser: 0.54 mg/dL (ref 0.44–1.00)
GFR, Estimated: >60 mL/min (ref >60)
Glucose, Bld: 106 mg/dL — ABNORMAL HIGH (ref 70–99)
Potassium: 3.9 mmol/L (ref 3.5–5.1)
Sodium: 142 mmol/L (ref 135–145)
Total Bilirubin: 0.5 mg/dL (ref 0.3–1.2)
Total Protein: 5.4 g/dL — ABNORMAL LOW (ref 6.5–8.1)

## 2021-10-20 LAB — PREALBUMIN: Prealbumin: 5.5 mg/dL — ABNORMAL LOW (ref 18–38)

## 2021-10-20 MED ORDER — MORPHINE SULFATE (CONCENTRATE) 10 MG/0.5ML PO SOLN
20.0000 mg | ORAL | Status: DC | PRN
  Administered 2021-10-20 – 2021-10-21 (×5): 20 mg via SUBLINGUAL
  Filled 2021-10-20 (×5): qty 1

## 2021-10-20 MED ORDER — MORPHINE SULFATE (PF) 2 MG/ML IV SOLN
2.0000 mg | INTRAVENOUS | Status: DC | PRN
  Administered 2021-10-20 (×3): 2 mg via INTRAVENOUS
  Administered 2021-10-21 (×3): 4 mg via INTRAVENOUS
  Filled 2021-10-20: qty 1
  Filled 2021-10-20: qty 2
  Filled 2021-10-20 (×2): qty 1
  Filled 2021-10-20 (×2): qty 2

## 2021-10-21 DIAGNOSIS — C7951 Secondary malignant neoplasm of bone: Secondary | ICD-10-CM | POA: Diagnosis not present

## 2021-10-21 DIAGNOSIS — C649 Malignant neoplasm of unspecified kidney, except renal pelvis: Secondary | ICD-10-CM | POA: Diagnosis not present

## 2021-10-21 LAB — CBC WITH DIFFERENTIAL/PLATELET
Abs Immature Granulocytes: 0.14 10*3/uL — ABNORMAL HIGH (ref 0.00–0.07)
Basophils Absolute: 0 10*3/uL (ref 0.0–0.1)
Basophils Relative: 0 %
Eosinophils Absolute: 0.2 10*3/uL (ref 0.0–0.5)
Eosinophils Relative: 2 %
HCT: 33.4 % — ABNORMAL LOW (ref 36.0–46.0)
Hemoglobin: 9.6 g/dL — ABNORMAL LOW (ref 12.0–15.0)
Immature Granulocytes: 2 %
Lymphocytes Relative: 14 %
Lymphs Abs: 1.2 10*3/uL (ref 0.7–4.0)
MCH: 25.1 pg — ABNORMAL LOW (ref 26.0–34.0)
MCHC: 28.7 g/dL — ABNORMAL LOW (ref 30.0–36.0)
MCV: 87.4 fL (ref 80.0–100.0)
Monocytes Absolute: 0.7 10*3/uL (ref 0.1–1.0)
Monocytes Relative: 9 %
Neutro Abs: 6.3 10*3/uL (ref 1.7–7.7)
Neutrophils Relative %: 73 %
Platelets: 277 10*3/uL (ref 150–400)
RBC: 3.82 MIL/uL — ABNORMAL LOW (ref 3.87–5.11)
RDW: 20.6 % — ABNORMAL HIGH (ref 11.5–15.5)
WBC: 8.6 10*3/uL (ref 4.0–10.5)
nRBC: 0.2 % (ref 0.0–0.2)

## 2021-10-21 LAB — COMPREHENSIVE METABOLIC PANEL
ALT: 16 U/L (ref 0–44)
AST: 41 U/L (ref 15–41)
Albumin: 2 g/dL — ABNORMAL LOW (ref 3.5–5.0)
Alkaline Phosphatase: 316 U/L — ABNORMAL HIGH (ref 38–126)
Anion gap: 5 (ref 5–15)
BUN: 5 mg/dL — ABNORMAL LOW (ref 8–23)
CO2: 26 mmol/L (ref 22–32)
Calcium: 10.1 mg/dL (ref 8.9–10.3)
Chloride: 110 mmol/L (ref 98–111)
Creatinine, Ser: 0.44 mg/dL (ref 0.44–1.00)
GFR, Estimated: >60 mL/min (ref >60)
Glucose, Bld: 116 mg/dL — ABNORMAL HIGH (ref 70–99)
Potassium: 4 mmol/L (ref 3.5–5.1)
Sodium: 141 mmol/L (ref 135–145)
Total Bilirubin: 0.6 mg/dL (ref 0.3–1.2)
Total Protein: 5.4 g/dL — ABNORMAL LOW (ref 6.5–8.1)

## 2021-10-21 MED ORDER — MORPHINE SULFATE ER 30 MG PO TBCR
30.0000 mg | EXTENDED_RELEASE_TABLET | Freq: Two times a day (BID) | ORAL | Status: DC
  Administered 2021-10-21: 30 mg via ORAL
  Filled 2021-10-21: qty 1

## 2021-10-21 MED ORDER — MORPHINE SULFATE (PF) 2 MG/ML IV SOLN: 2.0000 mg | INTRAVENOUS | 0 refills | Status: AC | PRN

## 2021-10-21 MED ORDER — ZOLEDRONIC ACID 4 MG/5ML IV CONC
4.0000 mg | Freq: Once | INTRAVENOUS | Status: AC
  Administered 2021-10-21: 4 mg via INTRAVENOUS
  Filled 2021-10-21: qty 5

## 2021-10-21 MED ORDER — KETOROLAC TROMETHAMINE 30 MG/ML IJ SOLN
30.0000 mg | Freq: Three times a day (TID) | INTRAMUSCULAR | Status: DC
  Administered 2021-10-21: 30 mg via INTRAVENOUS
  Filled 2021-10-21: qty 1

## 2021-10-21 MED ORDER — MORPHINE SULFATE (CONCENTRATE) 10 MG/0.5ML PO SOLN
20.0000 mg | ORAL | 0 refills | Status: AC | PRN
Start: 2021-10-21 — End: ?

## 2021-10-21 MED ORDER — MORPHINE SULFATE ER 30 MG PO TBCR: 30.0000 mg | EXTENDED_RELEASE_TABLET | Freq: Two times a day (BID) | ORAL | 0 refills | Status: AC

## 2021-10-21 NOTE — Progress Notes (Signed)
Unfortunately, Jane Kennedy is declining very quickly.  She not really cannot sit up.  She is having severe pain in her back.  She had the kyphoplasty at L2.  I am not sure what else could be going on in the back.  I still think she is able to go for x-rays.  I do not think she is even able to have radiation treatments.  I think it is apparent that she has not come be able to go home.  I really think that she is going need to go to Hospice home.  I think [facility] would really be a good option for if they have a bed.  We will try her on MS Contin which is time-released.  Maybe, this will help with some of the discomfort that she has.  She is not eating much.  Again she is really not able to get up or sit up because of the pain.  This seemed to worsen overnight.  She has a little bit of orientation.  Once again, her calcium is creeping back up.  It is now 10.1.  With an albumin of 2, the corrected calcium is 12.  I think the key blood test has been the prealbumin.  It is only 5.5.  Her white cell count is 8.6.  Hemoglobin 9.6.  Platelet count 277,000.  Again, her condition has deteriorated.  This pain is really a problem for her.  Again we will have to see if we cannot help with MS Contin.  If not, she would probably need a morphine infusion.  She probably would qualify for again CADD pump given that she already has a Port-A-Cath in.  Her family was with her this morning.  Again, they just are not can be able to care for her at home.  She really cannot even move because of the pain.  Again, the hypercalcemia is coming back already.  Clearly, this is an incredibly aggressive renal cell carcinoma.  It is much more aggressive than the typical renal cell that we see.  I think that the hypercalcemia is a indicator of this.  Again, if we get into the [facility] facility, this was certainly be a reasonable option.  I really think that her prognosis is easily less than a  month at this point.  I appreciate the incredible care that she is gotten from all the staff up on [address].  Christin Bach, MD  2 Timothy 4:16-18

## 2021-10-21 NOTE — TOC Progression Note (Addendum)
Transition of Care [facility]) - Progression Note    Patient Details  Name: Terasa Mceuen MRN: [id number] Date of Birth: 1945/07/07  [facility]) CM/SW Contact  Otelia Santee, LCSW Phone Number: 10/21/2021, 10:22 AM  Clinical Narrative:    CSW contacted [facility] with new recommendation for residential hospice placement. Cheri to evaluate this pt for Hospice Home.    Expected Discharge Plan: Home w Home Health Services Barriers to Discharge: Continued Medical Work up  Expected Discharge Plan and Services Expected Discharge Plan: Home w Home Health Services In-house Referral: Clinical Social Work Discharge Planning Services: CM Consult Post Acute Care Choice: Home Health Living arrangements for the past 2 months: Single Family Home                 DME Arranged: Walker rolling DME Agency: [employer] Date DME Agency Contacted: 10/04/21 Time DME Agency Contacted: 1048 Representative spoke with at DME Agency: Duwayne Heck HH Arranged: NA HH Agency: NA Date HH Agency Contacted: 10/04/21 Time HH Agency Contacted: 1300 Representative spoke with at [facility] Agency: Marylene Land   Social Determinants of Health (SDOH) Interventions    Readmission Risk Interventions     No data to display

## 2021-10-22 ENCOUNTER — Encounter: Payer: Self-pay | Admitting: *Deleted

## 2021-10-22 NOTE — Progress Notes (Signed)
Patient discharged from hospital to residential hospice. Will discontinue active navigation.  Oncology Nurse Navigator Documentation     10/22/2021    8:00 AM  Oncology Nurse Navigator Flowsheets  Navigation Complete Date: 10/22/2021  Post Navigation: Continue to Follow Patient? No  Reason Not Navigating Patient: Pharmacologist Encounter Type Appt/Treatment Plan Review;Telephone  Patient Visit Type MedOnc  Treatment Phase Other  Barriers/Navigation Needs No Barriers At This Time  Interventions None Required  Acuity Level 1-No Barriers  Support Groups/Services Friends and Family  Time Spent with Patient 15

## 2021-10-24 ENCOUNTER — Encounter: Payer: Self-pay | Admitting: Radiation Oncology

## 2021-10-24 ENCOUNTER — Ambulatory Visit: Payer: Medicare HMO | Admitting: Radiation Oncology

## 2021-10-24 NOTE — Progress Notes (Signed)
  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 Lisbeth Renshaw had planned to offer a palliative course of radiotherapy to the L2 vertebral body following her osteocool/kyphoplasty procedure. She completed simulation in lieu of this.  Narrative:  She unfortunately continued to decline clinically and became encephalopathic as a result of her cancer, and was discharged from the hospital on 10/21/21 to residential hospice care.  Plan: We will be available as needed for the patient and her family. ________________________________     Carola Rhine, PAC

## 2021-10-25 ENCOUNTER — Ambulatory Visit: Payer: Medicare HMO

## 2021-10-28 ENCOUNTER — Ambulatory Visit: Payer: Medicare HMO | Attending: Radiation Oncology

## 2021-10-30 ENCOUNTER — Ambulatory Visit: Payer: Medicare HMO

## 2021-10-31 ENCOUNTER — Ambulatory Visit: Payer: Medicare HMO

## 2021-11-01 ENCOUNTER — Ambulatory Visit: Payer: Medicare HMO

## 2021-11-04 ENCOUNTER — Ambulatory Visit: Payer: Medicare HMO

## 2021-11-05 ENCOUNTER — Ambulatory Visit: Payer: Medicare HMO

## 2021-11-06 ENCOUNTER — Ambulatory Visit: Payer: Medicare HMO

## 2021-11-06 ENCOUNTER — Telehealth: Payer: Self-pay

## 2021-11-06 ENCOUNTER — Encounter: Payer: Self-pay | Admitting: *Deleted

## 2021-11-06 NOTE — Telephone Encounter (Signed)
Per Care Everywhere, pt expired on 11/07/2021. Dr Marin Olp notified. Flowers ordered. dph

## 2021-11-06 NOTE — Progress Notes (Signed)
Per chart review patient passed away on 11-08-2021. Condolence card sent to daughter Sena Slate.   Oncology Nurse Navigator Documentation     11/06/2021   12:30 PM  Oncology Nurse Navigator Flowsheets  Navigator Location [facility]  Navigator Encounter Type Appt/Treatment Plan Review  Interventions Psycho-Social Support  Support Groups/Services Friends and Family  Time Spent with Patient 15

## 2021-11-07 ENCOUNTER — Ambulatory Visit: Payer: Medicare HMO

## 2021-11-08 ENCOUNTER — Ambulatory Visit: Payer: Medicare HMO

## 2021-11-11 ENCOUNTER — Ambulatory Visit: Payer: Medicare HMO

## 2021-11-12 ENCOUNTER — Ambulatory Visit: Payer: Medicare HMO

## 2021-11-13 ENCOUNTER — Ambulatory Visit: Payer: Medicare HMO

## 2021-11-14 ENCOUNTER — Ambulatory Visit: Payer: Medicare HMO

## 2021-11-26 DEATH — deceased
